# Patient Record
Sex: Male | Born: 1965 | Race: White | Hispanic: No | Marital: Married | Smoking: Former smoker
Health system: Southern US, Community
[De-identification: ages and names within clinical notes are randomized; demographics above are authoritative.]

## PROBLEM LIST (undated history)

## (undated) DIAGNOSIS — G4730 Sleep apnea, unspecified: Secondary | ICD-10-CM

## (undated) DIAGNOSIS — I10 Essential (primary) hypertension: Secondary | ICD-10-CM

## (undated) DIAGNOSIS — E1142 Type 2 diabetes mellitus with diabetic polyneuropathy: Secondary | ICD-10-CM

## (undated) DIAGNOSIS — N401 Enlarged prostate with lower urinary tract symptoms: Secondary | ICD-10-CM

## (undated) DIAGNOSIS — E785 Hyperlipidemia, unspecified: Secondary | ICD-10-CM

## (undated) DIAGNOSIS — M2041 Other hammer toe(s) (acquired), right foot: Secondary | ICD-10-CM

## (undated) DIAGNOSIS — I739 Peripheral vascular disease, unspecified: Secondary | ICD-10-CM

## (undated) DIAGNOSIS — E11628 Type 2 diabetes mellitus with other skin complications: Secondary | ICD-10-CM

## (undated) DIAGNOSIS — N529 Male erectile dysfunction, unspecified: Secondary | ICD-10-CM

## (undated) DIAGNOSIS — E669 Obesity, unspecified: Secondary | ICD-10-CM

## (undated) DIAGNOSIS — I890 Lymphedema, not elsewhere classified: Secondary | ICD-10-CM

## (undated) DIAGNOSIS — M1990 Unspecified osteoarthritis, unspecified site: Secondary | ICD-10-CM

## (undated) DIAGNOSIS — E119 Type 2 diabetes mellitus without complications: Secondary | ICD-10-CM

## (undated) HISTORY — DX: Unspecified osteoarthritis, unspecified site: M19.90

## (undated) HISTORY — DX: Type 2 diabetes mellitus without complications: E11.9

## (undated) HISTORY — DX: Hyperlipidemia, unspecified: E78.5

## (undated) HISTORY — DX: Essential (primary) hypertension: I10

## (undated) HISTORY — PX: VASCULAR SURGERY: SHX849

---

## 2014-12-27 ENCOUNTER — Other Ambulatory Visit: Payer: Self-pay | Admitting: Family Medicine

## 2014-12-27 NOTE — Telephone Encounter (Signed)
Last seen in March was due back in 50months. Forward to [employer] who is covering for [employer]

## 2014-12-27 NOTE — Telephone Encounter (Signed)
Pt needs seen.

## 2015-01-16 DIAGNOSIS — I10 Essential (primary) hypertension: Secondary | ICD-10-CM | POA: Insufficient documentation

## 2015-01-16 DIAGNOSIS — E119 Type 2 diabetes mellitus without complications: Secondary | ICD-10-CM

## 2015-01-16 DIAGNOSIS — E785 Hyperlipidemia, unspecified: Secondary | ICD-10-CM | POA: Insufficient documentation

## 2015-01-16 DIAGNOSIS — E1169 Type 2 diabetes mellitus with other specified complication: Secondary | ICD-10-CM | POA: Insufficient documentation

## 2015-01-17 ENCOUNTER — Ambulatory Visit (INDEPENDENT_AMBULATORY_CARE_PROVIDER_SITE_OTHER): Payer: Managed Care, Other (non HMO) | Admitting: Family Medicine

## 2015-01-17 VITALS — BP 132/81 | HR 68 | Temp 98.3°F | Resp 16 | Ht 72.0 in | Wt 319.0 lb

## 2015-01-17 DIAGNOSIS — I10 Essential (primary) hypertension: Secondary | ICD-10-CM

## 2015-01-17 DIAGNOSIS — E785 Hyperlipidemia, unspecified: Secondary | ICD-10-CM | POA: Diagnosis not present

## 2015-01-17 DIAGNOSIS — E119 Type 2 diabetes mellitus without complications: Secondary | ICD-10-CM

## 2015-01-18 ENCOUNTER — Encounter: Payer: Self-pay | Admitting: Family Medicine

## 2015-01-18 LAB — BAYER DCA HB A1C WAIVED: HB A1C: 6.4 % (ref <7.0)

## 2015-01-18 MED ORDER — CARVEDILOL 25 MG PO TABS: 25.0000 mg | ORAL_TABLET | Freq: Two times a day (BID) | ORAL | Status: DC

## 2015-01-18 MED ORDER — SITAGLIPTIN PHOSPHATE 100 MG PO TABS: 100.0000 mg | ORAL_TABLET | Freq: Every day | ORAL | Status: DC

## 2015-01-18 MED ORDER — BENAZEPRIL HCL 40 MG PO TABS: 40.0000 mg | ORAL_TABLET | Freq: Every day | ORAL | Status: DC

## 2015-01-18 MED ORDER — ATORVASTATIN CALCIUM 20 MG PO TABS: 20.0000 mg | ORAL_TABLET | Freq: Every day | ORAL | Status: DC

## 2015-01-18 MED ORDER — DAPAGLIFLOZIN PROPANEDIOL 10 MG PO TABS: 10.0000 mg | ORAL_TABLET | Freq: Every day | ORAL | Status: DC

## 2015-01-18 MED ORDER — METFORMIN HCL 500 MG PO TABS: 1000.0000 mg | ORAL_TABLET | Freq: Two times a day (BID) | ORAL | Status: DC

## 2015-01-18 MED ORDER — AMLODIPINE BESYLATE 5 MG PO TABS: 5.0000 mg | ORAL_TABLET | Freq: Every day | ORAL | Status: DC

## 2015-01-18 NOTE — Progress Notes (Signed)
   BP 132/81 mmHg  Pulse 68  Temp(Src) 98.3 F (36.8 C)  Resp 16  Ht 6' (1.829 m)  Wt 319 lb (144.697 kg)  BMI 43.25 kg/m2  SpO2 99%   Subjective:    Patient ID: Cody Burke, male    DOB: [birth year], 49 y.o.   MRN: [id number]  HPI: Cody Burke is a 49 y.o. male  No chief complaint on file.  Patient follow-up diabetes complaints noted low blood sugar spells is been doing well no complaints of tinea changes. Blood pressures been doing well Cholesterols been doing well no complaints medications takes faithfully without problems or side effects Relevant past medical, surgical, family and social history reviewed and updated as indicated. Interim medical history since our last visit reviewed. Allergies and medications reviewed and updated.  Review of Systems  Constitutional: Negative.   Respiratory: Negative.   Cardiovascular: Negative.     Per HPI unless specifically indicated above     Objective:    BP 132/81 mmHg  Pulse 68  Temp(Src) 98.3 F (36.8 C)  Resp 16  Ht 6' (1.829 m)  Wt 319 lb (144.697 kg)  BMI 43.25 kg/m2  SpO2 99%  Wt Readings from Last 3 Encounters:  01/18/15 319 lb (144.697 kg)  08/09/14 318 lb (144.244 kg)    Physical Exam  Constitutional: He is oriented to person, place, and time. He appears well-developed and well-nourished. No distress.  HENT:  Head: Normocephalic and atraumatic.  Right Ear: Hearing normal.  Left Ear: Hearing normal.  Nose: Nose normal.  Eyes: Conjunctivae and lids are normal. Right eye exhibits no discharge. Left eye exhibits no discharge. No scleral icterus.  Pulmonary/Chest: Effort normal. No respiratory distress.  Musculoskeletal: Normal range of motion.  Neurological: He is alert and oriented to person, place, and time.  Skin: Skin is intact. No rash noted.  Psychiatric: He has a normal mood and affect. His speech is normal and behavior is normal. Judgment and thought content normal. Cognition and memory are normal.     No results found for this or any previous visit.    Assessment & Plan:   Problem List Items Addressed This Visit      Cardiovascular and Mediastinum   Hypertension - Primary    The current medical regimen is effective;  continue present plan and medications.       Relevant Medications   carvedilol (COREG) 25 MG tablet   benazepril (LOTENSIN) 40 MG tablet   atorvastatin (LIPITOR) 20 MG tablet   amLODipine (NORVASC) 5 MG tablet     Endocrine   Diabetes mellitus without complication    The current medical regimen is effective;  continue present plan and medications.       Relevant Medications   sitaGLIPtin (JANUVIA) 100 MG tablet   metFORMIN (GLUCOPHAGE) 500 MG tablet   dapagliflozin propanediol (FARXIGA) 10 MG TABS tablet   benazepril (LOTENSIN) 40 MG tablet   atorvastatin (LIPITOR) 20 MG tablet     Other   Hyperlipidemia    The current medical regimen is effective;  continue present plan and medications.       Relevant Medications   carvedilol (COREG) 25 MG tablet   benazepril (LOTENSIN) 40 MG tablet   atorvastatin (LIPITOR) 20 MG tablet   amLODipine (NORVASC) 5 MG tablet       Follow up plan: Return in about 3 months (around 04/19/2015) for Physical Exam and a1c.

## 2015-01-18 NOTE — Assessment & Plan Note (Signed)
The current medical regimen is effective;  continue present plan and medications.  

## 2015-01-18 NOTE — Addendum Note (Signed)
Addended byGolden Pop on: 01/18/2015 08:04 AM   Modules accepted: Orders

## 2015-04-06 ENCOUNTER — Ambulatory Visit: Payer: Managed Care, Other (non HMO)

## 2015-04-06 DIAGNOSIS — Z23 Encounter for immunization: Secondary | ICD-10-CM

## 2015-07-30 ENCOUNTER — Ambulatory Visit (INDEPENDENT_AMBULATORY_CARE_PROVIDER_SITE_OTHER): Payer: Managed Care, Other (non HMO) | Admitting: Family Medicine

## 2015-07-30 ENCOUNTER — Encounter: Payer: Self-pay | Admitting: Family Medicine

## 2015-07-30 VITALS — BP 138/86 | HR 69 | Temp 99.0°F | Ht 72.2 in | Wt 312.0 lb

## 2015-07-30 DIAGNOSIS — I10 Essential (primary) hypertension: Secondary | ICD-10-CM | POA: Diagnosis not present

## 2015-07-30 DIAGNOSIS — Z0000 Encounter for general adult medical examination without abnormal findings: Secondary | ICD-10-CM

## 2015-07-30 DIAGNOSIS — R5382 Chronic fatigue, unspecified: Secondary | ICD-10-CM

## 2015-07-30 DIAGNOSIS — E785 Hyperlipidemia, unspecified: Secondary | ICD-10-CM

## 2015-07-30 DIAGNOSIS — E119 Type 2 diabetes mellitus without complications: Secondary | ICD-10-CM

## 2015-07-30 DIAGNOSIS — Z1211 Encounter for screening for malignant neoplasm of colon: Secondary | ICD-10-CM

## 2015-07-30 DIAGNOSIS — R5383 Other fatigue: Secondary | ICD-10-CM | POA: Insufficient documentation

## 2015-07-30 DIAGNOSIS — Z23 Encounter for immunization: Secondary | ICD-10-CM | POA: Diagnosis not present

## 2015-07-30 DIAGNOSIS — Z113 Encounter for screening for infections with a predominantly sexual mode of transmission: Secondary | ICD-10-CM

## 2015-07-30 LAB — URINALYSIS, ROUTINE W REFLEX MICROSCOPIC
Bilirubin, UA: NEGATIVE
Ketones, UA: NEGATIVE
Leukocytes, UA: NEGATIVE
NITRITE UA: NEGATIVE
PH UA: 5 (ref 5.0–7.5)
Protein, UA: NEGATIVE
Specific Gravity, UA: 1.015 (ref 1.005–1.030)
UUROB: 0.2 mg/dL (ref 0.2–1.0)

## 2015-07-30 LAB — MICROSCOPIC EXAMINATION: WBC UA: NONE SEEN /HPF (ref 0–?)

## 2015-07-30 LAB — BAYER DCA HB A1C WAIVED: HB A1C: 6.9 % (ref <7.0)

## 2015-07-30 MED ORDER — ATORVASTATIN CALCIUM 20 MG PO TABS: 20.0000 mg | ORAL_TABLET | Freq: Every day | ORAL | Status: DC

## 2015-07-30 MED ORDER — METFORMIN HCL 500 MG PO TABS: 1000.0000 mg | ORAL_TABLET | Freq: Two times a day (BID) | ORAL | Status: DC

## 2015-07-30 MED ORDER — BENAZEPRIL HCL 40 MG PO TABS: 40.0000 mg | ORAL_TABLET | Freq: Every day | ORAL | Status: DC

## 2015-07-30 NOTE — Progress Notes (Signed)
BP 138/86 mmHg  Pulse 69  Temp(Src) 99 F (37.2 C)  Ht 6' 0.2" (1.834 m)  Wt 312 lb (141.522 kg)  BMI 42.08 kg/m2  SpO2 99%   Subjective:    Patient ID: Cody Burke, male    DOB: 10-Oct-1965, 50 y.o.   MRN: [id number]  HPI: Cody Burke is a 50 y.o. male  Chief Complaint  Patient presents with  . Annual Exam   patient follow-up for physical been doing well concerned about fatigue has been working 7 days a week since November gets limited sleep of around 7 hours at night eats okay Has been working to modify his diet and is lost 7 pounds No problems with medications No issues with medications no low blood sugar spells takes medicines faithfully Concerned about low testosterone may be contributing to his fatigue  Relevant past medical, surgical, family and social history reviewed and updated as indicated. Interim medical history since our last visit reviewed. Allergies and medications reviewed and updated.  Review of Systems  Constitutional: Negative.   HENT: Negative.   Eyes: Negative.   Respiratory: Negative.   Cardiovascular: Negative.   Gastrointestinal: Negative.   Endocrine: Negative.   Genitourinary: Negative.   Musculoskeletal: Negative.   Skin: Negative.   Allergic/Immunologic: Negative.   Neurological: Negative.   Hematological: Negative.   Psychiatric/Behavioral: Negative.     Per HPI unless specifically indicated above     Objective:    BP 138/86 mmHg  Pulse 69  Temp(Src) 99 F (37.2 C)  Ht 6' 0.2" (1.834 m)  Wt 312 lb (141.522 kg)  BMI 42.08 kg/m2  SpO2 99%  Wt Readings from Last 3 Encounters:  07/30/15 312 lb (141.522 kg)  01/18/15 319 lb (144.697 kg)  08/09/14 318 lb (144.244 kg)    Physical Exam  Constitutional: He is oriented to person, place, and time. He appears well-developed and well-nourished.  HENT:  Head: Normocephalic and atraumatic.  Right Ear: External ear normal.  Left Ear: External ear normal.  Eyes: Conjunctivae and EOM  are normal. Pupils are equal, round, and reactive to light.  Neck: Normal range of motion. Neck supple.  Cardiovascular: Normal rate, regular rhythm, normal heart sounds and intact distal pulses.   Pulmonary/Chest: Effort normal and breath sounds normal.  Abdominal: Soft. Bowel sounds are normal. There is no splenomegaly or hepatomegaly.  Genitourinary: Rectum normal, prostate normal and penis normal.  Musculoskeletal: Normal range of motion.  Neurological: He is alert and oriented to person, place, and time. He has normal reflexes.  Skin: No rash noted. No erythema.  Psychiatric: He has a normal mood and affect. His behavior is normal. Judgment and thought content normal.    Results for orders placed or performed in visit on 01/17/15  Bayer DCA Hb A1c Waived  Result Value Ref Range   Bayer DCA Hb A1c Waived 6.4 <7.0 %      Assessment & Plan:   Problem List Items Addressed This Visit      Cardiovascular and Mediastinum   Hypertension    Discussed blood pressure on edge of being okay we'll continue working with diet exercise nutrition weight loss Patient also taking only Coreg discuss switch to Benzapril for renal protection      Relevant Medications   benazepril (LOTENSIN) 40 MG tablet   atorvastatin (LIPITOR) 20 MG tablet     Endocrine   Diabetes mellitus without complication ([place])    Not taking Januvia only taking metformin and [place]  Relevant Medications   benazepril (LOTENSIN) 40 MG tablet   metFORMIN (GLUCOPHAGE) 500 MG tablet   atorvastatin (LIPITOR) 20 MG tablet   Other Relevant Orders   Bayer DCA Hb A1c Waived     Other   Hyperlipidemia    Not taking Lipitor Labs pending      Relevant Medications   benazepril (LOTENSIN) 40 MG tablet   atorvastatin (LIPITOR) 20 MG tablet   Fatigue    Discuss fatigue Discuss adequate sleep Discuss observing for sleep apnea as patient at risk for sleep apnea patient will get his wife to observe for breathing We'll  check testosterone level      Relevant Orders   Testosterone    Other Visit Diagnoses    Routine screening for STI (sexually transmitted infection)    -  Primary    Relevant Orders    Hepatitis C Antibody    Immunization due        Relevant Orders    Tdap vaccine greater than or equal to 7yo IM (Completed)    Colon cancer screening        Relevant Orders    Ambulatory referral to General Surgery    PE (physical exam), annual        Relevant Orders    Comprehensive metabolic panel    Lipid panel    CBC with Differential/Platelet    TSH    Urinalysis, Routine w reflex microscopic (not at [facility])    PSA        Follow up plan: Return in about 3 months (around 10/30/2015) for Med check and hemoglobin A1c.

## 2015-07-30 NOTE — Assessment & Plan Note (Signed)
Discuss fatigue Discuss adequate sleep Discuss observing for sleep apnea as patient at risk for sleep apnea patient will get his wife to observe for breathing We'll check testosterone level

## 2015-07-30 NOTE — Assessment & Plan Note (Signed)
Not taking Januvia only taking metformin and [place]

## 2015-07-30 NOTE — Assessment & Plan Note (Addendum)
Not taking Lipitor Labs pending

## 2015-07-30 NOTE — Assessment & Plan Note (Addendum)
Discussed blood pressure on edge of being okay we'll continue working with diet exercise nutrition weight loss Patient also taking only Coreg discuss switch to Benzapril for renal protection

## 2015-07-31 LAB — CBC WITH DIFFERENTIAL/PLATELET
Basophils Absolute: 0.1 10*3/uL (ref 0.0–0.2)
Basos: 0 %
EOS (ABSOLUTE): 0.3 10*3/uL (ref 0.0–0.4)
Eos: 3 %
HEMATOCRIT: 52.3 % — AB (ref 37.5–51.0)
HEMOGLOBIN: 17.8 g/dL — AB (ref 12.6–17.7)
IMMATURE GRANS (ABS): 0.1 10*3/uL (ref 0.0–0.1)
IMMATURE GRANULOCYTES: 1 %
Lymphocytes Absolute: 4.7 10*3/uL — ABNORMAL HIGH (ref 0.7–3.1)
Lymphs: 41 %
MCH: 30.1 pg (ref 26.6–33.0)
MCHC: 34 g/dL (ref 31.5–35.7)
MCV: 88 fL (ref 79–97)
MONOCYTES: 7 %
MONOS ABS: 0.9 10*3/uL (ref 0.1–0.9)
NEUTROS ABS: 5.5 10*3/uL (ref 1.4–7.0)
Neutrophils: 48 %
Platelets: 230 10*3/uL (ref 150–379)
RBC: 5.92 x10E6/uL — ABNORMAL HIGH (ref 4.14–5.80)
RDW: 14.3 % (ref 12.3–15.4)
WBC: 11.6 10*3/uL — AB (ref 3.4–10.8)

## 2015-07-31 LAB — LIPID PANEL
CHOL/HDL RATIO: 4.7 ratio (ref 0.0–5.0)
Cholesterol, Total: 179 mg/dL (ref 100–199)
HDL: 38 mg/dL — AB (ref >39)
LDL CALC: 94 mg/dL (ref 0–99)
TRIGLYCERIDES: 237 mg/dL — AB (ref 0–149)
VLDL Cholesterol Cal: 47 mg/dL — ABNORMAL HIGH (ref 5–40)

## 2015-07-31 LAB — TESTOSTERONE: TESTOSTERONE: 249 ng/dL — AB (ref 348–1197)

## 2015-07-31 LAB — COMPREHENSIVE METABOLIC PANEL
ALT: 35 IU/L (ref 0–44)
AST: 22 IU/L (ref 0–40)
Albumin/Globulin Ratio: 1.4 (ref 1.1–2.5)
Albumin: 4.1 g/dL (ref 3.5–5.5)
Alkaline Phosphatase: 102 IU/L (ref 39–117)
BUN/Creatinine Ratio: 18 (ref 9–20)
BUN: 16 mg/dL (ref 6–24)
Bilirubin Total: 0.4 mg/dL (ref 0.0–1.2)
CO2: 22 mmol/L (ref 18–29)
CREATININE: 0.91 mg/dL (ref 0.76–1.27)
Calcium: 9 mg/dL (ref 8.7–10.2)
Chloride: 98 mmol/L (ref 96–106)
GFR calc Af Amer: 112 mL/min/{1.73_m2} (ref >59)
GFR, EST NON AFRICAN AMERICAN: 97 mL/min/{1.73_m2} (ref >59)
GLOBULIN, TOTAL: 2.9 g/dL (ref 1.5–4.5)
Glucose: 155 mg/dL — ABNORMAL HIGH (ref 65–99)
Potassium: 4.9 mmol/L (ref 3.5–5.2)
SODIUM: 139 mmol/L (ref 134–144)
Total Protein: 7 g/dL (ref 6.0–8.5)

## 2015-07-31 LAB — TSH: TSH: 2.41 u[IU]/mL (ref 0.450–4.500)

## 2015-07-31 LAB — HEPATITIS C ANTIBODY: Hep C Virus Ab: <0.1 s/co ratio (ref 0.0–0.9)

## 2015-07-31 LAB — PSA: Prostate Specific Ag, Serum: <0.1 ng/mL (ref 0.0–4.0)

## 2015-08-01 ENCOUNTER — Telehealth: Payer: Self-pay | Admitting: Family Medicine

## 2015-08-01 DIAGNOSIS — D582 Other hemoglobinopathies: Secondary | ICD-10-CM

## 2015-08-01 NOTE — Telephone Encounter (Signed)
Phone call Discussed with patient elevated blood count patient nonsmoker no other pulmonary type toxicities On further review about sleep apnea with patient patient does have classic signs and symptoms of sleep apnea will arrange for sleep apnea evaluation And chest x-ray

## 2015-08-01 NOTE — Telephone Encounter (Signed)
-----   Message from Wynn Maudlin, [place] sent at 08/01/2015  5:14 PM EST ----- labs

## 2015-08-01 NOTE — Telephone Encounter (Signed)
Pt would like a back. He stated that he had missed phone calls and was concerned. He would like a call back a little after 12 or after 3:30(he gets off work). Thanks

## 2015-08-02 ENCOUNTER — Ambulatory Visit
Admission: RE | Admit: 2015-08-02 | Discharge: 2015-08-02 | Disposition: A | Discharge status: Home / Self Care | Payer: Managed Care, Other (non HMO) | Source: Ambulatory Visit | Attending: Family Medicine | Admitting: Family Medicine

## 2015-08-02 DIAGNOSIS — D582 Other hemoglobinopathies: Secondary | ICD-10-CM

## 2015-08-03 ENCOUNTER — Other Ambulatory Visit: Payer: Self-pay

## 2015-08-03 ENCOUNTER — Telehealth: Payer: Self-pay

## 2015-08-03 NOTE — Telephone Encounter (Signed)
Gastroenterology Pre-Procedure Review  Request Date: 09/21/15 Requesting Physician: Dr. Jeananne Rama  PATIENT REVIEW QUESTIONS: The patient responded to the following health history questions as indicated:    1. Are you having any GI issues? no 2. Do you have a personal history of Polyps? no 3. Do you have a family history of Colon Cancer or Polyps? no 4. Diabetes Mellitus? no 5. Joint replacements in the past 12 months?no 6. Major health problems in the past 3 months?no 7. Any artificial heart valves, MVP, or defibrillator?no    MEDICATIONS & ALLERGIES:    Patient reports the following regarding taking any anticoagulation/antiplatelet therapy:   Plavix, Coumadin, Eliquis, Xarelto, Lovenox, Pradaxa, Brilinta, or Effient? no Aspirin? no  Patient confirms/reports the following medications:  Current Outpatient Prescriptions  Medication Sig Dispense Refill  . aspirin EC 81 MG tablet Take 81 mg by mouth daily.    Marland Kitchen atorvastatin (LIPITOR) 20 MG tablet Take 1 tablet (20 mg total) by mouth daily. 90 tablet 4  . benazepril (LOTENSIN) 40 MG tablet Take 1 tablet (40 mg total) by mouth daily. 90 tablet 4  . dapagliflozin propanediol (FARXIGA) 10 MG TABS tablet Take 10 mg by mouth daily. 30 tablet 12  . metFORMIN (GLUCOPHAGE) 500 MG tablet Take 2 tablets (1,000 mg total) by mouth 2 (two) times daily with a meal. 360 tablet 4   No current facility-administered medications for this visit.    Patient confirms/reports the following allergies:  No Known Allergies  No orders of the defined types were placed in this encounter.    AUTHORIZATION INFORMATION Primary Insurance: 1D#: Group #:  Secondary Insurance: 1D#: Group #:  SCHEDULE INFORMATION: Date: 09/21/15 Time: Location: [place]

## 2015-08-06 ENCOUNTER — Encounter: Payer: Self-pay | Admitting: Family Medicine

## 2015-09-03 ENCOUNTER — Telehealth: Payer: Self-pay

## 2015-09-03 NOTE — Telephone Encounter (Signed)
Call patient about Sleep Study

## 2015-09-03 NOTE — Telephone Encounter (Signed)
Phone call Discussed with patient need diagnosis of severe sleep apnea Patient will need follow-up sleep titration study will schedule.

## 2015-09-14 ENCOUNTER — Encounter: Payer: Self-pay | Admitting: *Deleted

## 2015-09-20 NOTE — Discharge Instructions (Signed)

## 2015-09-21 ENCOUNTER — Ambulatory Visit
Admission: RE | Admit: 2015-09-21 | Discharge: 2015-09-21 | Disposition: A | Discharge status: Home / Self Care | Payer: Managed Care, Other (non HMO) | Source: Ambulatory Visit | Attending: Gastroenterology | Admitting: Gastroenterology

## 2015-09-21 ENCOUNTER — Ambulatory Visit: Payer: Managed Care, Other (non HMO) | Admitting: Anesthesiology

## 2015-09-21 ENCOUNTER — Encounter
Admission: RE | Disposition: A | Discharge status: Home / Self Care | Payer: Self-pay | Source: Ambulatory Visit | Attending: Gastroenterology

## 2015-09-21 DIAGNOSIS — Z79899 Other long term (current) drug therapy: Secondary | ICD-10-CM | POA: Insufficient documentation

## 2015-09-21 DIAGNOSIS — Z8249 Family history of ischemic heart disease and other diseases of the circulatory system: Secondary | ICD-10-CM | POA: Insufficient documentation

## 2015-09-21 DIAGNOSIS — K641 Second degree hemorrhoids: Secondary | ICD-10-CM | POA: Diagnosis not present

## 2015-09-21 DIAGNOSIS — Z7982 Long term (current) use of aspirin: Secondary | ICD-10-CM | POA: Insufficient documentation

## 2015-09-21 DIAGNOSIS — D123 Benign neoplasm of transverse colon: Secondary | ICD-10-CM | POA: Diagnosis not present

## 2015-09-21 DIAGNOSIS — Z7984 Long term (current) use of oral hypoglycemic drugs: Secondary | ICD-10-CM | POA: Insufficient documentation

## 2015-09-21 DIAGNOSIS — E785 Hyperlipidemia, unspecified: Secondary | ICD-10-CM | POA: Diagnosis not present

## 2015-09-21 DIAGNOSIS — E119 Type 2 diabetes mellitus without complications: Secondary | ICD-10-CM | POA: Diagnosis not present

## 2015-09-21 DIAGNOSIS — Z1211 Encounter for screening for malignant neoplasm of colon: Secondary | ICD-10-CM

## 2015-09-21 DIAGNOSIS — Z87891 Personal history of nicotine dependence: Secondary | ICD-10-CM | POA: Diagnosis not present

## 2015-09-21 DIAGNOSIS — I10 Essential (primary) hypertension: Secondary | ICD-10-CM | POA: Diagnosis not present

## 2015-09-21 DIAGNOSIS — G4730 Sleep apnea, unspecified: Secondary | ICD-10-CM | POA: Insufficient documentation

## 2015-09-21 DIAGNOSIS — I739 Peripheral vascular disease, unspecified: Secondary | ICD-10-CM | POA: Diagnosis not present

## 2015-09-21 HISTORY — DX: Sleep apnea, unspecified: G47.30

## 2015-09-21 HISTORY — PX: COLONOSCOPY WITH PROPOFOL: SHX5780

## 2015-09-21 HISTORY — DX: Peripheral vascular disease, unspecified: I73.9

## 2015-09-21 LAB — GLUCOSE, CAPILLARY: GLUCOSE-CAPILLARY: 138 mg/dL — AB (ref 65–99)

## 2015-09-21 SURGERY — COLONOSCOPY WITH PROPOFOL
Anesthesia: Monitor Anesthesia Care

## 2015-09-21 MED ORDER — LIDOCAINE HCL (CARDIAC) 20 MG/ML IV SOLN
INTRAVENOUS | Status: DC | PRN
  Administered 2015-09-21: 50 mg via INTRAVENOUS

## 2015-09-21 MED ORDER — LACTATED RINGERS IV SOLN
INTRAVENOUS | Status: DC
  Administered 2015-09-21: 08:00:00 via INTRAVENOUS

## 2015-09-21 MED ORDER — PROPOFOL 10 MG/ML IV BOLUS
INTRAVENOUS | Status: DC | PRN
  Administered 2015-09-21 (×5): 50 mg via INTRAVENOUS
  Administered 2015-09-21: 100 mg via INTRAVENOUS

## 2015-09-21 MED ORDER — STERILE WATER FOR IRRIGATION IR SOLN
Status: DC | PRN
  Administered 2015-09-21: 08:00:00

## 2015-09-21 SURGICAL SUPPLY — 22 items
CANISTER SUCT 1200ML W/VALVE (MISCELLANEOUS) ×3 IMPLANT
CLIP HMST 235XBRD CATH ROT (MISCELLANEOUS) IMPLANT
CLIP RESOLUTION 360 11X235 (MISCELLANEOUS)
FCP ESCP3.2XJMB 240X2.8X (MISCELLANEOUS)
FORCEPS BIOP RAD 4 LRG CAP 4 (CUTTING FORCEPS) IMPLANT
FORCEPS BIOP RJ4 240 W/NDL (MISCELLANEOUS)
FORCEPS ESCP3.2XJMB 240X2.8X (MISCELLANEOUS) IMPLANT
GOWN CVR UNV OPN BCK APRN NK (MISCELLANEOUS) ×2 IMPLANT
GOWN ISOL THUMB LOOP REG UNIV (MISCELLANEOUS) ×4
INJECTOR VARIJECT VIN23 (MISCELLANEOUS) IMPLANT
KIT DEFENDO VALVE AND CONN (KITS) IMPLANT
KIT ENDO PROCEDURE OLY (KITS) ×3 IMPLANT
MARKER SPOT ENDO TATTOO 5ML (MISCELLANEOUS) IMPLANT
PAD GROUND ADULT SPLIT (MISCELLANEOUS) IMPLANT
PROBE APC STR FIRE (PROBE) IMPLANT
SNARE SHORT THROW 13M SML OVAL (MISCELLANEOUS) IMPLANT
SNARE SHORT THROW 30M LRG OVAL (MISCELLANEOUS) ×3 IMPLANT
SNARE SNG USE RND 15MM (INSTRUMENTS) IMPLANT
SPOT EX ENDOSCOPIC TATTOO (MISCELLANEOUS)
TRAP ETRAP POLY (MISCELLANEOUS) IMPLANT
VARIJECT INJECTOR VIN23 (MISCELLANEOUS)
WATER STERILE IRR 250ML POUR (IV SOLUTION) ×3 IMPLANT

## 2015-09-21 NOTE — Transfer of Care (Signed)
Immediate Anesthesia Transfer of Care Note  Patient: Cody Burke  Procedure(s) Performed: Procedure(s) with comments: COLONOSCOPY WITH PROPOFOL (N/A) - Diabetic - oral meds sleep apnea  Patient Location: PACU  Anesthesia Type: MAC  Level of Consciousness: awake, alert  and patient cooperative  Airway and Oxygen Therapy: Patient Spontanous Breathing and Patient connected to supplemental oxygen  Post-op Assessment: Post-op Vital signs reviewed, Patient's Cardiovascular Status Stable, Respiratory Function Stable, Patent Airway and No signs of Nausea or vomiting  Post-op Vital Signs: Reviewed and stable  Complications: No apparent anesthesia complications

## 2015-09-21 NOTE — H&P (Signed)
  [facility]  [address], [place], [place] [address] Phone: [phone] Fax : [phone]  Primary Care Physician:  Golden Pop, MD Primary Gastroenterologist:  Dr. Allen Norris  Pre-Procedure History & Physical: HPI:  Cody Burke is a 50 y.o. male is here for a screening colonoscopy.   Past Medical History  Diagnosis Date  . Diabetes mellitus without complication ([place])   . Hyperlipidemia   . Hypertension   . Sleep apnea     severe - new DX - has not gotten CPAP yet  . Peripheral vascular disease [facility])     Past Surgical History  Procedure Laterality Date  . Vascular surgery Bilateral     legs    Prior to Admission medications   Medication Sig Start Date End Date Taking? Authorizing Provider  aspirin EC 81 MG tablet Take 81 mg by mouth daily.   Yes Historical Provider, MD  atorvastatin (LIPITOR) 20 MG tablet Take 1 tablet (20 mg total) by mouth daily. 07/30/15  Yes Guadalupe Maple, MD  benazepril (LOTENSIN) 40 MG tablet Take 1 tablet (40 mg total) by mouth daily. 07/30/15  Yes Guadalupe Maple, MD  dapagliflozin propanediol (FARXIGA) 10 MG TABS tablet Take 10 mg by mouth daily. 01/18/15  Yes Guadalupe Maple, MD  metFORMIN (GLUCOPHAGE) 500 MG tablet Take 2 tablets (1,000 mg total) by mouth 2 (two) times daily with a meal. 07/30/15  Yes Guadalupe Maple, MD  Multiple Vitamins-Minerals (MULTIVITAMIN GUMMIES ADULT PO) Take by mouth daily.   Yes Historical Provider, MD    Allergies as of 08/03/2015  . (No Known Allergies)    Family History  Problem Relation Age of Onset  . Cancer Mother   . Hypertension Father     Social History   Social History  . Marital Status: Unknown    Spouse Name: N/A  . Number of Children: N/A  . Years of Education: N/A   Occupational History  . Not on file.   Social History Main Topics  . Smoking status: Former Smoker    Types: Cigarettes    Quit date: 07/30/2003  . Smokeless tobacco: Never Used  . Alcohol Use: Yes   Comment: very little  . Drug Use: No  . Sexual Activity: Not on file   Other Topics Concern  . Not on file   Social History Narrative    Review of Systems: See HPI, otherwise negative ROS  Physical Exam: BP 148/94 mmHg  Pulse 70  Temp(Src) 97.7 F (36.5 C) (Temporal)  Ht 6\' 3"  (1.905 m)  Wt 310 lb (140.615 kg)  BMI 38.75 kg/m2  SpO2 98% General:   Alert,  pleasant and cooperative in NAD Head:  Normocephalic and atraumatic. Neck:  Supple; no masses or thyromegaly. Lungs:  Clear throughout to auscultation.    Heart:  Regular rate and rhythm. Abdomen:  Soft, nontender and nondistended. Normal bowel sounds, without guarding, and without rebound.   Neurologic:  Alert and  oriented x4;  grossly normal neurologically.  Impression/Plan: Cody Burke is now here to undergo a screening colonoscopy.  Risks, benefits, and alternatives regarding colonoscopy have been reviewed with the patient.  Questions have been answered.  All parties agreeable.

## 2015-09-21 NOTE — Anesthesia Postprocedure Evaluation (Signed)
Anesthesia Post Note  Patient: Cody Burke  Procedure(s) Performed: Procedure(s) (LRB): COLONOSCOPY WITH PROPOFOL (N/A)  Patient location during evaluation: PACU Anesthesia Type: MAC Level of consciousness: awake and alert and oriented Pain management: pain level controlled Vital Signs Assessment: post-procedure vital signs reviewed and stable Respiratory status: spontaneous breathing and nonlabored ventilation Cardiovascular status: stable Postop Assessment: no signs of nausea or vomiting and adequate PO intake Anesthetic complications: no    Estill Batten

## 2015-09-21 NOTE — Anesthesia Preprocedure Evaluation (Signed)
Anesthesia Evaluation  Patient identified by MRN, date of birth, ID band Patient awake    Reviewed: Allergy & Precautions, NPO status , Patient's Chart, lab work & pertinent test results  Airway Mallampati: II  TM Distance: >3 FB Neck ROM: Full    Dental no notable dental hx.    Pulmonary sleep apnea , former smoker,    Pulmonary exam normal        Cardiovascular hypertension, + Peripheral Vascular Disease  Normal cardiovascular exam     Neuro/Psych    GI/Hepatic negative GI ROS, Neg liver ROS,   Endo/Other  diabetes, Type 2, Oral Hypoglycemic Agents  Renal/GU      Musculoskeletal   Abdominal   Peds  Hematology negative hematology ROS (+)   Anesthesia Other Findings   Reproductive/Obstetrics                             Anesthesia Physical Anesthesia Plan  ASA: II  Anesthesia Plan: MAC   Post-op Pain Management:    Induction: Intravenous  Airway Management Planned:   Additional Equipment:   Intra-op Plan:   Post-operative Plan:   Informed Consent: I have reviewed the patients History and Physical, chart, labs and discussed the procedure including the risks, benefits and alternatives for the proposed anesthesia with the patient or authorized representative who has indicated his/her understanding and acceptance.     Plan Discussed with: CRNA  Anesthesia Plan Comments:         Anesthesia Quick Evaluation

## 2015-09-21 NOTE — Op Note (Signed)
[facility] Patient Name: Cody Burke Procedure Date: 09/21/2015 7:41 AM MRN: [id number] Account #: [id number] Date of Birth: March 20, 1966 Admit Type: Outpatient Age: 50 Room: [facility] OR ROOM 01 Gender: Male Note Status: Finalized Procedure:            Colonoscopy Indications:          Screening for colorectal malignant neoplasm Providers:            Lucilla Lame, MD Referring MD:         Guadalupe Maple, MD (Referring MD) Medicines:            Propofol per Anesthesia Complications:        No immediate complications. Procedure:            Pre-Anesthesia Assessment:                       - Prior to the procedure, a History and Physical was                        performed, and patient medications and allergies were                        reviewed. The patient's tolerance of previous                        anesthesia was also reviewed. The risks and benefits of                        the procedure and the sedation options and risks were                        discussed with the patient. All questions were                        answered, and informed consent was obtained. Prior                        Anticoagulants: The patient has taken no previous                        anticoagulant or antiplatelet agents. ASA Grade                        Assessment: II - A patient with mild systemic disease.                        After reviewing the risks and benefits, the patient was                        deemed in satisfactory condition to undergo the                        procedure.                       After obtaining informed consent, the colonoscope was                        passed under direct vision. Throughout the procedure,  the patient's blood pressure, pulse, and oxygen                        saturations were monitored continuously. The Olympus                        CF-HQ190L Colonoscope (S#. [id number] [id number]) was introduced               through the anus and advanced to the the cecum,                        identified by appendiceal orifice and ileocecal valve.                        The colonoscopy was performed without difficulty. The                        patient tolerated the procedure well. The quality of                        the bowel preparation was fair. Findings:      The perianal and digital rectal examinations were normal.      A 4 mm polyp was found in the transverse colon. The polyp was sessile.       The polyp was removed with a cold snare. Resection and retrieval were       complete.      Non-bleeding internal hemorrhoids were found during retroflexion. The       hemorrhoids were Grade II (internal hemorrhoids that prolapse but reduce       spontaneously). Impression:           - Preparation of the colon was fair.                       - One 4 mm polyp in the transverse colon, removed with                        a cold snare. Resected and retrieved.                       - Non-bleeding internal hemorrhoids. Recommendation:       - Await pathology results.                       - Repeat colonoscopy in 5 years if polyp adenoma and 10                        years if hyperplastic Procedure Code(s):    --- Professional ---                       [phone], Colonoscopy, flexible; with removal of tumor(s),                        polyp(s), or other lesion(s) by snare technique Diagnosis Code(s):    --- Professional ---                       Z12.11, Encounter for screening for malignant neoplasm  of colon                       D12.3, Benign neoplasm of transverse colon (hepatic                        flexure or splenic flexure) CPT copyright 2016 American Medical Association. All rights reserved. The codes documented in this report are preliminary and upon coder review may  be revised to meet current compliance requirements. Lucilla Lame, MD 09/21/2015 8:07:05 AM This report has  been signed electronically. Number of Addenda: 0 Note Initiated On: 09/21/2015 7:41 AM Scope Withdrawal Time: 0 hours 8 minutes 5 seconds  Total Procedure Duration: 0 hours 16 minutes 36 seconds       [facility]

## 2015-09-24 ENCOUNTER — Encounter: Payer: Self-pay | Admitting: Gastroenterology

## 2015-09-25 ENCOUNTER — Encounter: Payer: Self-pay | Admitting: Gastroenterology

## 2015-10-31 ENCOUNTER — Ambulatory Visit (INDEPENDENT_AMBULATORY_CARE_PROVIDER_SITE_OTHER): Payer: Managed Care, Other (non HMO) | Admitting: Family Medicine

## 2015-10-31 ENCOUNTER — Encounter: Payer: Self-pay | Admitting: Family Medicine

## 2015-10-31 VITALS — BP 116/77 | HR 81 | Temp 98.5°F | Ht 72.24 in | Wt 314.0 lb

## 2015-10-31 DIAGNOSIS — I10 Essential (primary) hypertension: Secondary | ICD-10-CM

## 2015-10-31 DIAGNOSIS — E119 Type 2 diabetes mellitus without complications: Secondary | ICD-10-CM

## 2015-10-31 DIAGNOSIS — E785 Hyperlipidemia, unspecified: Secondary | ICD-10-CM | POA: Diagnosis not present

## 2015-10-31 LAB — BAYER DCA HB A1C WAIVED: HB A1C (BAYER DCA - WAIVED): 7.1 % — ABNORMAL HIGH (ref <7.0)

## 2015-10-31 NOTE — Assessment & Plan Note (Signed)
The current medical regimen is effective;  continue present plan and medications.  

## 2015-10-31 NOTE — Progress Notes (Signed)
   BP 116/77 mmHg  Pulse 81  Temp(Src) 98.5 F (36.9 C)  Ht 6' 0.24" (1.835 m)  Wt 314 lb (142.429 kg)  BMI 42.30 kg/m2  SpO2 95%   Subjective:    Patient ID: Cody Burke, male    DOB: [birth year], 50 y.o.   MRN: [id number]  HPI: Cody Burke is a 50 y.o. male  Chief Complaint  Patient presents with  . Diabetes  Patient recheck diabetes has been eating ice cream and gained weight note low blood sugar spells no issues with medications but taking metformin in the morning not in the evening as he doesn't remember his evening metformin. Blood pressure doing well no complaints taking medicines faithfully Cholesterol doing well  Relevant past medical, surgical, family and social history reviewed and updated as indicated. Interim medical history since our last visit reviewed. Allergies and medications reviewed and updated.  Review of Systems  Constitutional: Negative.   Respiratory: Negative.   Cardiovascular: Negative.     Per HPI unless specifically indicated above     Objective:    BP 116/77 mmHg  Pulse 81  Temp(Src) 98.5 F (36.9 C)  Ht 6' 0.24" (1.835 m)  Wt 314 lb (142.429 kg)  BMI 42.30 kg/m2  SpO2 95%  Wt Readings from Last 3 Encounters:  10/31/15 314 lb (142.429 kg)  09/21/15 310 lb (140.615 kg)  07/30/15 312 lb (141.522 kg)    Physical Exam  Constitutional: He is oriented to person, place, and time. He appears well-developed and well-nourished. No distress.  HENT:  Head: Normocephalic and atraumatic.  Right Ear: Hearing normal.  Left Ear: Hearing normal.  Nose: Nose normal.  Eyes: Conjunctivae and lids are normal. Right eye exhibits no discharge. Left eye exhibits no discharge. No scleral icterus.  Cardiovascular: Normal rate, regular rhythm and normal heart sounds.   Pulmonary/Chest: Effort normal and breath sounds normal. No respiratory distress.  Musculoskeletal: Normal range of motion.  Neurological: He is alert and oriented to person, place, and time.   Skin: Skin is intact. No rash noted.  Psychiatric: He has a normal mood and affect. His speech is normal and behavior is normal. Judgment and thought content normal. Cognition and memory are normal.    Results for orders placed or performed in visit on 10/31/15  Bayer DCA Hb A1c Waived  Result Value Ref Range   Bayer DCA Hb A1c Waived 7.1 (H) <7.0 %      Assessment & Plan:   Problem List Items Addressed This Visit      Cardiovascular and Mediastinum   Hypertension    The current medical regimen is effective;  continue present plan and medications.         Endocrine   Diabetes mellitus without complication ([place]) - Primary    Discuss worsening control with worsening diet. Patient will redouble dietary efforts cut out ice cream start metformin in the evening      Relevant Orders   Bayer DCA Hb A1c Waived (Completed)     Other   Hyperlipidemia    The current medical regimen is effective;  continue present plan and medications.           Follow up plan: Return in about 3 months (around 01/31/2016) for a1c, lipids, alt, ast, bmp.

## 2015-10-31 NOTE — Assessment & Plan Note (Signed)
Discuss worsening control with worsening diet. Patient will redouble dietary efforts cut out ice cream start metformin in the evening

## 2016-01-31 ENCOUNTER — Encounter: Payer: Self-pay | Admitting: Family Medicine

## 2016-01-31 ENCOUNTER — Ambulatory Visit (INDEPENDENT_AMBULATORY_CARE_PROVIDER_SITE_OTHER): Payer: Managed Care, Other (non HMO) | Admitting: Family Medicine

## 2016-01-31 VITALS — BP 136/85 | HR 81 | Temp 98.2°F | Wt 314.0 lb

## 2016-01-31 DIAGNOSIS — E119 Type 2 diabetes mellitus without complications: Secondary | ICD-10-CM | POA: Diagnosis not present

## 2016-01-31 DIAGNOSIS — Z6841 Body Mass Index (BMI) 40.0 and over, adult: Secondary | ICD-10-CM | POA: Diagnosis not present

## 2016-01-31 DIAGNOSIS — I10 Essential (primary) hypertension: Secondary | ICD-10-CM

## 2016-01-31 DIAGNOSIS — E785 Hyperlipidemia, unspecified: Secondary | ICD-10-CM | POA: Diagnosis not present

## 2016-01-31 DIAGNOSIS — Z23 Encounter for immunization: Secondary | ICD-10-CM | POA: Diagnosis not present

## 2016-01-31 LAB — MICROALBUMIN, URINE WAIVED
Creatinine, Urine Waived: 50 mg/dL (ref 10–300)
Microalb, Ur Waived: 10 mg/L (ref 0–19)
Microalb/Creat Ratio: <30 mg/g (ref <30)

## 2016-01-31 LAB — BAYER DCA HB A1C WAIVED: HB A1C (BAYER DCA - WAIVED): 7.3 % — ABNORMAL HIGH (ref <7.0)

## 2016-01-31 LAB — LP+ALT+AST PICCOLO, WAIVED
ALT (SGPT) Piccolo, Waived: 37 U/L (ref 10–47)
AST (SGOT) Piccolo, Waived: 37 U/L (ref 11–38)
CHOL/HDL RATIO PICCOLO,WAIVE: 2.8 mg/dL
CHOLESTEROL PICCOLO, WAIVED: 124 mg/dL (ref <200)
HDL CHOL PICCOLO, WAIVED: 44 mg/dL — AB (ref >59)
LDL CHOL CALC PICCOLO WAIVED: 34 mg/dL (ref <100)
TRIGLYCERIDES PICCOLO,WAIVED: 231 mg/dL — AB (ref <150)
VLDL Chol Calc Piccolo,Waive: 46 mg/dL — ABNORMAL HIGH (ref <30)

## 2016-01-31 MED ORDER — DAPAGLIFLOZIN PROPANEDIOL 10 MG PO TABS: 10.0000 mg | ORAL_TABLET | Freq: Every day | ORAL | 12 refills | Status: DC

## 2016-01-31 NOTE — Assessment & Plan Note (Signed)
The current medical regimen is effective;  continue present plan and medications.  

## 2016-01-31 NOTE — Progress Notes (Signed)
BP 136/85 (BP Location: Left Arm, Patient Position: Sitting, Cuff Size: Normal)   Pulse 81   Temp 98.2 F (36.8 C)   Wt (!) 314 lb (142.4 kg) Comment: with shoes  SpO2 97%   BMI 42.30 kg/m    Subjective:    Patient ID: Cody Burke, male    DOB: [birth year], 50 y.o.   MRN: [id number]  HPI: Cody Burke is a 50 y.o. male  Chief Complaint  Patient presents with  . Diabetes  . Hypertension  . Hyperlipidemia  Patient all in all doing well no issues low blood sugar spells side effects from medications taking medications faithfully. Has questions about over-the-counter supplement reviewed supplement questions about [employer] Which seems to be okay on review of ingredients through Wikipedia Patient will consider doing a free trial  Relevant past medical, surgical, family and social history reviewed and updated as indicated. Interim medical history since our last visit reviewed. Allergies and medications reviewed and updated.  Review of Systems  Constitutional: Negative.   Respiratory: Negative.   Cardiovascular: Negative.     Per HPI unless specifically indicated above     Objective:    BP 136/85 (BP Location: Left Arm, Patient Position: Sitting, Cuff Size: Normal)   Pulse 81   Temp 98.2 F (36.8 C)   Wt (!) 314 lb (142.4 kg) Comment: with shoes  SpO2 97%   BMI 42.30 kg/m   Wt Readings from Last 3 Encounters:  01/31/16 (!) 314 lb (142.4 kg)  10/31/15 (!) 314 lb (142.4 kg)  09/21/15 (!) 310 lb (140.6 kg)    Physical Exam  Constitutional: He is oriented to person, place, and time. He appears well-developed and well-nourished. No distress.  HENT:  Head: Normocephalic and atraumatic.  Right Ear: Hearing normal.  Left Ear: Hearing normal.  Nose: Nose normal.  Eyes: Conjunctivae and lids are normal. Right eye exhibits no discharge. Left eye exhibits no discharge. No scleral icterus.  Cardiovascular: Normal rate, regular rhythm and normal heart sounds.   Pulmonary/Chest:  Effort normal and breath sounds normal. No respiratory distress.  Musculoskeletal: Normal range of motion.  Neurological: He is alert and oriented to person, place, and time.  Skin: Skin is intact. No rash noted.  Psychiatric: He has a normal mood and affect. His speech is normal and behavior is normal. Judgment and thought content normal. Cognition and memory are normal.    Results for orders placed or performed in visit on 10/31/15  Bayer DCA Hb A1c Waived  Result Value Ref Range   Bayer DCA Hb A1c Waived 7.1 (H) <7.0 %      Assessment & Plan:   Problem List Items Addressed This Visit      Cardiovascular and Mediastinum   Hypertension    The current medical regimen is effective;  continue present plan and medications.       Relevant Orders   Bayer DCA Hb A1c Waived   LP+ALT+AST Piccolo, Waived   Basic metabolic panel   Microalbumin, Urine Waived     Endocrine   Diabetes mellitus without complication ([place]) - Primary    Discussed A1c going up Discussed weight stable and need for doing better with lifestyle modification which would help enable weight loss which would be the primary treatment for worsening diabetes. Of course continuing current medications      Relevant Medications   dapagliflozin propanediol (FARXIGA) 10 MG TABS tablet   Other Relevant Orders   Bayer DCA Hb A1c Waived   LP+ALT+AST  Piccolo, Waived   Basic Industrial/product designer, Urine Waived     Other   Hyperlipidemia    The current medical regimen is effective;  continue present plan and medications.       Relevant Orders   Bayer DCA Hb A1c Waived   LP+ALT+AST Piccolo, Waived   Basic metabolic panel   Microalbumin, Urine Waived   BMI 40.0-44.9, adult (HCC)    Discussed wt loss diet exercise       Relevant Medications   dapagliflozin propanediol (FARXIGA) 10 MG TABS tablet    Other Visit Diagnoses    Immunization due       Relevant Orders   Flu Vaccine QUAD 36+ mos PF IM (Fluarix  & Fluzone Quad PF) (Completed)       Follow up plan: Return in about 3 months (around 05/01/2016) for Hemoglobin A1c.

## 2016-01-31 NOTE — Assessment & Plan Note (Addendum)
Discussed A1c going up Discussed weight stable and need for doing better with lifestyle modification which would help enable weight loss which would be the primary treatment for worsening diabetes. Of course continuing current medications

## 2016-01-31 NOTE — Assessment & Plan Note (Signed)
Discussed wt loss diet exercise

## 2016-02-01 LAB — BASIC METABOLIC PANEL
BUN/Creatinine Ratio: 13 (ref 9–20)
BUN: 12 mg/dL (ref 6–24)
CALCIUM: 9.2 mg/dL (ref 8.7–10.2)
CO2: 25 mmol/L (ref 18–29)
Chloride: 97 mmol/L (ref 96–106)
Creatinine, Ser: 0.9 mg/dL (ref 0.76–1.27)
GFR calc Af Amer: 114 mL/min/{1.73_m2} (ref >59)
GFR, EST NON AFRICAN AMERICAN: 99 mL/min/{1.73_m2} (ref >59)
GLUCOSE: 122 mg/dL — AB (ref 65–99)
Potassium: 4.2 mmol/L (ref 3.5–5.2)
SODIUM: 135 mmol/L (ref 134–144)

## 2016-02-03 ENCOUNTER — Encounter: Payer: Self-pay | Admitting: Family Medicine

## 2016-05-01 ENCOUNTER — Encounter: Payer: Self-pay | Admitting: Family Medicine

## 2016-05-01 ENCOUNTER — Ambulatory Visit (INDEPENDENT_AMBULATORY_CARE_PROVIDER_SITE_OTHER): Payer: Managed Care, Other (non HMO) | Admitting: Family Medicine

## 2016-05-01 VITALS — BP 143/87 | HR 103 | Temp 98.8°F | Ht 72.0 in | Wt 314.8 lb

## 2016-05-01 DIAGNOSIS — I10 Essential (primary) hypertension: Secondary | ICD-10-CM

## 2016-05-01 DIAGNOSIS — G4730 Sleep apnea, unspecified: Secondary | ICD-10-CM | POA: Insufficient documentation

## 2016-05-01 DIAGNOSIS — G4733 Obstructive sleep apnea (adult) (pediatric): Secondary | ICD-10-CM | POA: Diagnosis not present

## 2016-05-01 DIAGNOSIS — E7800 Pure hypercholesterolemia, unspecified: Secondary | ICD-10-CM

## 2016-05-01 DIAGNOSIS — E119 Type 2 diabetes mellitus without complications: Secondary | ICD-10-CM

## 2016-05-01 MED ORDER — SITAGLIPTIN PHOSPHATE 100 MG PO TABS: 100.0000 mg | ORAL_TABLET | Freq: Every day | ORAL | 3 refills | Status: DC

## 2016-05-01 NOTE — Assessment & Plan Note (Signed)
Patient also has had sleep study indicating sleep apnea due to a lot of insurance hassles and confusions between in network and out of network has been unable to get his CPAP machine. On further review and evaluation unable to find sleep study. We sent for a copy which is being faxed today. We will then check with insurance to see what we need to do.

## 2016-05-01 NOTE — Assessment & Plan Note (Signed)
The current medical regimen is effective;  continue present plan and medications.  

## 2016-05-01 NOTE — Progress Notes (Signed)
BP (!) 143/87 (BP Location: Left Arm, Patient Position: Sitting, Cuff Size: Large)   Pulse (!) 103   Temp 98.8 F (37.1 C)   Ht 6' (1.829 m)   Wt (!) 314 lb 12.8 oz (142.8 kg)   SpO2 96%   BMI 42.69 kg/m    Subjective:    Patient ID: Cody Burke, male    DOB: [birth year], 50 y.o.   MRN: [id number]  HPI: Cody Burke is a 50 y.o. male  Chief Complaint  Patient presents with  . Diabetes  Pt follow-up diabetes has struggled with lifestyle and weight loss. He is really had a good month with birthdays and holidays with eating too much including some extra cake. Also sometimes struggles with remembering nighttime metformin. Discussed okay to take it whenever he remembers it. Blood pressure no issues taking medications faithfully without problems Cholesterol no issues again taking medicines faithfully without problems. And 4 glucose no low blood sugar spells.  Patient also has had sleep study indicating sleep apnea due to a lot of insurance hassles and confusions between in network and out of network has been unable to get his CPAP machine. On further review and evaluation unable to find sleep study. We sent for a copy which is being faxed today. We will then check with insurance to see what we need to do.  Relevant past medical, surgical, family and social history reviewed and updated as indicated. Interim medical history since our last visit reviewed. Allergies and medications reviewed and updated.  Review of Systems  Constitutional: Negative.   Respiratory: Negative.   Cardiovascular: Negative.     Per HPI unless specifically indicated above     Objective:    BP (!) 143/87 (BP Location: Left Arm, Patient Position: Sitting, Cuff Size: Large)   Pulse (!) 103   Temp 98.8 F (37.1 C)   Ht 6' (1.829 m)   Wt (!) 314 lb 12.8 oz (142.8 kg)   SpO2 96%   BMI 42.69 kg/m   Wt Readings from Last 3 Encounters:  05/01/16 (!) 314 lb 12.8 oz (142.8 kg)  01/31/16 (!) 314 lb (142.4 kg)    10/31/15 (!) 314 lb (142.4 kg)    Physical Exam  Constitutional: He is oriented to person, place, and time. He appears well-developed and well-nourished. No distress.  HENT:  Head: Normocephalic and atraumatic.  Right Ear: Hearing normal.  Left Ear: Hearing normal.  Nose: Nose normal.  Eyes: Conjunctivae and lids are normal. Right eye exhibits no discharge. Left eye exhibits no discharge. No scleral icterus.  Cardiovascular: Normal rate, regular rhythm and normal heart sounds.   Pulmonary/Chest: Effort normal and breath sounds normal. No respiratory distress.  Musculoskeletal: Normal range of motion.  Neurological: He is alert and oriented to person, place, and time.  Skin: Skin is intact. No rash noted.  Psychiatric: He has a normal mood and affect. His speech is normal and behavior is normal. Judgment and thought content normal. Cognition and memory are normal.    Results for orders placed or performed in visit on 01/31/16  Bayer DCA Hb A1c Waived  Result Value Ref Range   Bayer DCA Hb A1c Waived 7.3 (H) <7.0 %  LP+ALT+AST Piccolo, Waived  Result Value Ref Range   ALT (SGPT) Piccolo, Waived 37 10 - 47 U/L   AST (SGOT) Piccolo, Waived 37 11 - 38 U/L   Cholesterol Piccolo, Waived 124 <200 mg/dL   HDL Chol Piccolo, Waived 44 (L) >59 mg/dL  Triglycerides Piccolo,Waived 231 (H) <150 mg/dL   Chol/HDL Ratio Piccolo,Waive 2.8 mg/dL   LDL Chol Calc Piccolo Waived 34 <100 mg/dL   VLDL Chol Calc Piccolo,Waive 46 (H) <30 mg/dL  Basic metabolic panel  Result Value Ref Range   Glucose 122 (H) 65 - 99 mg/dL   BUN 12 6 - 24 mg/dL   Creatinine, Ser 0.90 0.76 - 1.27 mg/dL   GFR calc non Af Amer 99 >59 mL/min/1.73   GFR calc Af Amer 114 >59 mL/min/1.73   BUN/Creatinine Ratio 13 9 - 20   Sodium 135 134 - 144 mmol/L   Potassium 4.2 3.5 - 5.2 mmol/L   Chloride 97 96 - 106 mmol/L   CO2 25 18 - 29 mmol/L   Calcium 9.2 8.7 - 10.2 mg/dL  Microalbumin, Urine Waived  Result Value Ref Range    Microalb, Ur Waived 10 0 - 19 mg/L   Creatinine, Urine Waived 50 10 - 300 mg/dL   Microalb/Creat Ratio <30 <30 mg/g      Assessment & Plan:   Problem List Items Addressed This Visit      Cardiovascular and Mediastinum   Hypertension    The current medical regimen is effective;  continue present plan and medications.         Respiratory   Sleep apnea    Patient also has had sleep study indicating sleep apnea due to a lot of insurance hassles and confusions between in network and out of network has been unable to get his CPAP machine. On further review and evaluation unable to find sleep study. We sent for a copy which is being faxed today. We will then check with insurance to see what we need to do.        Endocrine   Diabetes mellitus without complication ([place]) - Primary    Continue current medications will add Januvia.      Relevant Medications   sitaGLIPtin (JANUVIA) 100 MG tablet   Other Relevant Orders   Bayer DCA Hb A1c Waived     Other   Hyperlipidemia    The current medical regimen is effective;  continue present plan and medications.           Follow up plan: Return in about 3 months (around 07/30/2016) for BMP,  Lipids, ALT, AST, Hemoglobin A1c.

## 2016-05-01 NOTE — Assessment & Plan Note (Signed)
Continue current medications will add Januvia.

## 2016-05-02 LAB — BAYER DCA HB A1C WAIVED: HB A1C (BAYER DCA - WAIVED): 7.9 % — ABNORMAL HIGH (ref <7.0)

## 2016-05-24 ENCOUNTER — Other Ambulatory Visit: Payer: Self-pay | Admitting: Family Medicine

## 2016-05-27 NOTE — Telephone Encounter (Signed)
Last OV: 05/01/16 Next OV: 07/24/16  A1C 7.9% 05/01/16

## 2016-07-24 ENCOUNTER — Ambulatory Visit (INDEPENDENT_AMBULATORY_CARE_PROVIDER_SITE_OTHER): Payer: Managed Care, Other (non HMO) | Admitting: Family Medicine

## 2016-07-24 ENCOUNTER — Encounter: Payer: Self-pay | Admitting: Family Medicine

## 2016-07-24 VITALS — BP 132/78 | HR 89 | Ht 72.0 in | Wt 311.0 lb

## 2016-07-24 DIAGNOSIS — I10 Essential (primary) hypertension: Secondary | ICD-10-CM | POA: Diagnosis not present

## 2016-07-24 DIAGNOSIS — E119 Type 2 diabetes mellitus without complications: Secondary | ICD-10-CM | POA: Diagnosis not present

## 2016-07-24 DIAGNOSIS — E7800 Pure hypercholesterolemia, unspecified: Secondary | ICD-10-CM | POA: Diagnosis not present

## 2016-07-24 MED ORDER — DAPAGLIFLOZIN PROPANEDIOL 10 MG PO TABS: 10.0000 mg | ORAL_TABLET | Freq: Every day | ORAL | 3 refills | Status: DC

## 2016-07-24 MED ORDER — BENAZEPRIL HCL 40 MG PO TABS: 40.0000 mg | ORAL_TABLET | Freq: Every day | ORAL | 4 refills | Status: DC

## 2016-07-24 MED ORDER — SITAGLIPTIN PHOSPHATE 100 MG PO TABS: 100.0000 mg | ORAL_TABLET | Freq: Every day | ORAL | 3 refills | Status: DC

## 2016-07-24 MED ORDER — METFORMIN HCL 500 MG PO TABS: 1000.0000 mg | ORAL_TABLET | Freq: Two times a day (BID) | ORAL | 4 refills | Status: DC

## 2016-07-24 MED ORDER — ATORVASTATIN CALCIUM 20 MG PO TABS: 20.0000 mg | ORAL_TABLET | Freq: Every day | ORAL | 4 refills | Status: DC

## 2016-07-24 NOTE — Progress Notes (Signed)
BP 132/78 (BP Location: Left Arm)   Pulse 89   Ht 6' (1.829 m)   Wt (!) 311 lb (141.1 kg)   SpO2 97%   BMI 42.18 kg/m    Subjective:    Patient ID: Cody Burke, male    DOB: 04/06/1966, 51 y.o.   MRN: [id number]  HPI: Cody Burke is a 51 y.o. male  Chief Complaint  Patient presents with  . Follow-up  . Diabetes   Patient all in all doing well had an episode of stomach bug but got through that and is unable to lose a couple of pounds. No low blood sugar spells all in all doing well. Diabetes medicines doing well. Same with Benzapril and atorvastatin for cholesterol and blood pressure. Relevant past medical, surgical, family and social history reviewed and updated as indicated. Interim medical history since our last visit reviewed. Allergies and medications reviewed and updated.  Review of Systems  Constitutional: Negative.   Respiratory: Negative.   Cardiovascular: Negative.     Per HPI unless specifically indicated above     Objective:    BP 132/78 (BP Location: Left Arm)   Pulse 89   Ht 6' (1.829 m)   Wt (!) 311 lb (141.1 kg)   SpO2 97%   BMI 42.18 kg/m   Wt Readings from Last 3 Encounters:  07/24/16 (!) 311 lb (141.1 kg)  05/01/16 (!) 314 lb 12.8 oz (142.8 kg)  01/31/16 (!) 314 lb (142.4 kg)    Physical Exam  Constitutional: He is oriented to person, place, and time. He appears well-developed and well-nourished.  HENT:  Head: Normocephalic and atraumatic.  Eyes: Conjunctivae and EOM are normal.  Neck: Normal range of motion.  Cardiovascular: Normal rate, regular rhythm and normal heart sounds.   Pulmonary/Chest: Effort normal and breath sounds normal.  Musculoskeletal: Normal range of motion.  Neurological: He is alert and oriented to person, place, and time.  Skin: No erythema.  Psychiatric: He has a normal mood and affect. His behavior is normal. Judgment and thought content normal.    Results for orders placed or performed in visit on 05/01/16    Bayer DCA Hb A1c Waived  Result Value Ref Range   Bayer DCA Hb A1c Waived 7.9 (H) <7.0 %      Assessment & Plan:   Problem List Items Addressed This Visit      Cardiovascular and Mediastinum   Hypertension - Primary    The current medical regimen is effective;  continue present plan and medications.       Relevant Medications   benazepril (LOTENSIN) 40 MG tablet   atorvastatin (LIPITOR) 20 MG tablet   Other Relevant Orders   Basic metabolic panel   Bayer DCA Hb A1c Waived   LP+ALT+AST Piccolo, Waived     Endocrine   Diabetes mellitus without complication ([place])    The current medical regimen is effective;  continue present plan and medications.       Relevant Medications   metFORMIN (GLUCOPHAGE) 500 MG tablet   benazepril (LOTENSIN) 40 MG tablet   atorvastatin (LIPITOR) 20 MG tablet   sitaGLIPtin (JANUVIA) 100 MG tablet   dapagliflozin propanediol (FARXIGA) 10 MG TABS tablet   Other Relevant Orders   Basic metabolic panel   Bayer DCA Hb A1c Waived   LP+ALT+AST Piccolo, Waived     Other   Hyperlipidemia    The current medical regimen is effective;  continue present plan and medications.  Relevant Medications   benazepril (LOTENSIN) 40 MG tablet   atorvastatin (LIPITOR) 20 MG tablet   Other Relevant Orders   Basic metabolic panel   Bayer DCA Hb A1c Waived   LP+ALT+AST Piccolo, Waived       Follow up plan: Return in about 3 months (around 10/24/2016) for Physical Exam, Hemoglobin A1c.

## 2016-07-24 NOTE — Assessment & Plan Note (Signed)
The current medical regimen is effective;  continue present plan and medications.  

## 2016-07-25 ENCOUNTER — Encounter: Payer: Self-pay | Admitting: Family Medicine

## 2016-07-25 LAB — BASIC METABOLIC PANEL
BUN/Creatinine Ratio: 14 (ref 9–20)
BUN: 13 mg/dL (ref 6–24)
CHLORIDE: 99 mmol/L (ref 96–106)
CO2: 21 mmol/L (ref 18–29)
Calcium: 8.7 mg/dL (ref 8.7–10.2)
Creatinine, Ser: 0.93 mg/dL (ref 0.76–1.27)
GFR, EST AFRICAN AMERICAN: 109 mL/min/{1.73_m2} (ref >59)
GFR, EST NON AFRICAN AMERICAN: 94 mL/min/{1.73_m2} (ref >59)
Glucose: 160 mg/dL — ABNORMAL HIGH (ref 65–99)
POTASSIUM: 4.1 mmol/L (ref 3.5–5.2)
SODIUM: 136 mmol/L (ref 134–144)

## 2016-07-25 LAB — LP+ALT+AST PICCOLO, WAIVED
ALT (SGPT) Piccolo, Waived: 46 U/L (ref 10–47)
AST (SGOT) Piccolo, Waived: 34 U/L (ref 11–38)
CHOL/HDL RATIO PICCOLO,WAIVE: 3.1 mg/dL
Cholesterol Piccolo, Waived: 115 mg/dL (ref <200)
HDL Chol Piccolo, Waived: 37 mg/dL — ABNORMAL LOW (ref >59)
LDL CHOL CALC PICCOLO WAIVED: 32 mg/dL (ref <100)
TRIGLYCERIDES PICCOLO,WAIVED: 229 mg/dL — AB (ref <150)
VLDL CHOL CALC PICCOLO,WAIVE: 46 mg/dL — AB (ref <30)

## 2016-07-25 LAB — BAYER DCA HB A1C WAIVED: HB A1C (BAYER DCA - WAIVED): 7.5 % — ABNORMAL HIGH (ref <7.0)

## 2016-11-06 ENCOUNTER — Encounter: Payer: Managed Care, Other (non HMO) | Admitting: Family Medicine

## 2016-11-24 ENCOUNTER — Encounter: Payer: Self-pay | Admitting: Family Medicine

## 2016-11-24 ENCOUNTER — Ambulatory Visit (INDEPENDENT_AMBULATORY_CARE_PROVIDER_SITE_OTHER): Payer: Managed Care, Other (non HMO) | Admitting: Family Medicine

## 2016-11-24 VITALS — BP 138/84 | HR 80 | Ht 73.72 in | Wt 306.0 lb

## 2016-11-24 DIAGNOSIS — I10 Essential (primary) hypertension: Secondary | ICD-10-CM | POA: Diagnosis not present

## 2016-11-24 DIAGNOSIS — E119 Type 2 diabetes mellitus without complications: Secondary | ICD-10-CM | POA: Diagnosis not present

## 2016-11-24 DIAGNOSIS — G4733 Obstructive sleep apnea (adult) (pediatric): Secondary | ICD-10-CM

## 2016-11-24 DIAGNOSIS — E7800 Pure hypercholesterolemia, unspecified: Secondary | ICD-10-CM

## 2016-11-24 DIAGNOSIS — Z125 Encounter for screening for malignant neoplasm of prostate: Secondary | ICD-10-CM

## 2016-11-24 DIAGNOSIS — Z1329 Encounter for screening for other suspected endocrine disorder: Secondary | ICD-10-CM | POA: Diagnosis not present

## 2016-11-24 DIAGNOSIS — Z0000 Encounter for general adult medical examination without abnormal findings: Secondary | ICD-10-CM | POA: Diagnosis not present

## 2016-11-24 LAB — URINALYSIS, ROUTINE W REFLEX MICROSCOPIC
Bilirubin, UA: NEGATIVE
KETONES UA: NEGATIVE
LEUKOCYTES UA: NEGATIVE
Nitrite, UA: NEGATIVE
Protein, UA: NEGATIVE
SPEC GRAV UA: 1.02 (ref 1.005–1.030)
Urobilinogen, Ur: 0.2 mg/dL (ref 0.2–1.0)
pH, UA: 5 (ref 5.0–7.5)

## 2016-11-24 LAB — MICROSCOPIC EXAMINATION
BACTERIA UA: NONE SEEN
RBC MICROSCOPIC, UA: NONE SEEN /HPF (ref 0–?)

## 2016-11-24 LAB — BAYER DCA HB A1C WAIVED: HB A1C (BAYER DCA - WAIVED): 6.5 % (ref <7.0)

## 2016-11-24 MED ORDER — BENAZEPRIL HCL 40 MG PO TABS: 40.0000 mg | ORAL_TABLET | Freq: Every day | ORAL | 4 refills | Status: DC

## 2016-11-24 MED ORDER — METFORMIN HCL 500 MG PO TABS: 1000.0000 mg | ORAL_TABLET | Freq: Two times a day (BID) | ORAL | 4 refills | Status: DC

## 2016-11-24 MED ORDER — DAPAGLIFLOZIN PROPANEDIOL 10 MG PO TABS: 10.0000 mg | ORAL_TABLET | Freq: Every day | ORAL | 3 refills | Status: DC

## 2016-11-24 MED ORDER — SITAGLIPTIN PHOSPHATE 100 MG PO TABS: 100.0000 mg | ORAL_TABLET | Freq: Every day | ORAL | 4 refills | Status: DC

## 2016-11-24 MED ORDER — ATORVASTATIN CALCIUM 20 MG PO TABS: 20.0000 mg | ORAL_TABLET | Freq: Every day | ORAL | 4 refills | Status: DC

## 2016-11-24 NOTE — Assessment & Plan Note (Signed)
The current medical regimen is effective;  continue present plan and medications.  

## 2016-11-24 NOTE — Progress Notes (Signed)
BP 138/84 (BP Location: Left Arm)   Pulse 80   Ht 6' 1.72" (1.872 m)   Wt (!) 306 lb (138.8 kg)   SpO2 98%   BMI 39.59 kg/m    Subjective:    Patient ID: Cody Burke, male    DOB: [birth year], 51 y.o.   MRN: [id number]  HPI: Cody Burke is a 51 y.o. male  Chief Complaint  Patient presents with  . Annual Exam   Patient follow-up doing well with cholesterol blood pressure diabetes no issues with medications no low blood sugar spells has even lost a little bit of weight this year so far. Still hasn't been able to work through [facility] issues for getting his CPAP and the second CPAP study. Patient will try again checking with his insurance on when to get his follow-up sleep study and evaluation for CPAP machine.  Relevant past medical, surgical, family and social history reviewed and updated as indicated. Interim medical history since our last visit reviewed. Allergies and medications reviewed and updated.  Review of Systems  Constitutional: Negative.   HENT: Negative.   Eyes: Negative.   Respiratory: Negative.   Cardiovascular: Negative.   Gastrointestinal: Negative.   Endocrine: Negative.   Genitourinary: Negative.   Musculoskeletal: Negative.        Patient was some left shoulder discomfort some limitation range of motion and some generalized weakness. Does not wake at night but will take an occasional aspirin.  Skin: Negative.   Allergic/Immunologic: Negative.   Neurological: Negative.   Hematological: Negative.   Psychiatric/Behavioral: Negative.     Per HPI unless specifically indicated above     Objective:    BP 138/84 (BP Location: Left Arm)   Pulse 80   Ht 6' 1.72" (1.872 m)   Wt (!) 306 lb (138.8 kg)   SpO2 98%   BMI 39.59 kg/m   Wt Readings from Last 3 Encounters:  11/24/16 (!) 306 lb (138.8 kg)  07/24/16 (!) 311 lb (141.1 kg)  05/01/16 (!) 314 lb 12.8 oz (142.8 kg)    Physical Exam  Constitutional: He is oriented to person, place, and  time. He appears well-developed and well-nourished.  HENT:  Head: Normocephalic and atraumatic.  Right Ear: External ear normal.  Left Ear: External ear normal.  Eyes: Conjunctivae and EOM are normal. Pupils are equal, round, and reactive to light.  Neck: Normal range of motion. Neck supple.  Cardiovascular: Normal rate, regular rhythm, normal heart sounds and intact distal pulses.   Pulmonary/Chest: Effort normal and breath sounds normal.  Abdominal: Soft. Bowel sounds are normal. There is no splenomegaly or hepatomegaly.  Genitourinary: Rectum normal, prostate normal and penis normal.  Musculoskeletal: Normal range of motion.  Neurological: He is alert and oriented to person, place, and time. He has normal reflexes.  Skin: No rash noted. No erythema.  Psychiatric: He has a normal mood and affect. His behavior is normal. Judgment and thought content normal.    Results for orders placed or performed in visit on 46/56/81  Basic metabolic panel  Result Value Ref Range   Glucose 160 (H) 65 - 99 mg/dL   BUN 13 6 - 24 mg/dL   Creatinine, Ser 0.93 0.76 - 1.27 mg/dL   GFR calc non Af Amer 94 >59 mL/min/1.73   GFR calc Af Amer 109 >59 mL/min/1.73   BUN/Creatinine Ratio 14 9 - 20   Sodium 136 134 - 144 mmol/L   Potassium 4.1 3.5 - 5.2 mmol/L   Chloride  99 96 - 106 mmol/L   CO2 21 18 - 29 mmol/L   Calcium 8.7 8.7 - 10.2 mg/dL  Bayer DCA Hb A1c Waived  Result Value Ref Range   Bayer DCA Hb A1c Waived 7.5 (H) <7.0 %  LP+ALT+AST Piccolo, Waived  Result Value Ref Range   ALT (SGPT) Piccolo, Waived 46 10 - 47 U/L   AST (SGOT) Piccolo, Waived 34 11 - 38 U/L   Cholesterol Piccolo, Waived 115 <200 mg/dL   HDL Chol Piccolo, Waived 37 (L) >59 mg/dL   Triglycerides Piccolo,Waived 229 (H) <150 mg/dL   Chol/HDL Ratio Piccolo,Waive 3.1 mg/dL   LDL Chol Calc Piccolo Waived 32 <100 mg/dL   VLDL Chol Calc Piccolo,Waive 46 (H) <30 mg/dL      Assessment & Plan:   Problem List Items Addressed This  Visit      Cardiovascular and Mediastinum   Hypertension    The current medical regimen is effective;  continue present plan and medications.       Relevant Medications   atorvastatin (LIPITOR) 20 MG tablet   benazepril (LOTENSIN) 40 MG tablet   Other Relevant Orders   CBC with Differential/Platelet   Comprehensive metabolic panel   Urinalysis, Routine w reflex microscopic   Bayer DCA Hb A1c Waived     Respiratory   Sleep apnea    Discussed still hasn't had follow-up CPAP study gave patient print out of this current study and will get back with [employer] on finding out proper place for getting his follow-up CPAP titration trial. Encouraged patient to call at least twice a week until resolution.        Endocrine   Diabetes mellitus without complication ([facility]CC)   Relevant Medications   atorvastatin (LIPITOR) 20 MG tablet   benazepril (LOTENSIN) 40 MG tablet   dapagliflozin propanediol (FARXIGA) 10 MG TABS tablet   metFORMIN (GLUCOPHAGE) 500 MG tablet   sitaGLIPtin (JANUVIA) 100 MG tablet   Other Relevant Orders   CBC with Differential/Platelet   Comprehensive metabolic panel   Urinalysis, Routine w reflex microscopic   Bayer DCA Hb A1c Waived     Other   Hyperlipidemia    The current medical regimen is effective;  continue present plan and medications.       Relevant Medications   atorvastatin (LIPITOR) 20 MG tablet   benazepril (LOTENSIN) 40 MG tablet   Other Relevant Orders   CBC with Differential/Platelet   Comprehensive metabolic panel   Lipid panel   Bayer DCA Hb A1c Waived    Other Visit Diagnoses    Annual physical exam    -  Primary   Prostate cancer screening       Relevant Orders   PSA   Thyroid disorder screen       Relevant Orders   TSH       Follow up plan: Return in about 3 months (around 02/24/2017) for Hemoglobin A1c.

## 2016-11-24 NOTE — Assessment & Plan Note (Signed)
Discussed still hasn't had follow-up CPAP study gave patient print out of this current study and will get back with [employer] on finding out proper place for getting his follow-up CPAP titration trial. Encouraged patient to call at least twice a week until resolution.

## 2016-11-25 ENCOUNTER — Encounter: Payer: Self-pay | Admitting: Family Medicine

## 2016-11-25 LAB — LIPID PANEL
CHOLESTEROL TOTAL: 133 mg/dL (ref 100–199)
Chol/HDL Ratio: 2.7 ratio (ref 0.0–5.0)
HDL: 49 mg/dL (ref >39)
LDL Calculated: 51 mg/dL (ref 0–99)
Triglycerides: 167 mg/dL — ABNORMAL HIGH (ref 0–149)
VLDL CHOLESTEROL CAL: 33 mg/dL (ref 5–40)

## 2016-11-25 LAB — CBC WITH DIFFERENTIAL/PLATELET
BASOS ABS: 0.1 10*3/uL (ref 0.0–0.2)
BASOS: 1 %
EOS (ABSOLUTE): 0.4 10*3/uL (ref 0.0–0.4)
Eos: 4 %
HEMATOCRIT: 52.5 % — AB (ref 37.5–51.0)
HEMOGLOBIN: 17.6 g/dL (ref 13.0–17.7)
IMMATURE GRANS (ABS): 0.1 10*3/uL (ref 0.0–0.1)
Immature Granulocytes: 1 %
LYMPHS ABS: 4.5 10*3/uL — AB (ref 0.7–3.1)
Lymphs: 44 %
MCH: 29.9 pg (ref 26.6–33.0)
MCHC: 33.5 g/dL (ref 31.5–35.7)
MCV: 89 fL (ref 79–97)
MONOCYTES: 8 %
Monocytes Absolute: 0.8 10*3/uL (ref 0.1–0.9)
Neutrophils Absolute: 4.2 10*3/uL (ref 1.4–7.0)
Neutrophils: 42 %
Platelets: 214 10*3/uL (ref 150–379)
RBC: 5.89 x10E6/uL — ABNORMAL HIGH (ref 4.14–5.80)
RDW: 13.6 % (ref 12.3–15.4)
WBC: 10 10*3/uL (ref 3.4–10.8)

## 2016-11-25 LAB — COMPREHENSIVE METABOLIC PANEL
A/G RATIO: 1.5 (ref 1.2–2.2)
ALBUMIN: 4.3 g/dL (ref 3.5–5.5)
ALK PHOS: 97 IU/L (ref 39–117)
ALT: 38 IU/L (ref 0–44)
AST: 24 IU/L (ref 0–40)
BILIRUBIN TOTAL: 0.3 mg/dL (ref 0.0–1.2)
BUN / CREAT RATIO: 15 (ref 9–20)
BUN: 13 mg/dL (ref 6–24)
CHLORIDE: 99 mmol/L (ref 96–106)
CO2: 23 mmol/L (ref 20–29)
Calcium: 9.4 mg/dL (ref 8.7–10.2)
Creatinine, Ser: 0.86 mg/dL (ref 0.76–1.27)
GFR calc non Af Amer: 100 mL/min/{1.73_m2} (ref >59)
GFR, EST AFRICAN AMERICAN: 115 mL/min/{1.73_m2} (ref >59)
GLOBULIN, TOTAL: 2.8 g/dL (ref 1.5–4.5)
GLUCOSE: 146 mg/dL — AB (ref 65–99)
POTASSIUM: 4.8 mmol/L (ref 3.5–5.2)
SODIUM: 139 mmol/L (ref 134–144)
TOTAL PROTEIN: 7.1 g/dL (ref 6.0–8.5)

## 2016-11-25 LAB — TSH: TSH: 2.11 u[IU]/mL (ref 0.450–4.500)

## 2016-11-25 LAB — PSA

## 2017-02-25 IMAGING — CR DG CHEST 2V
1 series · 3 of 3 positions shown · non-contrast
Comparison: None

CLINICAL DATA: Elevated hemoglobin and hematocrit, dry cough for 1
week, former smoker, hypertension

EXAM:
CHEST  2 VIEW

[Series 1: pa · 0.17mm/px · 3 of 3 slices shown]
[im 1/3]
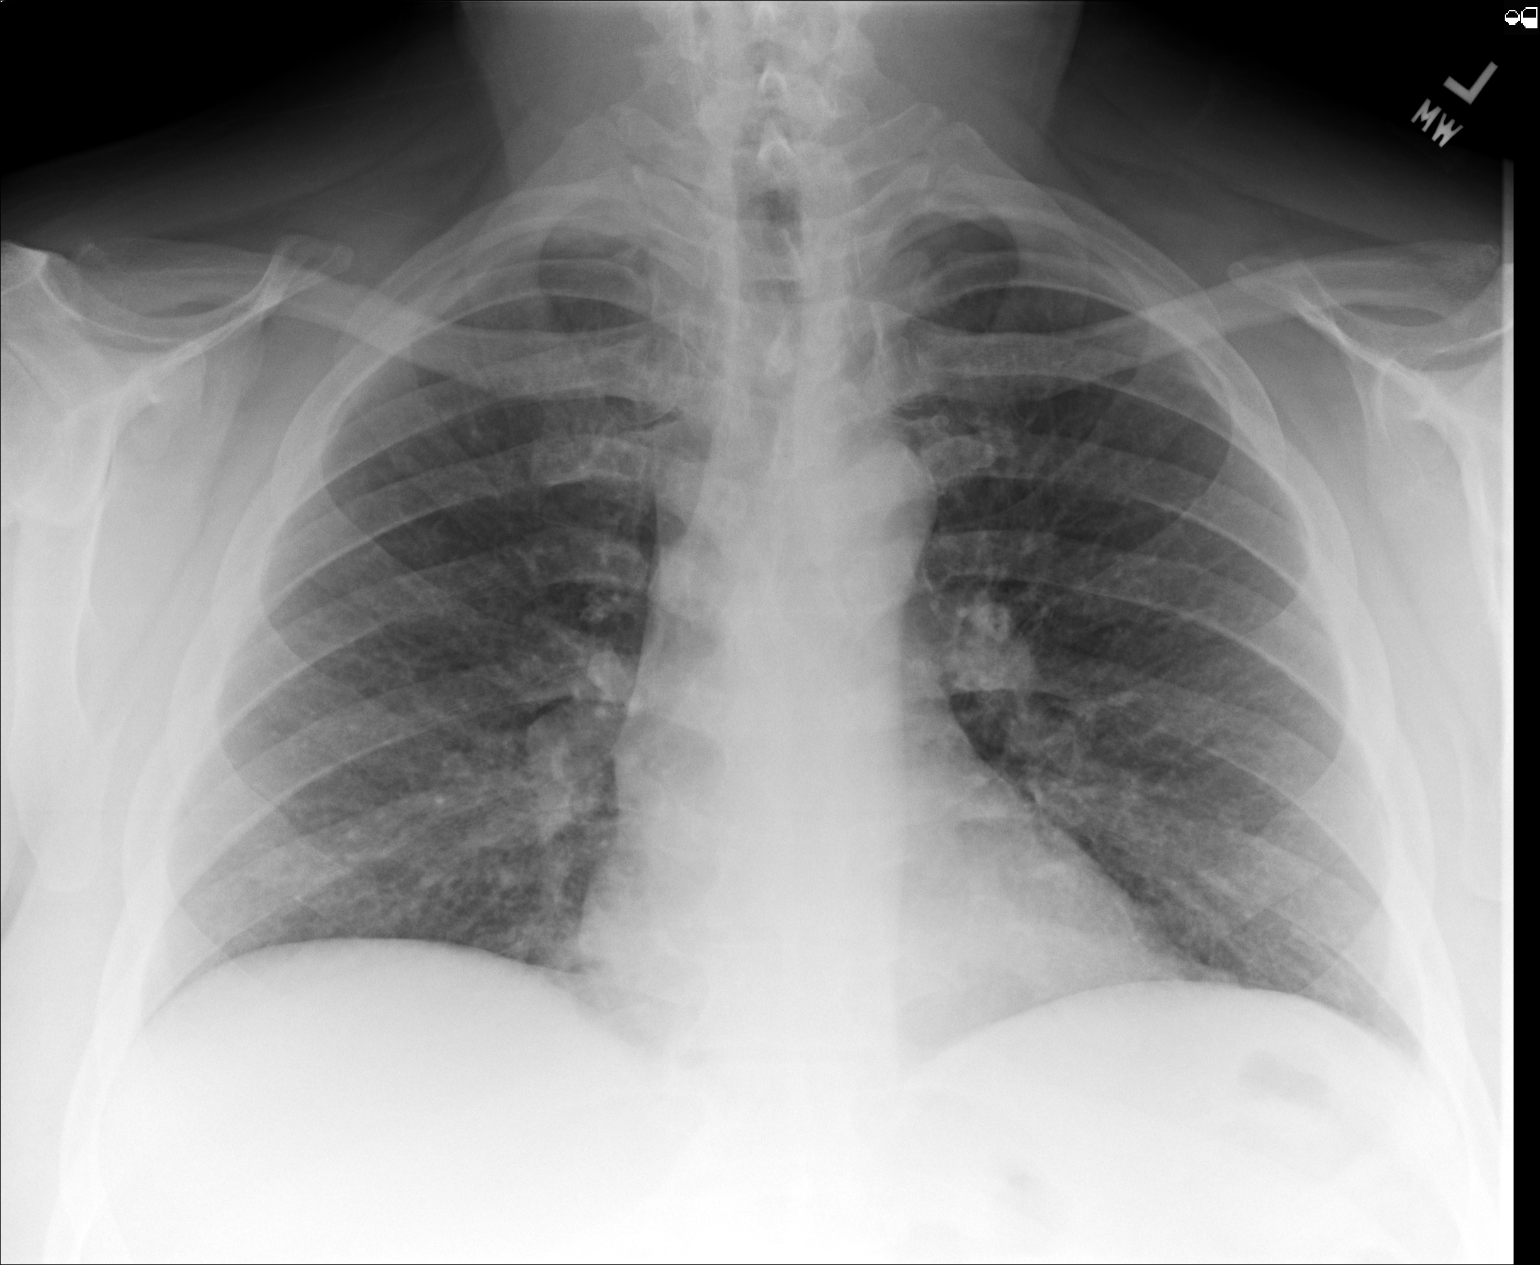
[im 2/3]
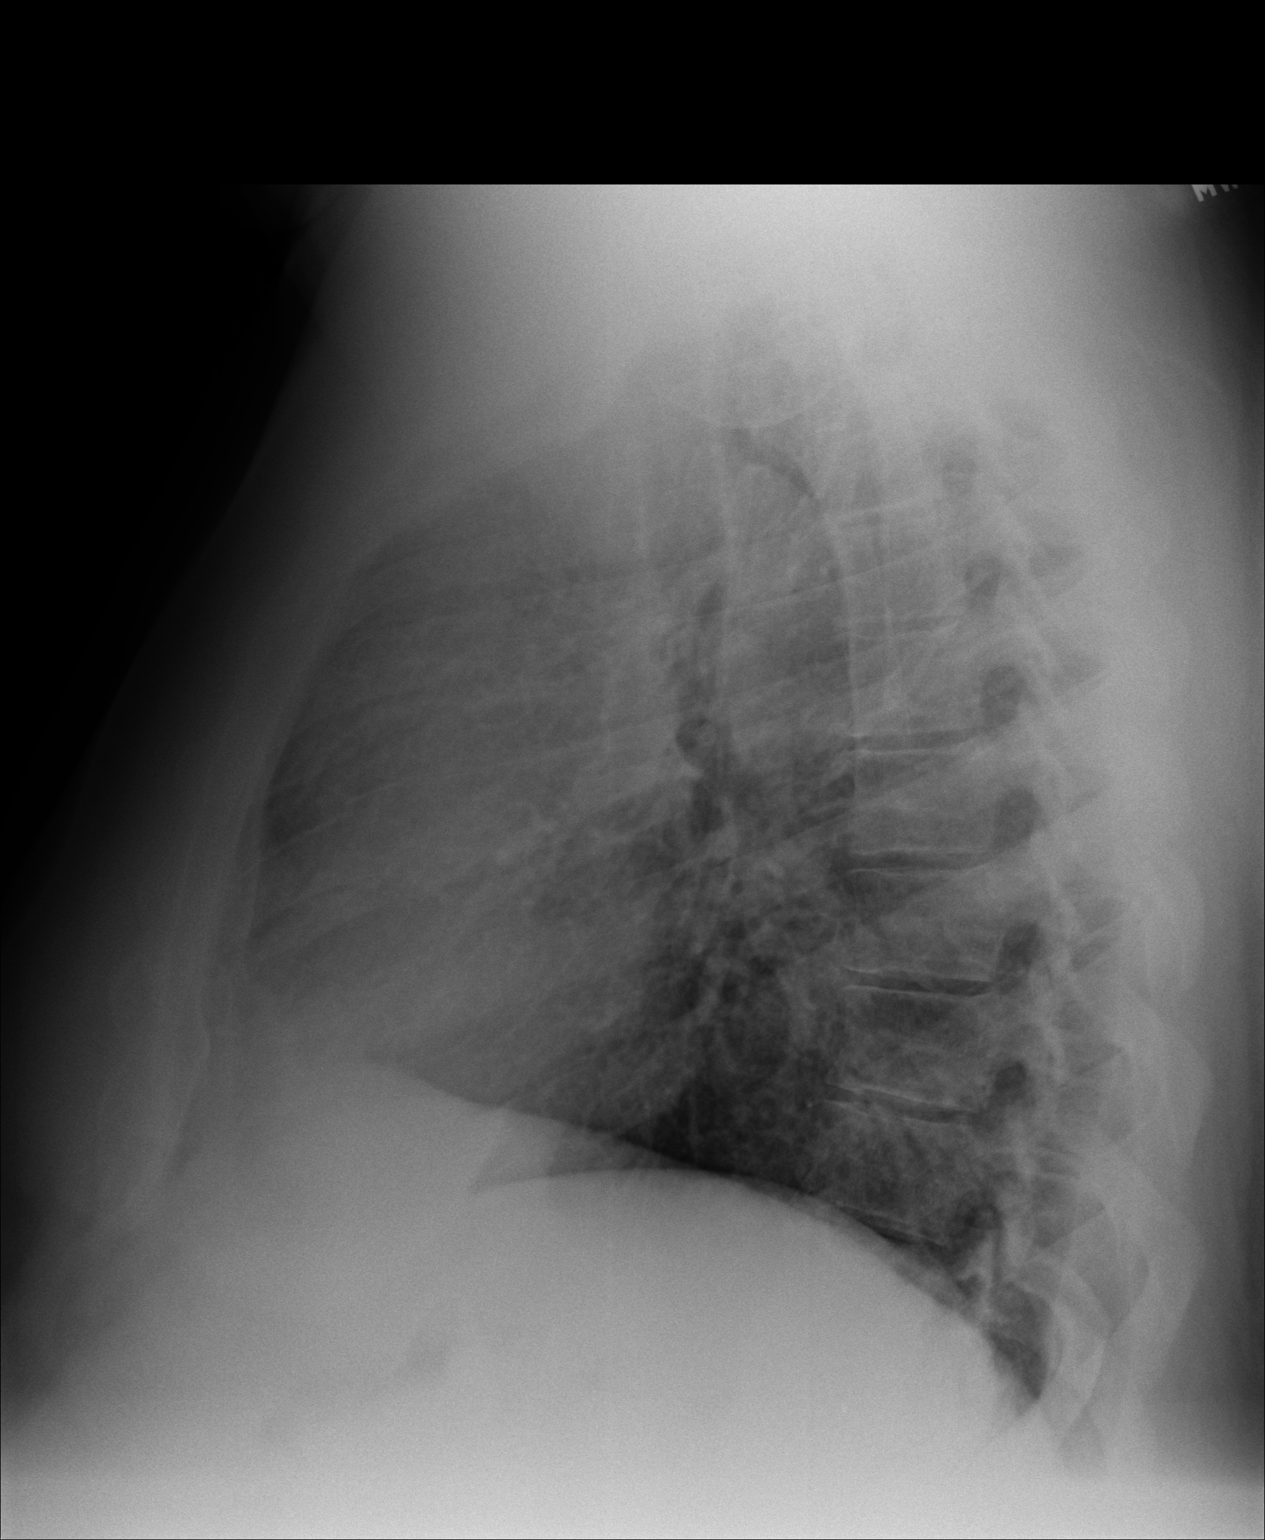
[im 3/3]
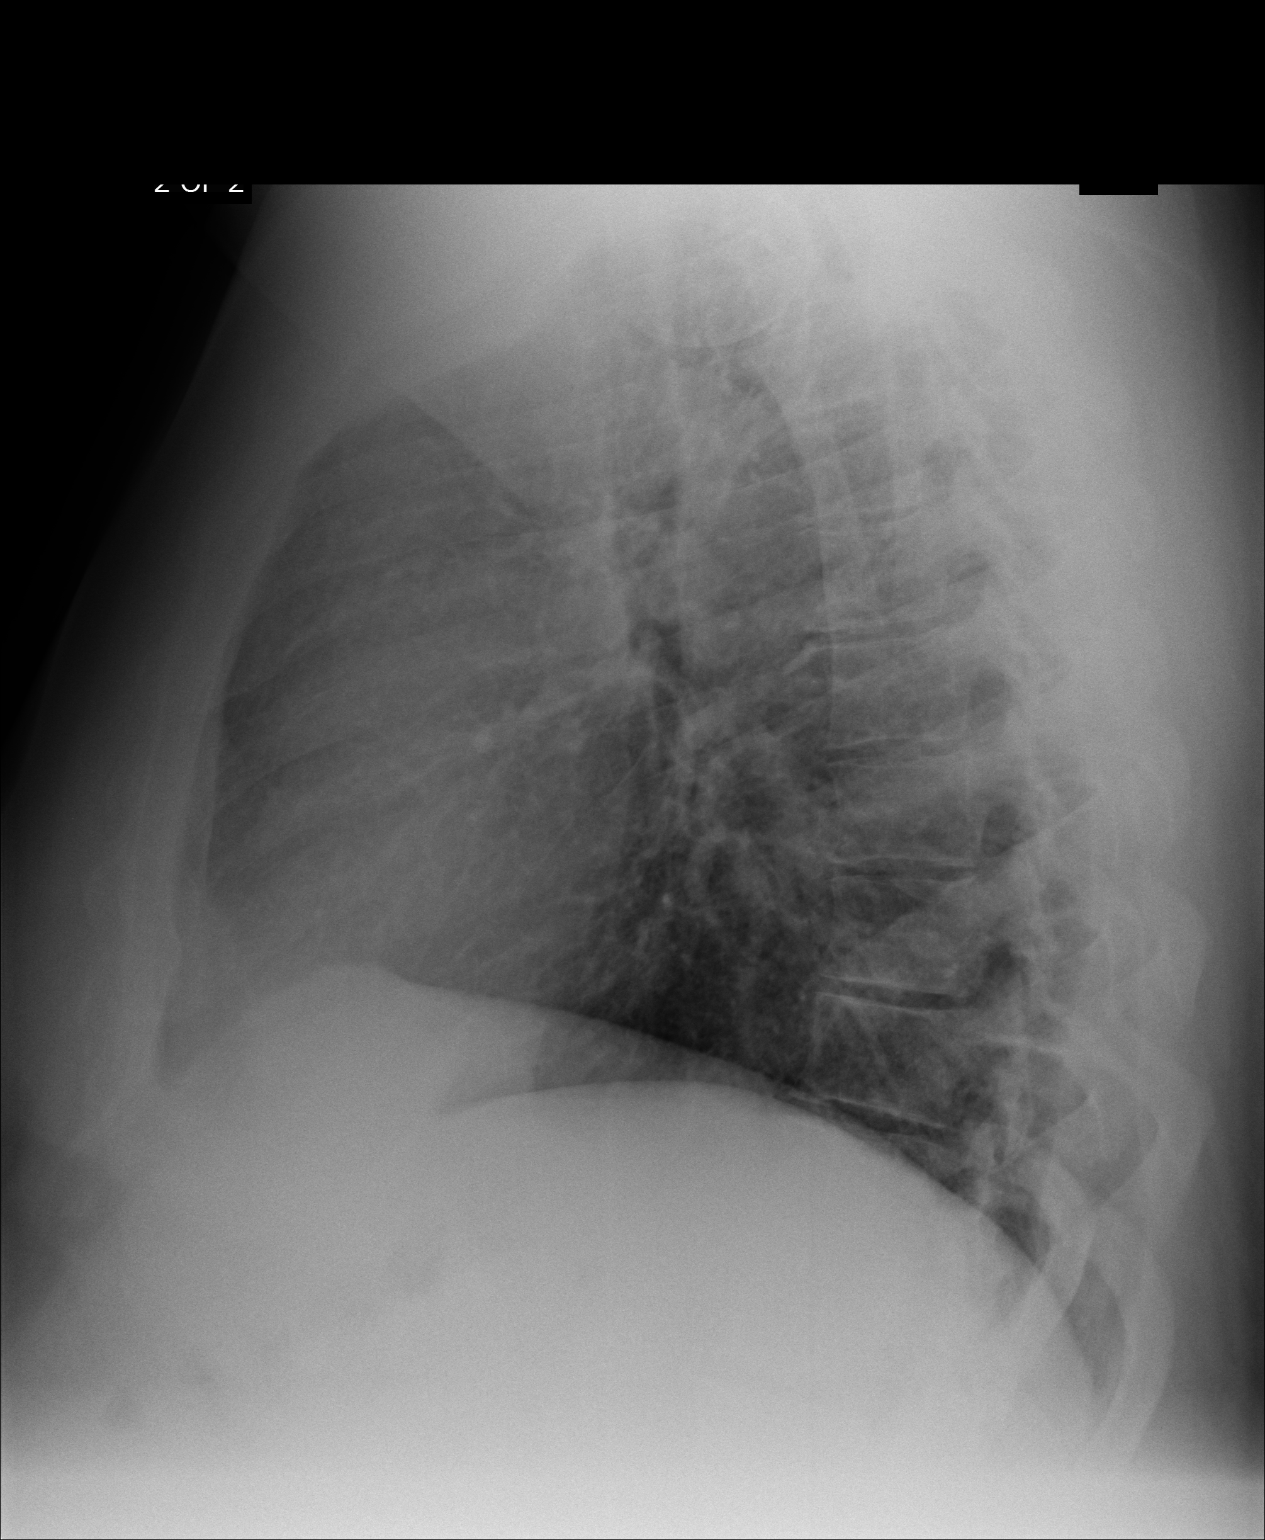

[3 of 3 positions shown; findings below may reference images not displayed]

FINDINGS: Normal heart size, mediastinal contours, and pulmonary vascularity.

Lungs clear.

No pleural effusion or pneumothorax.

Bones unremarkable.
IMPRESSION: No acute abnormalities.

## 2017-03-03 ENCOUNTER — Ambulatory Visit (INDEPENDENT_AMBULATORY_CARE_PROVIDER_SITE_OTHER): Payer: Managed Care, Other (non HMO) | Admitting: Family Medicine

## 2017-03-03 ENCOUNTER — Encounter: Payer: Self-pay | Admitting: Family Medicine

## 2017-03-03 VITALS — BP 129/83 | HR 79 | Wt 290.0 lb

## 2017-03-03 DIAGNOSIS — E119 Type 2 diabetes mellitus without complications: Secondary | ICD-10-CM | POA: Diagnosis not present

## 2017-03-03 DIAGNOSIS — E7800 Pure hypercholesterolemia, unspecified: Secondary | ICD-10-CM | POA: Diagnosis not present

## 2017-03-03 DIAGNOSIS — I10 Essential (primary) hypertension: Secondary | ICD-10-CM | POA: Diagnosis not present

## 2017-03-03 DIAGNOSIS — Z23 Encounter for immunization: Secondary | ICD-10-CM

## 2017-03-03 NOTE — Assessment & Plan Note (Signed)
The current medical regimen is effective;  continue present plan and medications.  

## 2017-03-03 NOTE — Progress Notes (Addendum)
BP 129/83   Pulse 79   Wt 290 lb (131.5 kg)   SpO2 99%   BMI 37.52 kg/m    Subjective:    Patient ID: Cody Burke, male    DOB: Oct 07, 1965, 51 y.o.   MRN: [id number]  HPI: Derrik Mceachern is a 51 y.o. male  Chief Complaint  Patient presents with  . Follow-up  . Diabetes    Relevant past medical, surgical, family and social history reviewed and updated as indicated. Interim medical history since our last visit reviewed. Allergies and medications reviewed and updated.  Review of Systems  Constitutional: Negative.   Respiratory: Negative.   Cardiovascular: Negative.     Per HPI unless specifically indicated above     Objective:    BP 129/83   Pulse 79   Wt 290 lb (131.5 kg)   SpO2 99%   BMI 37.52 kg/m   Wt Readings from Last 3 Encounters:  03/03/17 290 lb (131.5 kg)  11/24/16 (!) 306 lb (138.8 kg)  07/24/16 (!) 311 lb (141.1 kg)    Physical Exam  Constitutional: He is oriented to person, place, and time. He appears well-developed and well-nourished.  HENT:  Head: Normocephalic and atraumatic.  Eyes: Conjunctivae and EOM are normal.  Neck: Normal range of motion.  Cardiovascular: Normal rate, regular rhythm and normal heart sounds.   Pulmonary/Chest: Effort normal and breath sounds normal.  Musculoskeletal: Normal range of motion.  Neurological: He is alert and oriented to person, place, and time.  Skin: No erythema.  Psychiatric: He has a normal mood and affect. His behavior is normal. Judgment and thought content normal.    Results for orders placed or performed in visit on 11/24/16  Microscopic Examination  Result Value Ref Range   WBC, UA 0-5 0 - 5 /hpf   RBC, UA None seen 0 - 2 /hpf   Epithelial Cells (non renal) 0-10 0 - 10 /hpf   Bacteria, UA None seen None seen/Few  CBC with Differential/Platelet  Result Value Ref Range   WBC 10.0 3.4 - 10.8 x10E3/uL   RBC 5.89 (H) 4.14 - 5.80 x10E6/uL   Hemoglobin 17.6 13.0 - 17.7 g/dL   Hematocrit 52.5 (H)  37.5 - 51.0 %   MCV 89 79 - 97 fL   MCH 29.9 26.6 - 33.0 pg   MCHC 33.5 31.5 - 35.7 g/dL   RDW 13.6 12.3 - 15.4 %   Platelets 214 150 - 379 x10E3/uL   Neutrophils 42 Not Estab. %   Lymphs 44 Not Estab. %   Monocytes 8 Not Estab. %   Eos 4 Not Estab. %   Basos 1 Not Estab. %   Neutrophils Absolute 4.2 1.4 - 7.0 x10E3/uL   Lymphocytes Absolute 4.5 (H) 0.7 - 3.1 x10E3/uL   Monocytes Absolute 0.8 0.1 - 0.9 x10E3/uL   EOS (ABSOLUTE) 0.4 0.0 - 0.4 x10E3/uL   Basophils Absolute 0.1 0.0 - 0.2 x10E3/uL   Immature Granulocytes 1 Not Estab. %   Immature Grans (Abs) 0.1 0.0 - 0.1 x10E3/uL  Comprehensive metabolic panel  Result Value Ref Range   Glucose 146 (H) 65 - 99 mg/dL   BUN 13 6 - 24 mg/dL   Creatinine, Ser 0.86 0.76 - 1.27 mg/dL   GFR calc non Af Amer 100 >59 mL/min/1.73   GFR calc Af Amer 115 >59 mL/min/1.73   BUN/Creatinine Ratio 15 9 - 20   Sodium 139 134 - 144 mmol/L   Potassium 4.8 3.5 - 5.2 mmol/L  Chloride 99 96 - 106 mmol/L   CO2 23 20 - 29 mmol/L   Calcium 9.4 8.7 - 10.2 mg/dL   Total Protein 7.1 6.0 - 8.5 g/dL   Albumin 4.3 3.5 - 5.5 g/dL   Globulin, Total 2.8 1.5 - 4.5 g/dL   Albumin/Globulin Ratio 1.5 1.2 - 2.2   Bilirubin Total 0.3 0.0 - 1.2 mg/dL   Alkaline Phosphatase 97 39 - 117 IU/L   AST 24 0 - 40 IU/L   ALT 38 0 - 44 IU/L  Lipid panel  Result Value Ref Range   Cholesterol, Total 133 100 - 199 mg/dL   Triglycerides 167 (H) 0 - 149 mg/dL   HDL 49 >39 mg/dL   VLDL Cholesterol Cal 33 5 - 40 mg/dL   LDL Calculated 51 0 - 99 mg/dL   Chol/HDL Ratio 2.7 0.0 - 5.0 ratio  PSA  Result Value Ref Range   Prostate Specific Ag, Serum <0.1 0.0 - 4.0 ng/mL  TSH  Result Value Ref Range   TSH 2.110 0.450 - 4.500 uIU/mL  Urinalysis, Routine w reflex microscopic  Result Value Ref Range   Specific Gravity, UA 1.020 1.005 - 1.030   pH, UA 5.0 5.0 - 7.5   Color, UA Yellow Yellow   Appearance Ur Clear Clear   Leukocytes, UA Negative Negative   Protein, UA Negative  Negative/Trace   Glucose, UA 3+ (A) Negative   Ketones, UA Negative Negative   RBC, UA Trace (A) Negative   Bilirubin, UA Negative Negative   Urobilinogen, Ur 0.2 0.2 - 1.0 mg/dL   Nitrite, UA Negative Negative   Microscopic Examination See below:   Bayer DCA Hb A1c Waived  Result Value Ref Range   Bayer DCA Hb A1c Waived 6.5 <7.0 %      Assessment & Plan:   Problem List Items Addressed This Visit      Cardiovascular and Mediastinum   Hypertension - Primary    The current medical regimen is effective;  continue present plan and medications.       Relevant Orders   Bayer DCA Hb A1c Waived     Endocrine   Diabetes mellitus without complication ([place])    With weight-loss good glucose control unable to do A1c here will send out get that report back later this week. With low blood sugar spells will reduce Januvia from 100 mg to 50 mg by taking half a tablet a day. May need to further decrease pending patient's response. Patient will continue diet exercise nutrition plan      Relevant Orders   Bayer DCA Hb A1c Waived   Hgb A1c w/o eAG     Other   Hyperlipidemia   Relevant Orders   Bayer DCA Hb A1c Waived    Other Visit Diagnoses    Needs flu shot       Relevant Orders   Flu Vaccine QUAD 6+ mos PF IM (Fluarix Quad PF) (Completed)       Follow up plan: Return in about 3 months (around 06/03/2017) for Hemoglobin A1c, BMP,  Lipids, ALT, AST.

## 2017-03-03 NOTE — Assessment & Plan Note (Addendum)
With weight-loss good glucose control unable to do A1c here will send out get that report back later this week. With low blood sugar spells will reduce Januvia from 100 mg to 50 mg by taking half a tablet a day. May need to further decrease pending patient's response. Patient will continue diet exercise nutrition plan

## 2017-03-04 ENCOUNTER — Encounter: Payer: Self-pay | Admitting: Family Medicine

## 2017-03-04 LAB — BAYER DCA HB A1C WAIVED: HB A1C (BAYER DCA - WAIVED): 5.2 % (ref <7.0)

## 2017-05-05 ENCOUNTER — Telehealth: Payer: Self-pay | Admitting: Family Medicine

## 2017-05-05 NOTE — Telephone Encounter (Signed)
[facility] sent refill request for patients Farxiga 10mg     Thanks  Fax [phone]

## 2017-05-06 MED ORDER — DAPAGLIFLOZIN PROPANEDIOL 10 MG PO TABS: 10.0000 mg | ORAL_TABLET | Freq: Every day | ORAL | 3 refills | Status: DC

## 2017-06-03 ENCOUNTER — Encounter: Payer: Self-pay | Admitting: Family Medicine

## 2017-06-03 ENCOUNTER — Ambulatory Visit: Payer: Managed Care, Other (non HMO) | Admitting: Family Medicine

## 2017-06-03 VITALS — BP 146/89 | HR 73 | Wt 287.0 lb

## 2017-06-03 DIAGNOSIS — I10 Essential (primary) hypertension: Secondary | ICD-10-CM | POA: Diagnosis not present

## 2017-06-03 DIAGNOSIS — E7800 Pure hypercholesterolemia, unspecified: Secondary | ICD-10-CM

## 2017-06-03 DIAGNOSIS — E119 Type 2 diabetes mellitus without complications: Secondary | ICD-10-CM | POA: Diagnosis not present

## 2017-06-03 NOTE — Progress Notes (Signed)
   BP (!) 146/89   Pulse 73   Wt 287 lb (130.2 kg)   SpO2 96%   BMI 37.13 kg/m    Subjective:    Patient ID: Cody Burke, male    DOB: 1965/07/04, 52 y.o.   MRN: [id number]  HPI: Vihan Santagata is a 52 y.o. male  Phone call Discussed with patient with weight loss havingexcellent control of diabetes noted low blood sugar spells has gone up a little bit with the holidays and holiday eating and less weight loss. With weight gain blood pressure and cholesterol gone up. Still taking medications without any issues.  Relevant past medical, surgical, family and social history reviewed and updated as indicated. Interim medical history since our last visit reviewed. Allergies and medications reviewed and updated.  Review of Systems  Constitutional: Negative.   Respiratory: Negative.   Cardiovascular: Negative.     Per HPI unless specifically indicated above     Objective:    BP (!) 146/89   Pulse 73   Wt 287 lb (130.2 kg)   SpO2 96%   BMI 37.13 kg/m   Wt Readings from Last 3 Encounters:  06/03/17 287 lb (130.2 kg)  03/03/17 290 lb (131.5 kg)  11/24/16 (!) 306 lb (138.8 kg)    Physical Exam  Constitutional: He is oriented to person, place, and time. He appears well-developed and well-nourished.  HENT:  Head: Normocephalic and atraumatic.  Eyes: Conjunctivae and EOM are normal.  Neck: Normal range of motion.  Cardiovascular: Normal rate, regular rhythm and normal heart sounds.  Pulmonary/Chest: Effort normal and breath sounds normal.  Musculoskeletal: Normal range of motion.  Neurological: He is alert and oriented to person, place, and time.  Skin: No erythema.  Small papule left flank area  Psychiatric: He has a normal mood and affect. His behavior is normal. Judgment and thought content normal.    Results for orders placed or performed in visit on 03/03/17  Bayer DCA Hb A1c Waived  Result Value Ref Range   Bayer DCA Hb A1c Waived 5.2 <7.0 %      Assessment & Plan:    Problem List Items Addressed This Visit      Cardiovascular and Mediastinum   Hypertension - Primary    levated but patient has had control with lifestyle measures which are restarting of the holidays are overwill observe and recheck 3 months      Relevant Orders   Basic metabolic panel   Bayer DCA Hb A1c Waived   LP+ALT+AST Piccolo, Waived     Endocrine   Diabetes mellitus without complication ([place])    Excellent control just discussed decreasing medicines if necessary with weight lossand low blood sugar spells.      Relevant Orders   Basic metabolic panel   Bayer DCA Hb A1c Waived   LP+ALT+AST Piccolo, Waived     Other   Hyperlipidemia    Discussed need for improved control patient will obtain with better diet exercise and observe recheck next visit      Relevant Orders   Basic metabolic panel   Bayer DCA Hb A1c Waived   LP+ALT+AST Piccolo, Waived       Follow up plan: Return in about 3 months (around 09/01/2017) for Hemoglobin A1c,  Lipids, ALT, AST.

## 2017-06-03 NOTE — Assessment & Plan Note (Signed)
levated but patient has had control with lifestyle measures which are restarting of the holidays are overwill observe and recheck 3 months

## 2017-06-03 NOTE — Assessment & Plan Note (Signed)
Discussed need for improved control patient will obtain with better diet exercise and observe recheck next visit

## 2017-06-03 NOTE — Assessment & Plan Note (Signed)
Excellent control just discussed decreasing medicines if necessary with weight lossand low blood sugar spells.

## 2017-06-04 ENCOUNTER — Encounter: Payer: Self-pay | Admitting: Family Medicine

## 2017-06-04 LAB — LP+ALT+AST PICCOLO, WAIVED
ALT (SGPT) Piccolo, Waived: 41 U/L (ref 10–47)
AST (SGOT) Piccolo, Waived: 36 U/L (ref 11–38)
CHOLESTEROL PICCOLO, WAIVED: 174 mg/dL (ref <200)
Chol/HDL Ratio Piccolo,Waive: 3.9 mg/dL
HDL CHOL PICCOLO, WAIVED: 44 mg/dL — AB (ref >59)
LDL CHOL CALC PICCOLO WAIVED: 91 mg/dL (ref <100)
TRIGLYCERIDES PICCOLO,WAIVED: 190 mg/dL — AB (ref <150)
VLDL Chol Calc Piccolo,Waive: 38 mg/dL — ABNORMAL HIGH (ref <30)

## 2017-06-04 LAB — BASIC METABOLIC PANEL
BUN / CREAT RATIO: 16 (ref 9–20)
BUN: 18 mg/dL (ref 6–24)
CO2: 25 mmol/L (ref 20–29)
CREATININE: 1.13 mg/dL (ref 0.76–1.27)
Calcium: 9.2 mg/dL (ref 8.7–10.2)
Chloride: 99 mmol/L (ref 96–106)
GFR, EST AFRICAN AMERICAN: 85 mL/min/{1.73_m2} (ref >59)
GFR, EST NON AFRICAN AMERICAN: 74 mL/min/{1.73_m2} (ref >59)
Glucose: 82 mg/dL (ref 65–99)
Potassium: 4.2 mmol/L (ref 3.5–5.2)
Sodium: 138 mmol/L (ref 134–144)

## 2017-06-04 LAB — BAYER DCA HB A1C WAIVED: HB A1C: 5.4 % (ref <7.0)

## 2017-09-02 ENCOUNTER — Encounter: Payer: Self-pay | Admitting: Family Medicine

## 2017-09-02 ENCOUNTER — Ambulatory Visit: Payer: Managed Care, Other (non HMO) | Admitting: Family Medicine

## 2017-09-02 VITALS — BP 119/79 | HR 73 | Ht 74.0 in | Wt 283.0 lb

## 2017-09-02 DIAGNOSIS — E119 Type 2 diabetes mellitus without complications: Secondary | ICD-10-CM

## 2017-09-02 DIAGNOSIS — G4733 Obstructive sleep apnea (adult) (pediatric): Secondary | ICD-10-CM | POA: Diagnosis not present

## 2017-09-02 DIAGNOSIS — I10 Essential (primary) hypertension: Secondary | ICD-10-CM

## 2017-09-02 DIAGNOSIS — E7849 Other hyperlipidemia: Secondary | ICD-10-CM

## 2017-09-02 LAB — LP+ALT+AST PICCOLO, WAIVED
ALT (SGPT) PICCOLO, WAIVED: 41 U/L (ref 10–47)
AST (SGOT) Piccolo, Waived: 38 U/L (ref 11–38)
Chol/HDL Ratio Piccolo,Waive: 2.7 mg/dL
Cholesterol Piccolo, Waived: 128 mg/dL (ref <200)
HDL Chol Piccolo, Waived: 48 mg/dL — ABNORMAL LOW (ref >59)
LDL CHOL CALC PICCOLO WAIVED: 48 mg/dL (ref <100)
Triglycerides Piccolo,Waived: 160 mg/dL — ABNORMAL HIGH (ref <150)
VLDL CHOL CALC PICCOLO,WAIVE: 32 mg/dL — AB (ref <30)

## 2017-09-02 LAB — BAYER DCA HB A1C WAIVED: HB A1C: 5.6 % (ref <7.0)

## 2017-09-02 MED ORDER — GLUCOSE BLOOD VI STRP: ORAL_STRIP | 4 refills | Status: DC

## 2017-09-02 NOTE — Progress Notes (Signed)
BP 119/79   Pulse 73   Ht 6\' 2"  (1.88 m)   Wt 283 lb (128.4 kg)   SpO2 98%   BMI 36.34 kg/m    Subjective:    Patient ID: Cody Burke, male    DOB: May 09, 1966, 52 y.o.   MRN: [id number]  HPI: Cody Burke is a 52 y.o. male  Chief Complaint  Patient presents with  . Follow-up  . Hypertension  . Hyperlipidemia  . Diabetes  Patient follow-up doing well no complaints from medications taking atorvastatin without issues for cholesterol. Blood pressure good control with benazepril 40 mg. Diabetes good control with no low blood sugar spells with Wilder Glade and metformin.   Relevant past medical, surgical, family and social history reviewed and updated as indicated. Interim medical history since our last visit reviewed. Allergies and medications reviewed and updated.  Review of Systems  Constitutional: Negative.   Respiratory: Negative.   Cardiovascular: Negative.     Per HPI unless specifically indicated above     Objective:    BP 119/79   Pulse 73   Ht 6\' 2"  (1.88 m)   Wt 283 lb (128.4 kg)   SpO2 98%   BMI 36.34 kg/m   Wt Readings from Last 3 Encounters:  09/02/17 283 lb (128.4 kg)  06/03/17 287 lb (130.2 kg)  03/03/17 290 lb (131.5 kg)    Physical Exam  Constitutional: He is oriented to person, place, and time. He appears well-developed and well-nourished. No distress.  HENT:  Head: Normocephalic and atraumatic.  Right Ear: Hearing normal.  Left Ear: Hearing normal.  Nose: Nose normal.  Eyes: Conjunctivae and lids are normal. Right eye exhibits no discharge. Left eye exhibits no discharge. No scleral icterus.  Pulmonary/Chest: Effort normal. No respiratory distress.  Musculoskeletal: Normal range of motion.  Neurological: He is alert and oriented to person, place, and time.  Skin: Skin is intact. No rash noted.  Psychiatric: He has a normal mood and affect. His speech is normal and behavior is normal. Judgment and thought content normal. Cognition and memory are  normal.    Results for orders placed or performed in visit on 09/02/17  Bayer DCA Hb A1c Waived  Result Value Ref Range   Bayer DCA Hb A1c Waived 5.6 <7.0 %  LP+ALT+AST Piccolo, Waived  Result Value Ref Range   ALT (SGPT) Piccolo, Waived 41 10 - 47 U/L   AST (SGOT) Piccolo, Waived 38 11 - 38 U/L   Cholesterol Piccolo, Waived 128 <200 mg/dL   HDL Chol Piccolo, Waived 48 (L) >59 mg/dL   Triglycerides Piccolo,Waived 160 (H) <150 mg/dL   Chol/HDL Ratio Piccolo,Waive 2.7 mg/dL   LDL Chol Calc Piccolo Waived 48 <100 mg/dL   VLDL Chol Calc Piccolo,Waive 32 (H) <30 mg/dL      Assessment & Plan:   Problem List Items Addressed This Visit      Cardiovascular and Mediastinum   Hypertension    The current medical regimen is effective;  continue present plan and medications.         Respiratory   Sleep apnea    Discussed not want to use CPAP        Endocrine   Diabetes mellitus without complication ([place]) - Primary    The current medical regimen is effective;  continue present plan and medications.       Relevant Orders   Bayer DCA Hb A1c Waived (Completed)   LP+ALT+AST Piccolo, [employer] (Completed)     Other  Hyperlipidemia    The current medical regimen is effective;  continue present plan and medications.       Relevant Orders   Bayer DCA Hb A1c Waived (Completed)   LP+ALT+AST Piccolo, Waived (Completed)       Follow up plan: Return in about 3 months (around 12/02/2017) for Hemoglobin A1c.

## 2017-09-02 NOTE — Assessment & Plan Note (Signed)
The current medical regimen is effective;  continue present plan and medications.  

## 2017-09-02 NOTE — Assessment & Plan Note (Signed)
Discussed not want to use CPAP

## 2017-09-11 ENCOUNTER — Other Ambulatory Visit: Payer: Self-pay | Admitting: Family Medicine

## 2017-09-14 ENCOUNTER — Encounter: Payer: Self-pay | Admitting: Family Medicine

## 2017-09-14 ENCOUNTER — Ambulatory Visit: Payer: Managed Care, Other (non HMO) | Admitting: Family Medicine

## 2017-09-14 VITALS — BP 132/87 | HR 88 | Temp 99.2°F | Ht 72.0 in | Wt 287.0 lb

## 2017-09-14 DIAGNOSIS — J019 Acute sinusitis, unspecified: Secondary | ICD-10-CM

## 2017-09-14 MED ORDER — AMOXICILLIN-POT CLAVULANATE 875-125 MG PO TABS: 1.0000 | ORAL_TABLET | Freq: Two times a day (BID) | ORAL | 0 refills | Status: DC

## 2017-09-14 NOTE — Progress Notes (Signed)
   BP 132/87   Pulse 88   Temp 99.2 F (37.3 C) (Oral)   Ht 6' (1.829 m)   Wt 287 lb (130.2 kg)   SpO2 97%   BMI 38.92 kg/m    Subjective:    Patient ID: Cody Burke, male    DOB: May 20, 1966, 52 y.o.   MRN: [id number]  HPI: Cody Burke is a 52 y.o. male  Chief Complaint  Patient presents with  . URI    x 1 week  Patient with marked sinus congestion drainage facial pressure is been getting worse over the last weeks trialed over-the-counter medications including Nettie pot with only minimal relief.  Having some fever and just systemic feeling bad and aches.  Relevant past medical, surgical, family and social history reviewed and updated as indicated. Interim medical history since our last visit reviewed. Allergies and medications reviewed and updated.  Review of Systems  Constitutional: Positive for chills, diaphoresis, fatigue and fever.  HENT: Positive for congestion, ear pain, hearing loss, postnasal drip, rhinorrhea, sinus pressure, sinus pain, sneezing and sore throat.   Respiratory: Positive for cough.   Cardiovascular: Negative.     Per HPI unless specifically indicated above     Objective:    BP 132/87   Pulse 88   Temp 99.2 F (37.3 C) (Oral)   Ht 6' (1.829 m)   Wt 287 lb (130.2 kg)   SpO2 97%   BMI 38.92 kg/m   Wt Readings from Last 3 Encounters:  09/14/17 287 lb (130.2 kg)  09/02/17 283 lb (128.4 kg)  06/03/17 287 lb (130.2 kg)    Physical Exam  Constitutional: He is oriented to person, place, and time. He appears well-developed and well-nourished.  HENT:  Head: Normocephalic and atraumatic.  Mouth/Throat: Oropharyngeal exudate present.  Eyes: Conjunctivae and EOM are normal.  Neck: Normal range of motion.  Cardiovascular: Normal rate, regular rhythm and normal heart sounds.  Pulmonary/Chest: Effort normal and breath sounds normal.  Musculoskeletal: Normal range of motion.  Neurological: He is alert and oriented to person, place, and time.  Skin:  No erythema.  Psychiatric: He has a normal mood and affect. His behavior is normal. Judgment and thought content normal.    Results for orders placed or performed in visit on 09/02/17  Bayer DCA Hb A1c Waived  Result Value Ref Range   Bayer DCA Hb A1c Waived 5.6 <7.0 %  LP+ALT+AST Piccolo, Waived  Result Value Ref Range   ALT (SGPT) Piccolo, Waived 41 10 - 47 U/L   AST (SGOT) Piccolo, Waived 38 11 - 38 U/L   Cholesterol Piccolo, Waived 128 <200 mg/dL   HDL Chol Piccolo, Waived 48 (L) >59 mg/dL   Triglycerides Piccolo,Waived 160 (H) <150 mg/dL   Chol/HDL Ratio Piccolo,Waive 2.7 mg/dL   LDL Chol Calc Piccolo Waived 48 <100 mg/dL   VLDL Chol Calc Piccolo,Waive 32 (H) <30 mg/dL      Assessment & Plan:   Problem List Items Addressed This Visit    None    Visit Diagnoses    Acute sinusitis, recurrence not specified, unspecified location    -  Primary   Relevant Medications   amoxicillin-clavulanate (AUGMENTIN) 875-125 MG tablet    Discussed sinusitis care and treatment use of Nettie pot safe use of Augmentin.  Over-the-counter medication such as Mucinex Tylenol etc.  Follow up plan: Return for As scheduled.

## 2017-10-20 ENCOUNTER — Other Ambulatory Visit: Payer: Self-pay | Admitting: Family Medicine

## 2018-01-05 ENCOUNTER — Encounter: Payer: Self-pay | Admitting: Family Medicine

## 2018-01-05 ENCOUNTER — Ambulatory Visit (INDEPENDENT_AMBULATORY_CARE_PROVIDER_SITE_OTHER): Payer: Managed Care, Other (non HMO) | Admitting: Family Medicine

## 2018-01-05 ENCOUNTER — Other Ambulatory Visit: Payer: Self-pay

## 2018-01-05 VITALS — BP 169/98 | HR 65 | Temp 98.4°F | Ht 72.0 in | Wt 296.0 lb

## 2018-01-05 DIAGNOSIS — Z6841 Body Mass Index (BMI) 40.0 and over, adult: Secondary | ICD-10-CM

## 2018-01-05 DIAGNOSIS — G4733 Obstructive sleep apnea (adult) (pediatric): Secondary | ICD-10-CM | POA: Diagnosis not present

## 2018-01-05 DIAGNOSIS — I10 Essential (primary) hypertension: Secondary | ICD-10-CM | POA: Diagnosis not present

## 2018-01-05 DIAGNOSIS — Z23 Encounter for immunization: Secondary | ICD-10-CM

## 2018-01-05 DIAGNOSIS — L84 Corns and callosities: Secondary | ICD-10-CM

## 2018-01-05 DIAGNOSIS — E119 Type 2 diabetes mellitus without complications: Secondary | ICD-10-CM | POA: Diagnosis not present

## 2018-01-05 DIAGNOSIS — E7800 Pure hypercholesterolemia, unspecified: Secondary | ICD-10-CM

## 2018-01-05 DIAGNOSIS — Z0000 Encounter for general adult medical examination without abnormal findings: Secondary | ICD-10-CM

## 2018-01-05 LAB — URINALYSIS, ROUTINE W REFLEX MICROSCOPIC
BILIRUBIN UA: NEGATIVE
Glucose, UA: NEGATIVE
KETONES UA: NEGATIVE
LEUKOCYTES UA: NEGATIVE
Nitrite, UA: NEGATIVE
PROTEIN UA: NEGATIVE
Specific Gravity, UA: 1.01 (ref 1.005–1.030)
Urobilinogen, Ur: 0.2 mg/dL (ref 0.2–1.0)
pH, UA: 6.5 (ref 5.0–7.5)

## 2018-01-05 LAB — MICROSCOPIC EXAMINATION: Bacteria, UA: NONE SEEN

## 2018-01-05 LAB — BAYER DCA HB A1C WAIVED: HB A1C (BAYER DCA - WAIVED): 5.8 % (ref <7.0)

## 2018-01-05 MED ORDER — DAPAGLIFLOZIN PROPANEDIOL 10 MG PO TABS: 10.0000 mg | ORAL_TABLET | Freq: Every day | ORAL | 4 refills | Status: DC

## 2018-01-05 MED ORDER — ATORVASTATIN CALCIUM 20 MG PO TABS: 20.0000 mg | ORAL_TABLET | Freq: Every day | ORAL | 4 refills | Status: DC

## 2018-01-05 MED ORDER — METFORMIN HCL 500 MG PO TABS: 1000.0000 mg | ORAL_TABLET | Freq: Two times a day (BID) | ORAL | 4 refills | Status: DC

## 2018-01-05 MED ORDER — BENAZEPRIL HCL 40 MG PO TABS: 40.0000 mg | ORAL_TABLET | Freq: Every day | ORAL | 4 refills | Status: DC

## 2018-01-05 NOTE — Progress Notes (Signed)
BP (!) 169/98 (BP Location: Left Arm, Cuff Size: Normal)   Pulse 65   Temp 98.4 F (36.9 C) (Oral)   Ht 6' (1.829 m)   Wt 296 lb (134.3 kg)   SpO2 100%   BMI 40.14 kg/m    Subjective:    Patient ID: Cody Burke, male    DOB: Oct 09, 1965, 52 y.o.   MRN: [id number]  HPI: Cody Burke is a 52 y.o. male  Annual Exam   Has not taken blood pressure medicine for about 3 weeks.  As has been out. Patient has taken metformin but is been out of for [employer] and atorvastatin. Seems to be doing well otherwise.  With no specific complaints. Patient also has gained some weight but will blame it on the grandchildren  Relevant past medical, surgical, family and social history reviewed and updated as indicated. Interim medical history since our last visit reviewed. Allergies and medications reviewed and updated.  Review of Systems  Constitutional: Negative.   HENT: Negative.   Eyes: Negative.   Respiratory: Negative.   Cardiovascular: Negative.   Gastrointestinal: Negative.   Endocrine: Negative.   Genitourinary: Negative.   Musculoskeletal: Negative.   Skin: Negative.   Allergic/Immunologic: Negative.   Neurological: Negative.   Hematological: Negative.   Psychiatric/Behavioral: Negative.     Per HPI unless specifically indicated above     Objective:    BP (!) 169/98 (BP Location: Left Arm, Cuff Size: Normal)   Pulse 65   Temp 98.4 F (36.9 C) (Oral)   Ht 6' (1.829 m)   Wt 296 lb (134.3 kg)   SpO2 100%   BMI 40.14 kg/m   Wt Readings from Last 3 Encounters:  01/05/18 296 lb (134.3 kg)  09/14/17 287 lb (130.2 kg)  09/02/17 283 lb (128.4 kg)    Physical Exam  Constitutional: He is oriented to person, place, and time. He appears well-developed and well-nourished.  HENT:  Head: Normocephalic and atraumatic.  Right Ear: External ear normal.  Left Ear: External ear normal.  Eyes: Pupils are equal, round, and reactive to light. Conjunctivae and EOM are normal.  Neck: Normal  range of motion. Neck supple.  Cardiovascular: Normal rate, regular rhythm, normal heart sounds and intact distal pulses.  Pulmonary/Chest: Effort normal and breath sounds normal.  Abdominal: Soft. Bowel sounds are normal. There is no splenomegaly or hepatomegaly.  Genitourinary: Rectum normal, prostate normal and penis normal.  Musculoskeletal: Normal range of motion.  Neurological: He is alert and oriented to person, place, and time. He has normal reflexes.  Skin: No rash noted. No erythema.  Has large callus on bottom of left foot which was shaved with a 15 blade with significant reduction in size and reduction in discomfort. Lesion 1.8cm. Patient tolerated procedure well.  Psychiatric: He has a normal mood and affect. His behavior is normal. Judgment and thought content normal.    Results for orders placed or performed in visit on 09/02/17  Bayer DCA Hb A1c Waived  Result Value Ref Range   HB A1C (BAYER DCA - WAIVED) 5.6 <7.0 %  LP+ALT+AST Piccolo, Waived  Result Value Ref Range   ALT (SGPT) Piccolo, Waived 41 10 - 47 U/L   AST (SGOT) Piccolo, Waived 38 11 - 38 U/L   Cholesterol Piccolo, Waived 128 <200 mg/dL   HDL Chol Piccolo, Waived 48 (L) >59 mg/dL   Triglycerides Piccolo,Waived 160 (H) <150 mg/dL   Chol/HDL Ratio Piccolo,Waive 2.7 mg/dL   LDL Chol Calc Piccolo Waived 48 <  100 mg/dL   VLDL Chol Calc Piccolo,Waive 32 (H) <30 mg/dL      Assessment & Plan:   Problem List Items Addressed This Visit      Cardiovascular and Mediastinum   Hypertension    Discussed poor control but apparently good control while taking medications will restart medications        Respiratory   Sleep apnea     Endocrine   Diabetes mellitus without complication ([place])    The current medical regimen is effective;  continue present plan and medications.       Relevant Orders   Bayer DCA Hb A1c Waived     Other   Hyperlipidemia    Labs pending      BMI 40.0-44.9, adult ([place])     Discussed diet and wt loss       Other Visit Diagnoses    Annual physical exam    -  Primary   Relevant Orders   CBC with Differential/Platelet   Comprehensive metabolic panel   TSH   PSA   Urinalysis, Routine w reflex microscopic   Need for pneumococcal vaccination       Relevant Orders   Pneumococcal polysaccharide vaccine 23-valent greater than or equal to 2yo subcutaneous/IM (Completed)       Follow up plan: Return in about 3 months (around 04/07/2018) for BP and , Hemoglobin A1c.

## 2018-01-05 NOTE — Assessment & Plan Note (Signed)
Discussed poor control but apparently good control while taking medications will restart medications

## 2018-01-05 NOTE — Assessment & Plan Note (Signed)
Discussed diet and wt loss

## 2018-01-05 NOTE — Assessment & Plan Note (Signed)
Labs pending.  

## 2018-01-05 NOTE — Patient Instructions (Signed)

## 2018-01-05 NOTE — Assessment & Plan Note (Signed)
The current medical regimen is effective;  continue present plan and medications.  

## 2018-01-06 ENCOUNTER — Encounter: Payer: Self-pay | Admitting: Family Medicine

## 2018-01-06 LAB — COMPREHENSIVE METABOLIC PANEL
A/G RATIO: 1.8 (ref 1.2–2.2)
ALT: 33 IU/L (ref 0–44)
AST: 22 IU/L (ref 0–40)
Albumin: 4.4 g/dL (ref 3.5–5.5)
Alkaline Phosphatase: 81 IU/L (ref 39–117)
BILIRUBIN TOTAL: 0.5 mg/dL (ref 0.0–1.2)
BUN/Creatinine Ratio: 16 (ref 9–20)
BUN: 14 mg/dL (ref 6–24)
CHLORIDE: 100 mmol/L (ref 96–106)
CO2: 24 mmol/L (ref 20–29)
Calcium: 9.5 mg/dL (ref 8.7–10.2)
Creatinine, Ser: 0.87 mg/dL (ref 0.76–1.27)
GFR, EST AFRICAN AMERICAN: 114 mL/min/{1.73_m2} (ref >59)
GFR, EST NON AFRICAN AMERICAN: 99 mL/min/{1.73_m2} (ref >59)
GLOBULIN, TOTAL: 2.5 g/dL (ref 1.5–4.5)
Glucose: 133 mg/dL — ABNORMAL HIGH (ref 65–99)
POTASSIUM: 4.7 mmol/L (ref 3.5–5.2)
SODIUM: 140 mmol/L (ref 134–144)
TOTAL PROTEIN: 6.9 g/dL (ref 6.0–8.5)

## 2018-01-06 LAB — CBC WITH DIFFERENTIAL/PLATELET
BASOS: 0 %
Basophils Absolute: 0 10*3/uL (ref 0.0–0.2)
EOS (ABSOLUTE): 0.3 10*3/uL (ref 0.0–0.4)
EOS: 3 %
HEMATOCRIT: 49.1 % (ref 37.5–51.0)
Hemoglobin: 16.3 g/dL (ref 13.0–17.7)
IMMATURE GRANULOCYTES: 1 %
Immature Grans (Abs): 0.1 10*3/uL (ref 0.0–0.1)
LYMPHS ABS: 4.3 10*3/uL — AB (ref 0.7–3.1)
Lymphs: 48 %
MCH: 29.6 pg (ref 26.6–33.0)
MCHC: 33.2 g/dL (ref 31.5–35.7)
MCV: 89 fL (ref 79–97)
MONOS ABS: 0.9 10*3/uL (ref 0.1–0.9)
Monocytes: 10 %
NEUTROS ABS: 3.4 10*3/uL (ref 1.4–7.0)
NEUTROS PCT: 38 %
Platelets: 230 10*3/uL (ref 150–450)
RBC: 5.5 x10E6/uL (ref 4.14–5.80)
RDW: 14.4 % (ref 12.3–15.4)
WBC: 9 10*3/uL (ref 3.4–10.8)

## 2018-01-06 LAB — TSH: TSH: 3.12 u[IU]/mL (ref 0.450–4.500)

## 2018-01-06 LAB — PSA: Prostate Specific Ag, Serum: <0.1 ng/mL (ref 0.0–4.0)

## 2018-01-22 ENCOUNTER — Telehealth: Payer: Self-pay | Admitting: Family Medicine

## 2018-01-22 NOTE — Telephone Encounter (Unsigned)
Copied from [id number]. Topic: Quick Communication - See Telephone Encounter >> Jan 21, 2018  9:51 AM Gardiner Ramus wrote: CRM for notification. See Telephone encounter for: 01/21/18. Pt wife calling to check status of medical record release. Pt wife would like to know if records have been released. >> Jan 21, 2018  2:53 PM Synthia Innocent wrote: [facility] checking status of release, will re-fax >> Jan 21, 2018  3:38 PM Shaune Pollack wrote: Spoke with patient to update him on the status of the medical release form   Just FYI

## 2018-04-14 ENCOUNTER — Encounter: Payer: Self-pay | Admitting: Family Medicine

## 2018-04-14 ENCOUNTER — Ambulatory Visit (INDEPENDENT_AMBULATORY_CARE_PROVIDER_SITE_OTHER): Payer: Managed Care, Other (non HMO) | Admitting: Family Medicine

## 2018-04-14 ENCOUNTER — Other Ambulatory Visit: Payer: Self-pay

## 2018-04-14 VITALS — BP 132/80 | HR 75 | Temp 98.7°F | Ht 73.0 in | Wt 299.0 lb

## 2018-04-14 DIAGNOSIS — I10 Essential (primary) hypertension: Secondary | ICD-10-CM

## 2018-04-14 DIAGNOSIS — E7800 Pure hypercholesterolemia, unspecified: Secondary | ICD-10-CM

## 2018-04-14 DIAGNOSIS — L03311 Cellulitis of abdominal wall: Secondary | ICD-10-CM | POA: Insufficient documentation

## 2018-04-14 DIAGNOSIS — Z23 Encounter for immunization: Secondary | ICD-10-CM

## 2018-04-14 DIAGNOSIS — E119 Type 2 diabetes mellitus without complications: Secondary | ICD-10-CM | POA: Diagnosis not present

## 2018-04-14 LAB — BAYER DCA HB A1C WAIVED: HB A1C (BAYER DCA - WAIVED): 5.8 % (ref <7.0)

## 2018-04-14 MED ORDER — MUPIROCIN CALCIUM 2 % EX CREA: 1.0000 "application " | TOPICAL_CREAM | Freq: Two times a day (BID) | CUTANEOUS | 0 refills | Status: DC

## 2018-04-14 NOTE — Assessment & Plan Note (Signed)
The current medical regimen is effective;  continue present plan and medications.  

## 2018-04-14 NOTE — Assessment & Plan Note (Signed)
Discuss wt loss

## 2018-04-14 NOTE — Assessment & Plan Note (Signed)
Discussed care and treatment hot compresses change in bandage use of Bactroban ointment

## 2018-04-14 NOTE — Progress Notes (Signed)
BP 132/80 (BP Location: Left Arm)   Pulse 75   Temp 98.7 F (37.1 C) (Oral)   Ht 6\' 1"  (1.854 m)   Wt 299 lb (135.6 kg)   SpO2 95%   BMI 39.45 kg/m    Subjective:    Patient ID: Cody Burke, male    DOB: [birth year], 52 y.o.   MRN: [id number]  HPI: Cody Burke is a 52 y.o. male  Chief Complaint  Patient presents with  . Diabetes    f/u  . Hypertension  Patient under a great deal of stress at work which seems to aggravate his blood pressure blood pressure on repeat readings is normal. Diabetes seems to be doing okay struggling with weight loss and wants to start next year. Patient also has a area that turned red with some blistering and now is gotten infected in his right lower abdomen area.  Relevant past medical, surgical, family and social history reviewed and updated as indicated. Interim medical history since our last visit reviewed. Allergies and medications reviewed and updated.  Review of Systems  Constitutional: Negative.   HENT: Negative.   Eyes: Negative.   Respiratory: Negative.   Cardiovascular: Negative.   Gastrointestinal: Negative.   Endocrine: Negative.   Genitourinary: Negative.   Musculoskeletal: Negative.   Skin: Negative.   Allergic/Immunologic: Negative.   Neurological: Negative.   Hematological: Negative.   Psychiatric/Behavioral: Negative.     Per HPI unless specifically indicated above     Objective:    BP 132/80 (BP Location: Left Arm)   Pulse 75   Temp 98.7 F (37.1 C) (Oral)   Ht 6\' 1"  (1.854 m)   Wt 299 lb (135.6 kg)   SpO2 95%   BMI 39.45 kg/m   Wt Readings from Last 3 Encounters:  04/14/18 299 lb (135.6 kg)  01/05/18 296 lb (134.3 kg)  09/14/17 287 lb (130.2 kg)    Physical Exam  Constitutional: He is oriented to person, place, and time. He appears well-developed and well-nourished.  HENT:  Head: Normocephalic and atraumatic.  Right Ear: External ear normal.  Left Ear: External ear normal.  Eyes: Pupils are equal,  round, and reactive to light. Conjunctivae and EOM are normal.  Neck: Normal range of motion. Neck supple.  Cardiovascular: Normal rate, regular rhythm and normal heart sounds.  Pulmonary/Chest: Effort normal and breath sounds normal.  Abdominal: There is no splenomegaly or hepatomegaly.  Neurological: He is alert and oriented to person, place, and time. He has normal reflexes.  Skin: No rash noted. No erythema.  Psychiatric: He has a normal mood and affect. His behavior is normal. Judgment and thought content normal.    Results for orders placed or performed in visit on 04/14/18  Bayer DCA Hb A1c Waived  Result Value Ref Range   HB A1C (BAYER DCA - WAIVED) 5.8 <7.0 %      Assessment & Plan:   Problem List Items Addressed This Visit      Cardiovascular and Mediastinum   Hypertension    The current medical regimen is effective;  continue present plan and medications.         Endocrine   Diabetes mellitus without complication ([place]) - Primary   Relevant Orders   Bayer DCA Hb A1c Waived (Completed)     Other   Hyperlipidemia    The current medical regimen is effective;  continue present plan and medications.       Morbid obesity ([place])    Discuss wt loss  Cellulitis of right abdominal wall    Discussed care and treatment hot compresses change in bandage use of Bactroban ointment       Other Visit Diagnoses    Flu vaccine need       Relevant Orders   Flu Vaccine QUAD 36+ mos IM (Completed)       Follow up plan: Return in about 3 months (around 07/15/2018) for Hemoglobin A1c, BMP,  Lipids, ALT, AST.

## 2018-07-28 ENCOUNTER — Ambulatory Visit: Payer: No Typology Code available for payment source | Admitting: Family Medicine

## 2018-07-28 ENCOUNTER — Encounter: Payer: Self-pay | Admitting: Family Medicine

## 2018-07-28 VITALS — BP 136/78 | HR 81 | Temp 98.4°F | Wt 309.0 lb

## 2018-07-28 DIAGNOSIS — E119 Type 2 diabetes mellitus without complications: Secondary | ICD-10-CM | POA: Diagnosis not present

## 2018-07-28 DIAGNOSIS — E7800 Pure hypercholesterolemia, unspecified: Secondary | ICD-10-CM | POA: Diagnosis not present

## 2018-07-28 DIAGNOSIS — I10 Essential (primary) hypertension: Secondary | ICD-10-CM

## 2018-07-28 LAB — LP+ALT+AST PICCOLO, WAIVED
ALT (SGPT) Piccolo, Waived: 42 U/L (ref 10–47)
AST (SGOT) Piccolo, Waived: 43 U/L — ABNORMAL HIGH (ref 11–38)
Chol/HDL Ratio Piccolo,Waive: 3.3 mg/dL
Cholesterol Piccolo, Waived: 168 mg/dL (ref <200)
HDL Chol Piccolo, Waived: 50 mg/dL — ABNORMAL LOW (ref >59)
LDL Chol Calc Piccolo Waived: 54 mg/dL (ref <100)
Triglycerides Piccolo,Waived: 319 mg/dL — ABNORMAL HIGH (ref <150)
VLDL Chol Calc Piccolo,Waive: 64 mg/dL — ABNORMAL HIGH (ref <30)

## 2018-07-28 LAB — BAYER DCA HB A1C WAIVED: HB A1C: 6.7 % (ref <7.0)

## 2018-07-28 MED ORDER — ATORVASTATIN CALCIUM 20 MG PO TABS: 20.0000 mg | ORAL_TABLET | Freq: Every day | ORAL | 3 refills | Status: DC

## 2018-07-28 MED ORDER — CANAGLIFLOZIN 300 MG PO TABS: 300.0000 mg | ORAL_TABLET | Freq: Every day | ORAL | 3 refills | Status: DC

## 2018-07-28 MED ORDER — METFORMIN HCL 500 MG PO TABS: 1000.0000 mg | ORAL_TABLET | Freq: Two times a day (BID) | ORAL | 3 refills | Status: DC

## 2018-07-28 MED ORDER — BENAZEPRIL HCL 40 MG PO TABS: 40.0000 mg | ORAL_TABLET | Freq: Every day | ORAL | 3 refills | Status: DC

## 2018-07-28 NOTE — Progress Notes (Signed)
BP 136/78 (BP Location: Left Arm)   Pulse 81   Temp 98.4 F (36.9 C) (Oral)   Wt (!) 309 lb (140.2 kg)   SpO2 96%   BMI 40.77 kg/m    Subjective:    Patient ID: Cody Burke, male    DOB: 1966-02-05, 53 y.o.   MRN: [id number]  HPI: Cody Burke is a 53 y.o. male  Chief Complaint  Patient presents with  . Diabetes    recent eye exam with Dr. Ellin Mayhew  . Hyperlipidemia  . Hypertension  Patient all in all doing well no complaints except for [place] is not covered by his insurance anymore needs to go to [place]. Cholesterol doing well with no complaints same with blood pressure good control no complaints.  Relevant past medical, surgical, family and social history reviewed and updated as indicated. Interim medical history since our last visit reviewed. Allergies and medications reviewed and updated.  Review of Systems  Constitutional: Negative.   Respiratory: Negative.   Cardiovascular: Negative.     Per HPI unless specifically indicated above     Objective:    BP 136/78 (BP Location: Left Arm)   Pulse 81   Temp 98.4 F (36.9 C) (Oral)   Wt (!) 309 lb (140.2 kg)   SpO2 96%   BMI 40.77 kg/m   Wt Readings from Last 3 Encounters:  07/28/18 (!) 309 lb (140.2 kg)  04/14/18 299 lb (135.6 kg)  01/05/18 296 lb (134.3 kg)    Physical Exam Constitutional:      Appearance: He is well-developed.  HENT:     Head: Normocephalic and atraumatic.  Eyes:     Conjunctiva/sclera: Conjunctivae normal.  Neck:     Musculoskeletal: Normal range of motion.  Cardiovascular:     Rate and Rhythm: Normal rate and regular rhythm.     Heart sounds: Normal heart sounds.  Pulmonary:     Effort: Pulmonary effort is normal.     Breath sounds: Normal breath sounds.  Musculoskeletal: Normal range of motion.  Skin:    Findings: No erythema.  Neurological:     Mental Status: He is alert and oriented to person, place, and time.  Psychiatric:        Behavior: Behavior normal.      Thought Content: Thought content normal.        Judgment: Judgment normal.     Results for orders placed or performed in visit on 04/14/18  Bayer DCA Hb A1c Waived  Result Value Ref Range   HB A1C (BAYER DCA - WAIVED) 5.8 <7.0 %      Assessment & Plan:   Problem List Items Addressed This Visit      Cardiovascular and Mediastinum   Hypertension   Relevant Medications   atorvastatin (LIPITOR) 20 MG tablet   benazepril (LOTENSIN) 40 MG tablet   Other Relevant Orders   Basic metabolic panel     Endocrine   Diabetes mellitus without complication ([facility]) - Primary   Relevant Medications   metFORMIN (GLUCOPHAGE) 500 MG tablet   atorvastatin (LIPITOR) 20 MG tablet   benazepril (LOTENSIN) 40 MG tablet   canagliflozin (INVOKANA) 300 MG TABS tablet   Other Relevant Orders   Bayer DCA Hb A1c Waived     Other   Hyperlipidemia   Relevant Medications   atorvastatin (LIPITOR) 20 MG tablet   benazepril (LOTENSIN) 40 MG tablet   Other Relevant Orders   LP+ALT+AST Piccolo, Waived       Follow up  plan: Return in about 6 months (around 01/28/2019) for Physical Exam, Hemoglobin A1c.

## 2018-07-29 ENCOUNTER — Encounter: Payer: Self-pay | Admitting: Family Medicine

## 2018-07-29 LAB — BASIC METABOLIC PANEL
BUN/Creatinine Ratio: 17 (ref 9–20)
BUN: 15 mg/dL (ref 6–24)
CO2: 25 mmol/L (ref 20–29)
Calcium: 9.2 mg/dL (ref 8.7–10.2)
Chloride: 100 mmol/L (ref 96–106)
Creatinine, Ser: 0.9 mg/dL (ref 0.76–1.27)
GFR calc Af Amer: 112 mL/min/{1.73_m2} (ref >59)
GFR calc non Af Amer: 96 mL/min/{1.73_m2} (ref >59)
Glucose: 92 mg/dL (ref 65–99)
Potassium: 4.1 mmol/L (ref 3.5–5.2)
Sodium: 137 mmol/L (ref 134–144)

## 2018-11-15 ENCOUNTER — Telehealth: Payer: Self-pay | Admitting: Family Medicine

## 2018-11-15 NOTE — Telephone Encounter (Signed)
Pt's wife called to state that pt's insurance company is requiring a PA through [employer] for: canagliflozin (INVOKANA) 300 MG TABS tablet replacement. Please call: [phone] to complete PA

## 2018-11-15 NOTE — Telephone Encounter (Signed)
Called insurance to initiate PA, [employer] states that they do not require a PA for his. Called and spoke with [place], medication went through with a $30 dollar co-pay, patient notified.

## 2019-02-14 ENCOUNTER — Ambulatory Visit (INDEPENDENT_AMBULATORY_CARE_PROVIDER_SITE_OTHER): Payer: No Typology Code available for payment source | Admitting: Family Medicine

## 2019-02-14 ENCOUNTER — Encounter: Payer: Self-pay | Admitting: Family Medicine

## 2019-02-14 ENCOUNTER — Other Ambulatory Visit: Payer: Self-pay

## 2019-02-14 VITALS — Wt 310.0 lb

## 2019-02-14 DIAGNOSIS — Z0000 Encounter for general adult medical examination without abnormal findings: Secondary | ICD-10-CM | POA: Diagnosis not present

## 2019-02-14 DIAGNOSIS — E7800 Pure hypercholesterolemia, unspecified: Secondary | ICD-10-CM | POA: Diagnosis not present

## 2019-02-14 DIAGNOSIS — I10 Essential (primary) hypertension: Secondary | ICD-10-CM

## 2019-02-14 DIAGNOSIS — E1169 Type 2 diabetes mellitus with other specified complication: Secondary | ICD-10-CM

## 2019-02-14 MED ORDER — EMPAGLIFLOZIN 25 MG PO TABS: 25.0000 mg | ORAL_TABLET | Freq: Every day | ORAL | 4 refills | Status: DC

## 2019-02-14 MED ORDER — ATORVASTATIN CALCIUM 20 MG PO TABS: 20.0000 mg | ORAL_TABLET | Freq: Every day | ORAL | 4 refills | Status: DC

## 2019-02-14 MED ORDER — METFORMIN HCL 500 MG PO TABS: 1000.0000 mg | ORAL_TABLET | Freq: Two times a day (BID) | ORAL | 4 refills | Status: DC

## 2019-02-14 MED ORDER — BENAZEPRIL HCL 40 MG PO TABS: 40.0000 mg | ORAL_TABLET | Freq: Every day | ORAL | 4 refills | Status: DC

## 2019-02-14 NOTE — Progress Notes (Addendum)
Wt (!) 310 lb (140.6 kg)   BMI 40.90 kg/m    Subjective:    Patient ID: Cody Burke, male    DOB: 1965-09-04, 54 y.o.   MRN: [id number]  HPI: Cody Burke is a 53 y.o. male  Med check Discussed with patient all in all doing well blood pressure doing well with no concerns good control. Diabetes no low blood sugar spells quit taking Invokana because insurance quit covering it has been recommended to take Jardiance which patient has not started yet it is been about a month since he has been off [place]. Cholesterol doing well without problems. Has not done anything about sleep apnea is not bothered has no daytime drowsiness and does not want to use a CPAP machine.   Relevant past medical, surgical, family and social history reviewed and updated as indicated. Interim medical history since our last visit reviewed. Allergies and medications reviewed and updated.  Review of Systems  Constitutional: Negative.   HENT: Negative.   Eyes: Negative.   Respiratory: Negative.   Cardiovascular: Negative.   Gastrointestinal: Negative.   Endocrine: Negative.   Genitourinary: Negative.   Musculoskeletal: Negative.   Skin: Negative.   Allergic/Immunologic: Negative.   Neurological: Negative.   Hematological: Negative.   Psychiatric/Behavioral: Negative.     Per HPI unless specifically indicated above     Objective:    Wt (!) 310 lb (140.6 kg)   BMI 40.90 kg/m   Wt Readings from Last 3 Encounters:  02/14/19 (!) 310 lb (140.6 kg)  07/28/18 (!) 309 lb (140.2 kg)  04/14/18 299 lb (135.6 kg)    Physical Exam  Results for orders placed or performed in visit on 07/28/18  Bayer DCA Hb A1c Waived  Result Value Ref Range   HB A1C (BAYER DCA - WAIVED) 6.7 [id number] %  Basic metabolic panel  Result Value Ref Range   Glucose 92 65 - 99 mg/dL   BUN 15 6 - 24 mg/dL   Creatinine, Ser 0.90 0.76 - 1.27 mg/dL   GFR calc non Af Amer 96 >59 mL/min/1.73   GFR calc Af Amer 112 >59 mL/min/1.73   BUN/Creatinine Ratio 17 9 - 20   Sodium 137 134 - 144 mmol/L   Potassium 4.1 3.5 - 5.2 mmol/L   Chloride 100 96 - 106 mmol/L   CO2 25 20 - 29 mmol/L   Calcium 9.2 8.7 - 10.2 mg/dL  LP+ALT+AST Piccolo, Waived  Result Value Ref Range   ALT (SGPT) Piccolo, Waived 42 10 - 47 U/L   AST (SGOT) Piccolo, Waived 43 (H) 11 - 38 U/L   Cholesterol Piccolo, Waived 168 <200 mg/dL   HDL Chol Piccolo, Waived 50 (L) >59 mg/dL   Triglycerides Piccolo,Waived 319 (H) <150 mg/dL   Chol/HDL Ratio Piccolo,Waive 3.3 mg/dL   LDL Chol Calc Piccolo Waived 54 <100 mg/dL   VLDL Chol Calc Piccolo,Waive 64 (H) <30 mg/dL      Assessment & Plan:   Problem List Items Addressed This Visit      Cardiovascular and Mediastinum   Hypertension    The current medical regimen is effective;  continue present plan and medications.       Relevant Medications   benazepril (LOTENSIN) 40 MG tablet   atorvastatin (LIPITOR) 20 MG tablet     Endocrine   Diabetes mellitus associated with hormonal etiology ([place])    Diabetes indeterminate control hemoglobin A1c pending,  Starting Jardiance      Relevant Medications   empagliflozin (JARDIANCE)  25 MG TABS tablet   benazepril (LOTENSIN) 40 MG tablet   atorvastatin (LIPITOR) 20 MG tablet   metFORMIN (GLUCOPHAGE) 500 MG tablet   Other Relevant Orders   Bayer DCA Hb A1c Waived     Other   Hyperlipidemia    The current medical regimen is effective;  continue present plan and medications.       Relevant Medications   benazepril (LOTENSIN) 40 MG tablet   atorvastatin (LIPITOR) 20 MG tablet   Morbid obesity (HCC)    Discussed diet weight loss      Relevant Medications   empagliflozin (JARDIANCE) 25 MG TABS tablet   metFORMIN (GLUCOPHAGE) 500 MG tablet    Other Visit Diagnoses    PE (physical exam), annual    -  Primary   Relevant Orders   Comprehensive metabolic panel   Lipid panel   CBC with Differential/Platelet   TSH   Urinalysis, Routine w reflex  microscopic   PSA      Telemedicine using audio/video telecommunications for a synchronous communication visit. Today's visit due to COVID-19 isolation precautions I connected with and verified that I am speaking with the correct person using two identifiers.   I discussed the limitations, risks, security and privacy concerns of performing an evaluation and management service by telecommunication and the availability of in person appointments. I also discussed with the patient that there may be a patient responsible charge related to this service. The patient expressed understanding and agreed to proceed. The patient's location is home. I am at home.   I discussed the assessment and treatment plan with the patient. The patient was provided an opportunity to ask questions and all were answered. The patient agreed with the plan and demonstrated an understanding of the instructions.   The patient was advised to call back or seek an in-person evaluation if the symptoms worsen or if the condition fails to improve as anticipated.   I provided 21+ minutes of time during this encounter. Follow up plan: Return in about 6 months (around 08/14/2019) for Hemoglobin A1c, BMP,  Lipids, ALT, AST.

## 2019-02-14 NOTE — Assessment & Plan Note (Signed)
The current medical regimen is effective;  continue present plan and medications.  

## 2019-02-14 NOTE — Assessment & Plan Note (Signed)
Discussed diet weight loss 

## 2019-02-14 NOTE — Assessment & Plan Note (Signed)
Diabetes indeterminate control hemoglobin A1c pending,  Starting Jardiance

## 2019-03-07 ENCOUNTER — Encounter: Payer: No Typology Code available for payment source | Admitting: Family Medicine

## 2019-03-08 ENCOUNTER — Ambulatory Visit (INDEPENDENT_AMBULATORY_CARE_PROVIDER_SITE_OTHER): Payer: No Typology Code available for payment source | Admitting: Family Medicine

## 2019-03-08 ENCOUNTER — Encounter: Payer: Self-pay | Admitting: Family Medicine

## 2019-03-08 ENCOUNTER — Other Ambulatory Visit: Payer: Self-pay

## 2019-03-08 VITALS — BP 138/88 | HR 79 | Temp 98.5°F | Ht 72.0 in | Wt 309.0 lb

## 2019-03-08 DIAGNOSIS — Z23 Encounter for immunization: Secondary | ICD-10-CM | POA: Diagnosis not present

## 2019-03-08 DIAGNOSIS — Z114 Encounter for screening for human immunodeficiency virus [HIV]: Secondary | ICD-10-CM

## 2019-03-08 DIAGNOSIS — R350 Frequency of micturition: Secondary | ICD-10-CM | POA: Diagnosis not present

## 2019-03-08 DIAGNOSIS — E1169 Type 2 diabetes mellitus with other specified complication: Secondary | ICD-10-CM

## 2019-03-08 DIAGNOSIS — Z0000 Encounter for general adult medical examination without abnormal findings: Secondary | ICD-10-CM | POA: Diagnosis not present

## 2019-03-08 LAB — URINALYSIS, ROUTINE W REFLEX MICROSCOPIC
Bilirubin, UA: NEGATIVE
Ketones, UA: NEGATIVE
Leukocytes,UA: NEGATIVE
Nitrite, UA: NEGATIVE
Protein,UA: NEGATIVE
Specific Gravity, UA: 1.015 (ref 1.005–1.030)
Urobilinogen, Ur: 0.2 mg/dL (ref 0.2–1.0)
pH, UA: 5 (ref 5.0–7.5)

## 2019-03-08 LAB — MICROSCOPIC EXAMINATION
Bacteria, UA: NONE SEEN
WBC, UA: NONE SEEN /hpf (ref 0–5)

## 2019-03-08 LAB — BAYER DCA HB A1C WAIVED: HB A1C (BAYER DCA - WAIVED): 7.7 % — ABNORMAL HIGH (ref <7.0)

## 2019-03-08 MED ORDER — OZEMPIC (0.25 OR 0.5 MG/DOSE) 2 MG/1.5ML ~~LOC~~ SOPN: PEN_INJECTOR | SUBCUTANEOUS | 3 refills | Status: AC

## 2019-03-08 NOTE — Assessment & Plan Note (Signed)
Not under great control with A1c of 7.7- up from 6.7 with 3+ glucosuria. Will stop jardiance and start ozempic. Has only been taking 1000mg  of his metformin daily- increase back to 2000mg  daily. Recheck 1 month. Call with any concerns.

## 2019-03-08 NOTE — Patient Instructions (Signed)

## 2019-03-08 NOTE — Progress Notes (Signed)
BP 138/88    Pulse 79    Temp 98.5 F (36.9 C) (Oral)    Ht 6' (1.829 m)    Wt (!) 309 lb (140.2 kg)    SpO2 96%    BMI 41.91 kg/m    Subjective:    Patient ID: Cody Burke, male    DOB: 27-Jun-1965, 53 y.o.   MRN: [id number]  HPI: Cody Burke is a 53 y.o. male presenting on 03/08/2019 for comprehensive medical examination. Current medical complaints include:  DIABETES Hypoglycemic episodes:no Polydipsia/polyuria: yes Visual disturbance: no Chest pain: no Paresthesias: no Glucose Monitoring: yes  Accucheck frequency: Daily Taking Insulin?: no Blood Pressure Monitoring: not checking Retinal Examination: Not up to Date Foot Exam: Done today Diabetic Education: Completed Pneumovax: Up to Date Influenza: Done today Aspirin: yes  URINARY SYMPTOMS Duration: about a year Dysuria: no Urinary frequency: yes Urgency: yes Small volume voids: no Symptom severity: severe Urinary incontinence: yes Foul odor: no Hematuria: no Abdominal pain: no Back pain: no Suprapubic pain/pressure: no Flank pain: no Fever:  no Vomiting: no Relief with cranberry juice: no Relief with pyridium: no Status: stable Previous urinary tract infection: no Recurrent urinary tract infection: no  History of sexually transmitted disease: no Penile discharge: no Treatments attempted: none   Interim Problems from his last visit: no  Depression Screen done today and results listed below:  Depression screen [facility] 2/9 03/08/2019 01/05/2018 09/02/2017 06/03/2017 03/03/2017  Decreased Interest 1 0 0 0 0  Down, Depressed, Hopeless 1 0 0 0 0  PHQ - 2 Score 2 0 0 0 0  Altered sleeping 0 0 - - -  Tired, decreased energy 0 0 - - -  Change in appetite 0 0 - - -  Feeling bad or failure about yourself  0 0 - - -  Trouble concentrating 0 0 - - -  Moving slowly or fidgety/restless 0 0 - - -  Suicidal thoughts 0 0 - - -  PHQ-9 Score 2 0 - - -  Difficult doing work/chores Not difficult at all - - - -    Past  Medical History:  Past Medical History:  Diagnosis Date   Diabetes mellitus without complication ([place])    Hyperlipidemia    Hypertension    Peripheral vascular disease ([place])    Sleep apnea    severe - new DX - has not gotten CPAP yet    Surgical History:  Past Surgical History:  Procedure Laterality Date   COLONOSCOPY WITH PROPOFOL N/A 09/21/2015   Procedure: COLONOSCOPY WITH PROPOFOL;  Surgeon: Lucilla Lame, MD;  Location: [employer];  Service: Endoscopy;  Laterality: N/A;  Diabetic - oral meds sleep apnea   VASCULAR SURGERY Bilateral    legs    Medications:  Current Outpatient Medications on File Prior to Visit  Medication Sig   aspirin EC 81 MG tablet Take 81 mg by mouth daily.   atorvastatin (LIPITOR) 20 MG tablet Take 1 tablet (20 mg total) by mouth daily.   benazepril (LOTENSIN) 40 MG tablet Take 1 tablet (40 mg total) by mouth daily.   glucose blood test strip Please dispense insurance preference. Use to check BS BID   meloxicam (MOBIC) 15 MG tablet meloxicam 15 mg tablet  Take 1 tablet every day by oral route.   metFORMIN (GLUCOPHAGE) 500 MG tablet Take 2 tablets (1,000 mg total) by mouth 2 (two) times daily with a meal.   Multiple Vitamins-Minerals (MULTIVITAMIN GUMMIES ADULT PO) Take by mouth daily.  Omega-3 Fatty Acids (FISH OIL) 1000 MG CAPS Take 1 mg by mouth daily.   No current facility-administered medications on file prior to visit.     Allergies:  No Known Allergies  Social History:  Social History   Socioeconomic History   Marital status: Unknown    Spouse name: Not on file   Number of children: Not on file   Years of education: Not on file   Highest education level: Not on file  Occupational History   Not on file  Social Needs   Financial resource strain: Not on file   Food insecurity    Worry: Not on file    Inability: Not on file   Transportation needs    Medical: Not on file    Non-medical: Not on file    Tobacco Use   Smoking status: Former Smoker    Types: Cigarettes    Quit date: 07/30/2003    Years since quitting: 15.6   Smokeless tobacco: Never Used  Substance and Sexual Activity   Alcohol use: Yes    Comment: very little   Drug use: No   Sexual activity: Not on file  Lifestyle   Physical activity    Days per week: Not on file    Minutes per session: Not on file   Stress: Not on file  Relationships   Social connections    Talks on phone: Not on file    Gets together: Not on file    Attends religious service: Not on file    Active member of club or organization: Not on file    Attends meetings of clubs or organizations: Not on file    Relationship status: Not on file   Intimate partner violence    Fear of current or ex partner: Not on file    Emotionally abused: Not on file    Physically abused: Not on file    Forced sexual activity: Not on file  Other Topics Concern   Not on file  Social History Narrative   Not on file   Social History   Tobacco Use  Smoking Status Former Smoker   Types: Cigarettes   Quit date: 07/30/2003   Years since quitting: 15.6  Smokeless Tobacco Never Used   Social History   Substance and Sexual Activity  Alcohol Use Yes   Comment: very little    Family History:  Family History  Problem Relation Age of Onset   Cancer Mother    Hypertension Father     Past medical history, surgical history, medications, allergies, family history and social history reviewed with patient today and changes made to appropriate areas of the chart.   Review of Systems  Constitutional: Negative.   HENT: Negative.   Eyes: Negative.   Respiratory: Negative.   Cardiovascular: Negative.   Skin: Negative.     All other ROS negative except what is listed above and in the HPI.      Objective:    BP 138/88    Pulse 79    Temp 98.5 F (36.9 C) (Oral)    Ht 6' (1.829 m)    Wt (!) 309 lb (140.2 kg)    SpO2 96%    BMI 41.91 kg/m   Wt  Readings from Last 3 Encounters:  03/08/19 (!) 309 lb (140.2 kg)  02/14/19 (!) 310 lb (140.6 kg)  07/28/18 (!) 309 lb (140.2 kg)    Physical Exam Vitals signs and nursing note reviewed.  Constitutional:  General: He is not in acute distress.    Appearance: Normal appearance. He is obese. He is not ill-appearing, toxic-appearing or diaphoretic.  HENT:     Head: Normocephalic and atraumatic.     Right Ear: Tympanic membrane, ear canal and external ear normal. There is no impacted cerumen.     Left Ear: Tympanic membrane, ear canal and external ear normal. There is no impacted cerumen.     Nose: Nose normal. No congestion or rhinorrhea.     Mouth/Throat:     Mouth: Mucous membranes are moist.     Pharynx: Oropharynx is clear. No oropharyngeal exudate or posterior oropharyngeal erythema.  Eyes:     General: No scleral icterus.       Right eye: No discharge.        Left eye: No discharge.     Extraocular Movements: Extraocular movements intact.     Conjunctiva/sclera: Conjunctivae normal.     Pupils: Pupils are equal, round, and reactive to light.  Neck:     Musculoskeletal: Normal range of motion and neck supple. No neck rigidity or muscular tenderness.     Vascular: No carotid bruit.  Cardiovascular:     Rate and Rhythm: Normal rate and regular rhythm.     Pulses: Normal pulses.     Heart sounds: No murmur. No friction rub. No gallop.   Pulmonary:     Effort: Pulmonary effort is normal. No respiratory distress.     Breath sounds: Normal breath sounds. No stridor. No wheezing, rhonchi or rales.  Chest:     Chest wall: No tenderness.  Abdominal:     General: Abdomen is flat. Bowel sounds are normal. There is no distension.     Palpations: Abdomen is soft. There is no mass.     Tenderness: There is no abdominal tenderness. There is no right CVA tenderness, left CVA tenderness, guarding or rebound.     Hernia: No hernia is present.  Genitourinary:    Comments: Genital exam  deferred with shared decision making Musculoskeletal:        General: No swelling, tenderness, deformity or signs of injury.     Right lower leg: No edema.     Left lower leg: No edema.  Lymphadenopathy:     Cervical: No cervical adenopathy.  Skin:    General: Skin is warm and dry.     Capillary Refill: Capillary refill takes less than 2 seconds.     Coloration: Skin is not jaundiced or pale.     Findings: No bruising, erythema, lesion or rash.  Neurological:     General: No focal deficit present.     Mental Status: He is alert and oriented to person, place, and time.     Cranial Nerves: No cranial nerve deficit.     Sensory: No sensory deficit.     Motor: No weakness.     Coordination: Coordination normal.     Gait: Gait normal.     Deep Tendon Reflexes: Reflexes normal.  Psychiatric:        Mood and Affect: Mood normal.        Behavior: Behavior normal.        Thought Content: Thought content normal.        Judgment: Judgment normal.     Results for orders placed or performed in visit on 07/28/18  Bayer DCA Hb A1c Waived  Result Value Ref Range   HB A1C (BAYER DCA - WAIVED) 6.7 Q000111Q %  Basic metabolic  panel  Result Value Ref Range   Glucose 92 65 - 99 mg/dL   BUN 15 6 - 24 mg/dL   Creatinine, Ser 0.90 0.76 - 1.27 mg/dL   GFR calc non Af Amer 96 >59 mL/min/1.73   GFR calc Af Amer 112 >59 mL/min/1.73   BUN/Creatinine Ratio 17 9 - 20   Sodium 137 134 - 144 mmol/L   Potassium 4.1 3.5 - 5.2 mmol/L   Chloride 100 96 - 106 mmol/L   CO2 25 20 - 29 mmol/L   Calcium 9.2 8.7 - 10.2 mg/dL  LP+ALT+AST Piccolo, Waived  Result Value Ref Range   ALT (SGPT) Piccolo, Waived 42 10 - 47 U/L   AST (SGOT) Piccolo, Waived 43 (H) 11 - 38 U/L   Cholesterol Piccolo, Waived 168 <200 mg/dL   HDL Chol Piccolo, Waived 50 (L) >59 mg/dL   Triglycerides Piccolo,Waived 319 (H) <150 mg/dL   Chol/HDL Ratio Piccolo,Waive 3.3 mg/dL   LDL Chol Calc Piccolo Waived 54 <100 mg/dL   VLDL Chol Calc  Piccolo,Waive 64 (H) <30 mg/dL      Assessment & Plan:   Problem List Items Addressed This Visit      Endocrine   Diabetes mellitus associated with hormonal etiology ([place])    Not under great control with A1c of 7.7- up from 6.7 with 3+ glucosuria. Will stop jardiance and start ozempic. Has only been taking 1000mg  of his metformin daily- increase back to 2000mg  daily. Recheck 1 month. Call with any concerns.       Relevant Medications   Semaglutide,0.25 or 0.5MG /DOS, (OZEMPIC, 0.25 OR 0.5 MG/DOSE,) 2 MG/1.5ML SOPN    Other Visit Diagnoses    Routine general medical examination at a health care facility    -  Primary   Vaccines updated. Screening labs checked today. Colonoscopy up to date. Continue diet and exercise. Call with any concerns.    Urinary frequency       Likely due to jardiance and moderately controlled DM. Will stop jardiance and start ozempic. Recheck 1 month. Call with any concerns.    Encounter for screening for HIV       Labs drawn today. Await results.    Relevant Orders   HIV Antibody (routine testing w rflx)   PE (physical exam), annual       Labs drawn today. Await results.    Flu vaccine need       Flu shot given today.   Relevant Orders   Flu Vaccine QUAD 36+ mos IM (Completed)       Discussed aspirin prophylaxis for myocardial infarction prevention and decision was made to continue ASA  LABORATORY TESTING:  Health maintenance labs ordered today as discussed above.   The natural history of prostate cancer and ongoing controversy regarding screening and potential treatment outcomes of prostate cancer has been discussed with the patient. The meaning of a false positive PSA and a false negative PSA has been discussed. He indicates understanding of the limitations of this screening test and wishes to proceed with screening PSA testing.   IMMUNIZATIONS:   - Tdap: Tetanus vaccination status reviewed: last tetanus booster within 10 years. - Influenza:  Administered today - Pneumovax: Up to date - Prevnar: Not applicable  SCREENING: - Colonoscopy: Up to date  Discussed with patient purpose of the colonoscopy is to detect colon cancer at curable precancerous or early stages   PATIENT COUNSELING:    Sexuality: Discussed sexually transmitted diseases, partner selection, use of condoms, avoidance  of unintended pregnancy  and contraceptive alternatives.   Advised to avoid cigarette smoking.  I discussed with the patient that most people either abstain from alcohol or drink within safe limits (<=14/week and <=4 drinks/occasion for males, <=7/weeks and <= 3 drinks/occasion for females) and that the risk for alcohol disorders and other health effects rises proportionally with the number of drinks per week and how often a drinker exceeds daily limits.  Discussed cessation/primary prevention of drug use and availability of treatment for abuse.   Diet: Encouraged to adjust caloric intake to maintain  or achieve ideal body weight, to reduce intake of dietary saturated fat and total fat, to limit sodium intake by avoiding high sodium foods and not adding table salt, and to maintain adequate dietary potassium and calcium preferably from fresh fruits, vegetables, and low-fat dairy products.    stressed the importance of regular exercise  Injury prevention: Discussed safety belts, safety helmets, smoke detector, smoking near bedding or upholstery.   Dental health: Discussed importance of regular tooth brushing, flossing, and dental visits.   Follow up plan: NEXT PREVENTATIVE PHYSICAL DUE IN 1 YEAR. Return in about 4 weeks (around 04/05/2019) for follow up urinary symptoms and sugars.

## 2019-03-09 LAB — COMPREHENSIVE METABOLIC PANEL
ALT: 45 IU/L — ABNORMAL HIGH (ref 0–44)
AST: 33 IU/L (ref 0–40)
Albumin/Globulin Ratio: 1.4 (ref 1.2–2.2)
Albumin: 4 g/dL (ref 3.8–4.9)
Alkaline Phosphatase: 104 IU/L (ref 39–117)
BUN/Creatinine Ratio: 14 (ref 9–20)
BUN: 13 mg/dL (ref 6–24)
Bilirubin Total: 0.4 mg/dL (ref 0.0–1.2)
CO2: 22 mmol/L (ref 20–29)
Calcium: 9.2 mg/dL (ref 8.7–10.2)
Chloride: 100 mmol/L (ref 96–106)
Creatinine, Ser: 0.9 mg/dL (ref 0.76–1.27)
GFR calc Af Amer: 112 mL/min/{1.73_m2} (ref >59)
GFR calc non Af Amer: 96 mL/min/{1.73_m2} (ref >59)
Globulin, Total: 2.9 g/dL (ref 1.5–4.5)
Glucose: 165 mg/dL — ABNORMAL HIGH (ref 65–99)
Potassium: 4.8 mmol/L (ref 3.5–5.2)
Sodium: 138 mmol/L (ref 134–144)
Total Protein: 6.9 g/dL (ref 6.0–8.5)

## 2019-03-09 LAB — CBC WITH DIFFERENTIAL/PLATELET
Basophils Absolute: 0.1 10*3/uL (ref 0.0–0.2)
Basos: 1 %
EOS (ABSOLUTE): 0.3 10*3/uL (ref 0.0–0.4)
Eos: 3 %
Hematocrit: 51.8 % — ABNORMAL HIGH (ref 37.5–51.0)
Hemoglobin: 17.4 g/dL (ref 13.0–17.7)
Immature Grans (Abs): 0.1 10*3/uL (ref 0.0–0.1)
Immature Granulocytes: 1 %
Lymphocytes Absolute: 4.3 10*3/uL — ABNORMAL HIGH (ref 0.7–3.1)
Lymphs: 46 %
MCH: 29.9 pg (ref 26.6–33.0)
MCHC: 33.6 g/dL (ref 31.5–35.7)
MCV: 89 fL (ref 79–97)
Monocytes Absolute: 1 10*3/uL — ABNORMAL HIGH (ref 0.1–0.9)
Monocytes: 11 %
Neutrophils Absolute: 3.5 10*3/uL (ref 1.4–7.0)
Neutrophils: 38 %
Platelets: 234 10*3/uL (ref 150–450)
RBC: 5.82 x10E6/uL — ABNORMAL HIGH (ref 4.14–5.80)
RDW: 13.2 % (ref 11.6–15.4)
WBC: 9.2 10*3/uL (ref 3.4–10.8)

## 2019-03-09 LAB — LIPID PANEL
Chol/HDL Ratio: 3.2 ratio (ref 0.0–5.0)
Cholesterol, Total: 140 mg/dL (ref 100–199)
HDL: 44 mg/dL (ref >39)
LDL Chol Calc (NIH): 69 mg/dL (ref 0–99)
Triglycerides: 154 mg/dL — ABNORMAL HIGH (ref 0–149)
VLDL Cholesterol Cal: 27 mg/dL (ref 5–40)

## 2019-03-09 LAB — PSA: Prostate Specific Ag, Serum: <0.1 ng/mL (ref 0.0–4.0)

## 2019-03-09 LAB — HIV ANTIBODY (ROUTINE TESTING W REFLEX): HIV Screen 4th Generation wRfx: NONREACTIVE

## 2019-03-09 LAB — TSH: TSH: 1.98 u[IU]/mL (ref 0.450–4.500)

## 2019-04-08 ENCOUNTER — Ambulatory Visit (INDEPENDENT_AMBULATORY_CARE_PROVIDER_SITE_OTHER): Payer: No Typology Code available for payment source | Admitting: Family Medicine

## 2019-04-08 ENCOUNTER — Encounter: Payer: Self-pay | Admitting: Family Medicine

## 2019-04-08 ENCOUNTER — Other Ambulatory Visit: Payer: Self-pay

## 2019-04-08 DIAGNOSIS — E1169 Type 2 diabetes mellitus with other specified complication: Secondary | ICD-10-CM

## 2019-04-08 MED ORDER — PEN NEEDLES 31G X 8 MM MISC: 1.0000 | 1 refills | Status: DC

## 2019-04-08 NOTE — Assessment & Plan Note (Signed)
Urine much better. Tolerating ozempic well. Will increase his ozempic to 0.5mg  weekly and recheck 2 months. Call with any concerns. Continue to monitor.

## 2019-04-08 NOTE — Progress Notes (Signed)
There were no vitals taken for this visit.   Subjective:    Patient ID: Cody Burke, male    DOB: Jul 24, 1965, 53 y.o.   MRN: [id number]  HPI: Cody Burke is a 53 y.o. male  Chief Complaint  Patient presents with  . Diabetes    check on Ozempic    DIABETES Hypoglycemic episodes:no Polydipsia/polyuria: no Visual disturbance: no Chest pain: no Paresthesias: no Glucose Monitoring: yes  Accucheck frequency: Daily  Fasting glucose: 116-254 Taking Insulin?: no Blood Pressure Monitoring: not checking Retinal Examination: Not up to Date Foot Exam: Up to Date Diabetic Education: Completed Pneumovax: Up to Date Influenza: Up to Date Aspirin: yes  Relevant past medical, surgical, family and social history reviewed and updated as indicated. Interim medical history since our last visit reviewed. Allergies and medications reviewed and updated.  Review of Systems  Constitutional: Negative.   Respiratory: Negative.   Cardiovascular: Negative.   Genitourinary: Negative.   Musculoskeletal: Negative.   Psychiatric/Behavioral: Negative.     Per HPI unless specifically indicated above     Objective:    There were no vitals taken for this visit.  Wt Readings from Last 3 Encounters:  03/08/19 (!) 309 lb (140.2 kg)  02/14/19 (!) 310 lb (140.6 kg)  07/28/18 (!) 309 lb (140.2 kg)    Physical Exam Vitals signs and nursing note reviewed.  Constitutional:      General: He is not in acute distress.    Appearance: Normal appearance. He is not ill-appearing, toxic-appearing or diaphoretic.  HENT:     Head: Normocephalic and atraumatic.     Right Ear: External ear normal.     Left Ear: External ear normal.     Nose: Nose normal.     Mouth/Throat:     Mouth: Mucous membranes are moist.     Pharynx: Oropharynx is clear.  Eyes:     General: No scleral icterus.       Right eye: No discharge.        Left eye: No discharge.     Conjunctiva/sclera: Conjunctivae normal.     Pupils:  Pupils are equal, round, and reactive to light.  Neck:     Musculoskeletal: Normal range of motion.  Pulmonary:     Effort: Pulmonary effort is normal. No respiratory distress.     Comments: Speaking in full sentences Musculoskeletal: Normal range of motion.  Skin:    Coloration: Skin is not jaundiced or pale.     Findings: No bruising, erythema, lesion or rash.  Neurological:     Mental Status: He is alert and oriented to person, place, and time. Mental status is at baseline.  Psychiatric:        Mood and Affect: Mood normal.        Behavior: Behavior normal.        Thought Content: Thought content normal.        Judgment: Judgment normal.     Results for orders placed or performed in visit on 03/08/19  Microscopic Examination   URINE  Result Value Ref Range   WBC, UA None seen 0 - 5 /hpf   RBC 0-2 0 - 2 /hpf   Epithelial Cells (non renal) 0-10 0 - 10 /hpf   Bacteria, UA None seen None seen/Few  HIV Antibody (routine testing w rflx)  Result Value Ref Range   HIV Screen 4th Generation wRfx Non Reactive Non Reactive  PSA  Result Value Ref Range   Prostate Specific Ag, Serum <  0.1 0.0 - 4.0 ng/mL  Urinalysis, Routine w reflex microscopic  Result Value Ref Range   Specific Gravity, UA 1.015 1.005 - 1.030   pH, UA 5.0 5.0 - 7.5   Color, UA Yellow Yellow   Appearance Ur Clear Clear   Leukocytes,UA Negative Negative   Protein,UA Negative Negative/Trace   Glucose, UA 3+ (A) Negative   Ketones, UA Negative Negative   RBC, UA Trace (A) Negative   Bilirubin, UA Negative Negative   Urobilinogen, Ur 0.2 0.2 - 1.0 mg/dL   Nitrite, UA Negative Negative   Microscopic Examination See below:   TSH  Result Value Ref Range   TSH 1.980 0.450 - 4.500 uIU/mL  CBC with Differential/Platelet  Result Value Ref Range   WBC 9.2 3.4 - 10.8 x10E3/uL   RBC 5.82 (H) 4.14 - 5.80 x10E6/uL   Hemoglobin 17.4 13.0 - 17.7 g/dL   Hematocrit 51.8 (H) 37.5 - 51.0 %   MCV 89 79 - 97 fL   MCH 29.9  26.6 - 33.0 pg   MCHC 33.6 31.5 - 35.7 g/dL   RDW 13.2 11.6 - 15.4 %   Platelets 234 150 - 450 x10E3/uL   Neutrophils 38 Not Estab. %   Lymphs 46 Not Estab. %   Monocytes 11 Not Estab. %   Eos 3 Not Estab. %   Basos 1 Not Estab. %   Neutrophils Absolute 3.5 1.4 - 7.0 x10E3/uL   Lymphocytes Absolute 4.3 (H) 0.7 - 3.1 x10E3/uL   Monocytes Absolute 1.0 (H) 0.1 - 0.9 x10E3/uL   EOS (ABSOLUTE) 0.3 0.0 - 0.4 x10E3/uL   Basophils Absolute 0.1 0.0 - 0.2 x10E3/uL   Immature Granulocytes 1 Not Estab. %   Immature Grans (Abs) 0.1 0.0 - 0.1 x10E3/uL  Lipid panel  Result Value Ref Range   Cholesterol, Total 140 100 - 199 mg/dL   Triglycerides 154 (H) 0 - 149 mg/dL   HDL 44 >39 mg/dL   VLDL Cholesterol Cal 27 5 - 40 mg/dL   LDL Chol Calc (NIH) 69 0 - 99 mg/dL   Chol/HDL Ratio 3.2 0.0 - 5.0 ratio  Comprehensive metabolic panel  Result Value Ref Range   Glucose 165 (H) 65 - 99 mg/dL   BUN 13 6 - 24 mg/dL   Creatinine, Ser 0.90 0.76 - 1.27 mg/dL   GFR calc non Af Amer 96 >59 mL/min/1.73   GFR calc Af Amer 112 >59 mL/min/1.73   BUN/Creatinine Ratio 14 9 - 20   Sodium 138 134 - 144 mmol/L   Potassium 4.8 3.5 - 5.2 mmol/L   Chloride 100 96 - 106 mmol/L   CO2 22 20 - 29 mmol/L   Calcium 9.2 8.7 - 10.2 mg/dL   Total Protein 6.9 6.0 - 8.5 g/dL   Albumin 4.0 3.8 - 4.9 g/dL   Globulin, Total 2.9 1.5 - 4.5 g/dL   Albumin/Globulin Ratio 1.4 1.2 - 2.2   Bilirubin Total 0.4 0.0 - 1.2 mg/dL   Alkaline Phosphatase 104 39 - 117 IU/L   AST 33 0 - 40 IU/L   ALT 45 (H) 0 - 44 IU/L  Bayer DCA Hb A1c Waived  Result Value Ref Range   HB A1C (BAYER DCA - WAIVED) 7.7 (H) <7.0 %      Assessment & Plan:   Problem List Items Addressed This Visit      Endocrine   Diabetes mellitus associated with hormonal etiology ([place])    Urine much better. Tolerating ozempic well. Will increase  his ozempic to 0.5mg  weekly and recheck 2 months. Call with any concerns. Continue to monitor.           Follow up plan:  Return in about 2 months (around 06/08/2019).    . This visit was completed via FaceTime due to the restrictions of the COVID-19 pandemic. All issues as above were discussed and addressed. Physical exam was done as above through visual confirmation on FaceTime. If it was felt that the patient should be evaluated in the office, they were directed there. The patient verbally consented to this visit. . Location of the patient: home . Location of the provider: home . Those involved with this call:  . Provider: Park Liter, DO . CMA: Tiffany Reel, CMA . Front Desk/Registration: Don Perking  . Time spent on call: 15 minutes with patient face to face via video conference. More than 50% of this time was spent in counseling and coordination of care. 23 minutes total spent in review of patient's record and preparation of their chart.

## 2019-05-04 ENCOUNTER — Encounter: Payer: Self-pay | Admitting: Family Medicine

## 2019-05-24 LAB — HM DIABETES EYE EXAM

## 2019-06-10 ENCOUNTER — Ambulatory Visit: Payer: No Typology Code available for payment source | Admitting: Family Medicine

## 2019-06-10 ENCOUNTER — Encounter: Payer: Self-pay | Admitting: Family Medicine

## 2019-06-10 ENCOUNTER — Other Ambulatory Visit: Payer: Self-pay

## 2019-06-10 VITALS — BP 147/81 | HR 75 | Temp 97.7°F

## 2019-06-10 DIAGNOSIS — E1169 Type 2 diabetes mellitus with other specified complication: Secondary | ICD-10-CM

## 2019-06-10 LAB — BAYER DCA HB A1C WAIVED: HB A1C (BAYER DCA - WAIVED): 6.9 % (ref <7.0)

## 2019-06-10 MED ORDER — OZEMPIC (1 MG/DOSE) 2 MG/1.5ML ~~LOC~~ SOPN: 1.0000 mg | PEN_INJECTOR | SUBCUTANEOUS | 3 refills | Status: DC

## 2019-06-10 NOTE — Progress Notes (Signed)
BP (!) 147/81 (BP Location: Left Arm, Patient Position: Sitting, Cuff Size: Normal)   Pulse 75   Temp 97.7 F (36.5 C) (Oral)   SpO2 100%    Subjective:    Patient ID: Cody Burke, male    DOB: 09/14/1965, 54 y.o.   MRN: [id number]  HPI: Cody Burke is a 54 y.o. male  Chief Complaint  Patient presents with  . Diabetes    follow up to check A1C.   DIABETES Hypoglycemic episodes:no Polydipsia/polyuria: no Visual disturbance: no Chest pain: no Paresthesias: no Glucose Monitoring: yes  Accucheck frequency: Daily  Fasting glucose: 120-180 Taking Insulin?: no Blood Pressure Monitoring: not checking Retinal Examination: Up to Date Foot Exam: Up to Date Diabetic Education: Completed Pneumovax: Up to Date Influenza: Up to Date Aspirin: yes  Relevant past medical, surgical, family and social history reviewed and updated as indicated. Interim medical history since our last visit reviewed. Allergies and medications reviewed and updated.  Review of Systems  Constitutional: Negative.   Respiratory: Negative.   Cardiovascular: Negative.   Musculoskeletal: Negative.   Psychiatric/Behavioral: Negative.     Per HPI unless specifically indicated above     Objective:    BP (!) 147/81 (BP Location: Left Arm, Patient Position: Sitting, Cuff Size: Normal)   Pulse 75   Temp 97.7 F (36.5 C) (Oral)   SpO2 100%   Wt Readings from Last 3 Encounters:  03/08/19 (!) 309 lb (140.2 kg)  02/14/19 (!) 310 lb (140.6 kg)  07/28/18 (!) 309 lb (140.2 kg)    Physical Exam Vitals and nursing note reviewed.  Constitutional:      General: He is not in acute distress.    Appearance: Normal appearance. He is not ill-appearing, toxic-appearing or diaphoretic.  HENT:     Head: Normocephalic and atraumatic.     Right Ear: External ear normal.     Left Ear: External ear normal.     Nose: Nose normal.     Mouth/Throat:     Mouth: Mucous membranes are moist.     Pharynx: Oropharynx is clear.   Eyes:     General: No scleral icterus.       Right eye: No discharge.        Left eye: No discharge.     Extraocular Movements: Extraocular movements intact.     Conjunctiva/sclera: Conjunctivae normal.     Pupils: Pupils are equal, round, and reactive to light.  Cardiovascular:     Rate and Rhythm: Normal rate and regular rhythm.     Pulses: Normal pulses.     Heart sounds: Normal heart sounds. No murmur. No friction rub. No gallop.   Pulmonary:     Effort: Pulmonary effort is normal. No respiratory distress.     Breath sounds: Normal breath sounds. No stridor. No wheezing, rhonchi or rales.  Chest:     Chest wall: No tenderness.  Musculoskeletal:        General: Normal range of motion.     Cervical back: Normal range of motion and neck supple.  Skin:    General: Skin is warm and dry.     Capillary Refill: Capillary refill takes less than 2 seconds.     Coloration: Skin is not jaundiced or pale.     Findings: No bruising, erythema, lesion or rash.  Neurological:     General: No focal deficit present.     Mental Status: He is alert and oriented to person, place, and time. Mental status is  at baseline.  Psychiatric:        Mood and Affect: Mood normal.        Behavior: Behavior normal.        Thought Content: Thought content normal.        Judgment: Judgment normal.     Results for orders placed or performed in visit on 05/26/19  HM DIABETES EYE EXAM  Result Value Ref Range   HM Diabetic Eye Exam No Retinopathy No Retinopathy      Assessment & Plan:   Problem List Items Addressed This Visit      Endocrine   Diabetes mellitus associated with hormonal etiology ([place]) - Primary    Sugars better still running high with A1c of 6.9. Will increase his ozempic to 1mg  and recheck 3 months. Call with any concerns.       Relevant Medications   Semaglutide, 1 MG/DOSE, (OZEMPIC, 1 MG/DOSE,) 2 MG/1.5ML SOPN   Other Relevant Orders   Bayer DCA Hb A1c Waived       Follow up  plan: Return in about 3 months (around 09/08/2019) for 6 month follow up.

## 2019-06-10 NOTE — Assessment & Plan Note (Signed)
Sugars better still running high with A1c of 6.9. Will increase his ozempic to 1mg  and recheck 3 months. Call with any concerns.

## 2019-09-08 NOTE — Progress Notes (Signed)
BP (!) 156/80 (BP Location: Left Arm, Cuff Size: Normal)   Pulse 65   Temp 97.6 F (36.4 C) (Oral)   Wt (!) 313 lb 9.6 oz (142.2 kg)   SpO2 100%   BMI 42.53 kg/m    Subjective:    Patient ID: Cody Burke, male    DOB: 08-12-1965, 54 y.o.   MRN: [id number]  HPI: Cody Burke is a 54 y.o. male  Chief Complaint  Patient presents with  . Diabetes   HYPERTENSION / HYPERLIPIDEMIA Satisfied with current treatment? yes Duration of hypertension: chronic BP monitoring frequency: not checking BP medication side effects: no Past BP meds: benazepril Duration of hyperlipidemia: chronic Cholesterol medication side effects: no Cholesterol supplements: fish oil Past cholesterol medications: atorvastatin Medication compliance: excellent compliance Aspirin: yes Recent stressors: no Recurrent headaches: no Visual changes: no Palpitations: no Dyspnea: no Chest pain: no Lower extremity edema: no Dizzy/lightheaded: no  DIABETES Hypoglycemic episodes:no Polydipsia/polyuria: no Visual disturbance: no Chest pain: no Paresthesias: no Glucose Monitoring: yes  Accucheck frequency: rarely Taking Insulin?: no Blood Pressure Monitoring: not checking Retinal Examination: Up to Date Foot Exam: Up to Date Diabetic Education: Completed Pneumovax: Up to Date Influenza: Up to Date Aspirin: yes  Relevant past medical, surgical, family and social history reviewed and updated as indicated. Interim medical history since our last visit reviewed. Allergies and medications reviewed and updated.  Review of Systems  Constitutional: Negative.   Respiratory: Negative.   Cardiovascular: Negative.   Gastrointestinal: Negative.   Musculoskeletal: Positive for arthralgias. Negative for back pain, gait problem, joint swelling, myalgias, neck pain and neck stiffness.  Neurological: Negative.   Psychiatric/Behavioral: Negative.     Per HPI unless specifically indicated above     Objective:    BP  (!) 156/80 (BP Location: Left Arm, Cuff Size: Normal)   Pulse 65   Temp 97.6 F (36.4 C) (Oral)   Wt (!) 313 lb 9.6 oz (142.2 kg)   SpO2 100%   BMI 42.53 kg/m   Wt Readings from Last 3 Encounters:  09/09/19 (!) 313 lb 9.6 oz (142.2 kg)  03/08/19 (!) 309 lb (140.2 kg)  02/14/19 (!) 310 lb (140.6 kg)    Physical Exam Vitals and nursing note reviewed.  Constitutional:      General: He is not in acute distress.    Appearance: Normal appearance. He is not ill-appearing, toxic-appearing or diaphoretic.  HENT:     Head: Normocephalic and atraumatic.     Right Ear: External ear normal.     Left Ear: External ear normal.     Nose: Nose normal.     Mouth/Throat:     Mouth: Mucous membranes are moist.     Pharynx: Oropharynx is clear.  Eyes:     General: No scleral icterus.       Right eye: No discharge.        Left eye: No discharge.     Extraocular Movements: Extraocular movements intact.     Conjunctiva/sclera: Conjunctivae normal.     Pupils: Pupils are equal, round, and reactive to light.  Cardiovascular:     Rate and Rhythm: Normal rate and regular rhythm.     Pulses: Normal pulses.     Heart sounds: Normal heart sounds. No murmur. No friction rub. No gallop.   Pulmonary:     Effort: Pulmonary effort is normal. No respiratory distress.     Breath sounds: Normal breath sounds. No stridor. No wheezing, rhonchi or rales.  Chest:  Chest wall: No tenderness.  Musculoskeletal:        General: Normal range of motion.     Cervical back: Normal range of motion and neck supple.  Skin:    General: Skin is warm and dry.     Capillary Refill: Capillary refill takes less than 2 seconds.     Coloration: Skin is not jaundiced or pale.     Findings: No bruising, erythema, lesion or rash.  Neurological:     General: No focal deficit present.     Mental Status: He is alert and oriented to person, place, and time. Mental status is at baseline.  Psychiatric:        Mood and Affect:  Mood normal.        Behavior: Behavior normal.        Thought Content: Thought content normal.        Judgment: Judgment normal.     Results for orders placed or performed in visit on 06/10/19  Bayer DCA Hb A1c Waived  Result Value Ref Range   HB A1C (BAYER DCA - WAIVED) 6.9 <7.0 %      Assessment & Plan:   Problem List Items Addressed This Visit      Cardiovascular and Mediastinum   Hypertension - Primary    Running high today. Will work on [employer] and recheck 3 months. Will monitor at home and call if running high. Call with any concerns. Refills up to date.       Relevant Orders   CBC with Differential/Platelet   Comprehensive metabolic panel   Microalbumin, Urine Waived     Endocrine   Diabetes mellitus associated with hormonal etiology ([place])    Doing great with a1c of 7.0. Continue current regimen. Continue to monitor. Call with any concerns.       Relevant Medications   Semaglutide, 1 MG/DOSE, (OZEMPIC, 1 MG/DOSE,) 2 MG/1.5ML SOPN   Other Relevant Orders   Bayer DCA Hb A1c Waived   CBC with Differential/Platelet   Comprehensive metabolic panel   Microalbumin, Urine Waived     Other   Hyperlipidemia    Under good control on current regimen. Continue current regimen. Continue to monitor. Call with any concerns. Refills up to date.        Relevant Orders   CBC with Differential/Platelet   Comprehensive metabolic panel   Lipid Panel w/o Chol/HDL Ratio    Other Visit Diagnoses    Chronic right shoulder pain       Would like to see ortho. Referral generated today.   Relevant Orders   Ambulatory referral to Orthopedic Surgery       Follow up plan: Return in about 3 months (around 12/09/2019).

## 2019-09-09 ENCOUNTER — Other Ambulatory Visit: Payer: Self-pay

## 2019-09-09 ENCOUNTER — Encounter: Payer: Self-pay | Admitting: Family Medicine

## 2019-09-09 ENCOUNTER — Ambulatory Visit (INDEPENDENT_AMBULATORY_CARE_PROVIDER_SITE_OTHER): Payer: No Typology Code available for payment source | Admitting: Family Medicine

## 2019-09-09 VITALS — BP 156/80 | HR 65 | Temp 97.6°F | Wt 313.6 lb

## 2019-09-09 DIAGNOSIS — E1169 Type 2 diabetes mellitus with other specified complication: Secondary | ICD-10-CM | POA: Diagnosis not present

## 2019-09-09 DIAGNOSIS — M25511 Pain in right shoulder: Secondary | ICD-10-CM

## 2019-09-09 DIAGNOSIS — E7800 Pure hypercholesterolemia, unspecified: Secondary | ICD-10-CM

## 2019-09-09 DIAGNOSIS — I10 Essential (primary) hypertension: Secondary | ICD-10-CM

## 2019-09-09 DIAGNOSIS — G8929 Other chronic pain: Secondary | ICD-10-CM

## 2019-09-09 LAB — MICROALBUMIN, URINE WAIVED
Creatinine, Urine Waived: 100 mg/dL (ref 10–300)
Microalb, Ur Waived: 10 mg/L (ref 0–19)
Microalb/Creat Ratio: <30 mg/g (ref <30)

## 2019-09-09 LAB — BAYER DCA HB A1C WAIVED: HB A1C (BAYER DCA - WAIVED): 7 % — ABNORMAL HIGH (ref <7.0)

## 2019-09-09 MED ORDER — OZEMPIC (1 MG/DOSE) 2 MG/1.5ML ~~LOC~~ SOPN: 1.0000 mg | PEN_INJECTOR | SUBCUTANEOUS | 6 refills | Status: DC

## 2019-09-09 NOTE — Assessment & Plan Note (Signed)
Running high today. Will work on [employer] and recheck 3 months. Will monitor at home and call if running high. Call with any concerns. Refills up to date.

## 2019-09-09 NOTE — Assessment & Plan Note (Signed)
Doing great with a1c of 7.0. Continue current regimen. Continue to monitor. Call with any concerns.

## 2019-09-09 NOTE — Assessment & Plan Note (Signed)
Under good control on current regimen. Continue current regimen. Continue to monitor. Call with any concerns. Refills up to date.   

## 2019-09-10 LAB — COMPREHENSIVE METABOLIC PANEL
ALT: 42 IU/L (ref 0–44)
AST: 29 IU/L (ref 0–40)
Albumin/Globulin Ratio: 1.7 (ref 1.2–2.2)
Albumin: 4.3 g/dL (ref 3.8–4.9)
Alkaline Phosphatase: 101 IU/L (ref 39–117)
BUN/Creatinine Ratio: 12 (ref 9–20)
BUN: 12 mg/dL (ref 6–24)
Bilirubin Total: 0.4 mg/dL (ref 0.0–1.2)
CO2: 25 mmol/L (ref 20–29)
Calcium: 9.4 mg/dL (ref 8.7–10.2)
Chloride: 100 mmol/L (ref 96–106)
Creatinine, Ser: 0.97 mg/dL (ref 0.76–1.27)
GFR calc Af Amer: 101 mL/min/{1.73_m2} (ref >59)
GFR calc non Af Amer: 88 mL/min/{1.73_m2} (ref >59)
Globulin, Total: 2.5 g/dL (ref 1.5–4.5)
Glucose: 184 mg/dL — ABNORMAL HIGH (ref 65–99)
Potassium: 4.9 mmol/L (ref 3.5–5.2)
Sodium: 139 mmol/L (ref 134–144)
Total Protein: 6.8 g/dL (ref 6.0–8.5)

## 2019-09-10 LAB — CBC WITH DIFFERENTIAL/PLATELET
Basophils Absolute: 0.1 10*3/uL (ref 0.0–0.2)
Basos: 1 %
EOS (ABSOLUTE): 0.3 10*3/uL (ref 0.0–0.4)
Eos: 4 %
Hematocrit: 49.2 % (ref 37.5–51.0)
Hemoglobin: 16.7 g/dL (ref 13.0–17.7)
Immature Grans (Abs): 0.1 10*3/uL (ref 0.0–0.1)
Immature Granulocytes: 1 %
Lymphocytes Absolute: 4 10*3/uL — ABNORMAL HIGH (ref 0.7–3.1)
Lymphs: 43 %
MCH: 30.3 pg (ref 26.6–33.0)
MCHC: 33.9 g/dL (ref 31.5–35.7)
MCV: 89 fL (ref 79–97)
Monocytes Absolute: 0.7 10*3/uL (ref 0.1–0.9)
Monocytes: 8 %
Neutrophils Absolute: 3.9 10*3/uL (ref 1.4–7.0)
Neutrophils: 43 %
Platelets: 236 10*3/uL (ref 150–450)
RBC: 5.52 x10E6/uL (ref 4.14–5.80)
RDW: 12.9 % (ref 11.6–15.4)
WBC: 9.2 10*3/uL (ref 3.4–10.8)

## 2019-09-10 LAB — LIPID PANEL W/O CHOL/HDL RATIO
Cholesterol, Total: 121 mg/dL (ref 100–199)
HDL: 42 mg/dL (ref >39)
LDL Chol Calc (NIH): 53 mg/dL (ref 0–99)
Triglycerides: 150 mg/dL — ABNORMAL HIGH (ref 0–149)
VLDL Cholesterol Cal: 26 mg/dL (ref 5–40)

## 2019-10-11 ENCOUNTER — Other Ambulatory Visit: Payer: Self-pay | Admitting: Sports Medicine

## 2019-10-11 DIAGNOSIS — M75121 Complete rotator cuff tear or rupture of right shoulder, not specified as traumatic: Secondary | ICD-10-CM

## 2019-10-11 DIAGNOSIS — M25511 Pain in right shoulder: Secondary | ICD-10-CM

## 2019-10-11 DIAGNOSIS — M7541 Impingement syndrome of right shoulder: Secondary | ICD-10-CM

## 2019-10-11 DIAGNOSIS — G8929 Other chronic pain: Secondary | ICD-10-CM

## 2019-10-13 ENCOUNTER — Ambulatory Visit
Admission: RE | Admit: 2019-10-13 | Discharge: 2019-10-13 | Disposition: A | Discharge status: Home / Self Care | Payer: PRIVATE HEALTH INSURANCE | Source: Ambulatory Visit | Attending: Sports Medicine | Admitting: Sports Medicine

## 2019-10-13 ENCOUNTER — Other Ambulatory Visit: Payer: Self-pay

## 2019-10-13 DIAGNOSIS — M25511 Pain in right shoulder: Secondary | ICD-10-CM | POA: Insufficient documentation

## 2019-10-13 DIAGNOSIS — M75121 Complete rotator cuff tear or rupture of right shoulder, not specified as traumatic: Secondary | ICD-10-CM | POA: Insufficient documentation

## 2019-10-13 DIAGNOSIS — G8929 Other chronic pain: Secondary | ICD-10-CM | POA: Insufficient documentation

## 2019-10-13 DIAGNOSIS — M7541 Impingement syndrome of right shoulder: Secondary | ICD-10-CM

## 2019-11-09 ENCOUNTER — Other Ambulatory Visit: Payer: Self-pay

## 2019-11-09 DIAGNOSIS — E119 Type 2 diabetes mellitus without complications: Secondary | ICD-10-CM

## 2019-11-09 MED ORDER — GLUCOSE BLOOD VI STRP: ORAL_STRIP | 4 refills | Status: AC

## 2019-11-09 NOTE — Telephone Encounter (Signed)
Patient last seen 09/09/19 and has appointment 12/19/19

## 2019-12-16 ENCOUNTER — Ambulatory Visit: Payer: No Typology Code available for payment source | Admitting: Family Medicine

## 2019-12-19 ENCOUNTER — Ambulatory Visit (INDEPENDENT_AMBULATORY_CARE_PROVIDER_SITE_OTHER): Payer: No Typology Code available for payment source | Admitting: Family Medicine

## 2019-12-19 ENCOUNTER — Encounter: Payer: Self-pay | Admitting: Family Medicine

## 2019-12-19 ENCOUNTER — Other Ambulatory Visit: Payer: Self-pay

## 2019-12-19 VITALS — BP 134/84 | HR 92 | Temp 98.6°F | Wt 311.8 lb

## 2019-12-19 DIAGNOSIS — E7800 Pure hypercholesterolemia, unspecified: Secondary | ICD-10-CM

## 2019-12-19 DIAGNOSIS — I10 Essential (primary) hypertension: Secondary | ICD-10-CM | POA: Diagnosis not present

## 2019-12-19 DIAGNOSIS — E1169 Type 2 diabetes mellitus with other specified complication: Secondary | ICD-10-CM

## 2019-12-19 LAB — BAYER DCA HB A1C WAIVED: HB A1C (BAYER DCA - WAIVED): 6.4 % (ref <7.0)

## 2019-12-19 LAB — MICROALBUMIN, URINE WAIVED
Creatinine, Urine Waived: 200 mg/dL (ref 10–300)
Microalb, Ur Waived: 10 mg/L (ref 0–19)
Microalb/Creat Ratio: <30 mg/g (ref <30)

## 2019-12-19 MED ORDER — BENAZEPRIL HCL 40 MG PO TABS: 40.0000 mg | ORAL_TABLET | Freq: Every day | ORAL | 1 refills | Status: DC

## 2019-12-19 MED ORDER — OZEMPIC (1 MG/DOSE) 2 MG/1.5ML ~~LOC~~ SOPN: 1.0000 mg | PEN_INJECTOR | SUBCUTANEOUS | 6 refills | Status: DC

## 2019-12-19 MED ORDER — ATORVASTATIN CALCIUM 20 MG PO TABS: 20.0000 mg | ORAL_TABLET | Freq: Every day | ORAL | 1 refills | Status: DC

## 2019-12-19 MED ORDER — METFORMIN HCL 500 MG PO TABS: 1000.0000 mg | ORAL_TABLET | Freq: Two times a day (BID) | ORAL | 1 refills | Status: DC

## 2019-12-19 NOTE — Assessment & Plan Note (Signed)
Under good control on current regimen. Continue current regimen. Continue to monitor. Call with any concerns. Refills given. Labs drawn today.   

## 2019-12-19 NOTE — Progress Notes (Signed)
BP (!) 134/84 (BP Location: Left Arm, Patient Position: Sitting, Cuff Size: Large)   Pulse 92   Temp 98.6 F (37 C) (Oral)   Wt (!) 311 lb 12.8 oz (141.4 kg)   SpO2 97%   BMI 42.29 kg/m    Subjective:    Patient ID: Cody Burke, male    DOB: 01/27/1966, 54 y.o.   MRN: [id number]  HPI: Cody Burke is a 54 y.o. male  Chief Complaint  Patient presents with  . Hyperlipidemia  . Hypertension  . Diabetes   HYPERTENSION / HYPERLIPIDEMIA Satisfied with current treatment? yes Duration of hypertension: chronic BP monitoring frequency: not checking BP medication side effects: no Past BP meds: benazepril Duration of hyperlipidemia: chronic Cholesterol medication side effects: no Cholesterol supplements: none Past cholesterol medications: atorvastatin Medication compliance: excellent compliance Aspirin: yes Recent stressors: no Recurrent headaches: no Visual changes: no Palpitations: no Dyspnea: no Chest pain: no Lower extremity edema: no Dizzy/lightheaded: no  DIABETES Hypoglycemic episodes:no Polydipsia/polyuria: no Visual disturbance: no Chest pain: no Paresthesias: no Glucose Monitoring: yes Taking Insulin?: yes Blood Pressure Monitoring: not checking Retinal Examination: Up to Date Foot Exam: Up to Date Diabetic Education: Completed Pneumovax: Up to Date Influenza: Up to Date Aspirin: yes   Relevant past medical, surgical, family and social history reviewed and updated as indicated. Interim medical history since our last visit reviewed. Allergies and medications reviewed and updated.  Review of Systems  Constitutional: Negative.   Respiratory: Negative.   Cardiovascular: Negative.   Gastrointestinal: Negative.   Musculoskeletal: Negative.   Psychiatric/Behavioral: Negative.     Per HPI unless specifically indicated above     Objective:    BP (!) 134/84 (BP Location: Left Arm, Patient Position: Sitting, Cuff Size: Large)   Pulse 92   Temp 98.6 F  (37 C) (Oral)   Wt (!) 311 lb 12.8 oz (141.4 kg)   SpO2 97%   BMI 42.29 kg/m   Wt Readings from Last 3 Encounters:  12/19/19 (!) 311 lb 12.8 oz (141.4 kg)  09/09/19 (!) 313 lb 9.6 oz (142.2 kg)  03/08/19 (!) 309 lb (140.2 kg)    Physical Exam Vitals and nursing note reviewed.  Constitutional:      General: He is not in acute distress.    Appearance: Normal appearance. He is obese. He is not ill-appearing, toxic-appearing or diaphoretic.  HENT:     Head: Normocephalic and atraumatic.     Right Ear: External ear normal.     Left Ear: External ear normal.     Nose: Nose normal.     Mouth/Throat:     Mouth: Mucous membranes are moist.     Pharynx: Oropharynx is clear.  Eyes:     General: No scleral icterus.       Right eye: No discharge.        Left eye: No discharge.     Extraocular Movements: Extraocular movements intact.     Conjunctiva/sclera: Conjunctivae normal.     Pupils: Pupils are equal, round, and reactive to light.  Cardiovascular:     Rate and Rhythm: Normal rate and regular rhythm.     Pulses: Normal pulses.     Heart sounds: Normal heart sounds. No murmur heard.  No friction rub. No gallop.   Pulmonary:     Effort: Pulmonary effort is normal. No respiratory distress.     Breath sounds: Normal breath sounds. No stridor. No wheezing, rhonchi or rales.  Chest:     Chest wall:  No tenderness.  Musculoskeletal:        General: Normal range of motion.     Cervical back: Normal range of motion and neck supple.  Skin:    General: Skin is warm and dry.     Capillary Refill: Capillary refill takes less than 2 seconds.     Coloration: Skin is not jaundiced or pale.     Findings: No bruising, erythema, lesion or rash.  Neurological:     General: No focal deficit present.     Mental Status: He is alert and oriented to person, place, and time. Mental status is at baseline.  Psychiatric:        Mood and Affect: Mood normal.        Behavior: Behavior normal.         Thought Content: Thought content normal.        Judgment: Judgment normal.     Results for orders placed or performed in visit on 09/09/19  Bayer DCA Hb A1c Waived  Result Value Ref Range   HB A1C (BAYER DCA - WAIVED) 7.0 (H) <7.0 %  CBC with Differential/Platelet  Result Value Ref Range   WBC 9.2 3.4 - 10.8 x10E3/uL   RBC 5.52 4.14 - 5.80 x10E6/uL   Hemoglobin 16.7 13.0 - 17.7 g/dL   Hematocrit 49.2 37.5 - 51.0 %   MCV 89 79 - 97 fL   MCH 30.3 26.6 - 33.0 pg   MCHC 33.9 31 - 35 g/dL   RDW 12.9 11.6 - 15.4 %   Platelets 236 150 - 450 x10E3/uL   Neutrophils 43 Not Estab. %   Lymphs 43 Not Estab. %   Monocytes 8 Not Estab. %   Eos 4 Not Estab. %   Basos 1 Not Estab. %   Neutrophils Absolute 3.9 1 - 7 x10E3/uL   Lymphocytes Absolute 4.0 (H) 0 - 3 x10E3/uL   Monocytes Absolute 0.7 0 - 0 x10E3/uL   EOS (ABSOLUTE) 0.3 0.0 - 0.4 x10E3/uL   Basophils Absolute 0.1 0 - 0 x10E3/uL   Immature Granulocytes 1 Not Estab. %   Immature Grans (Abs) 0.1 0.0 - 0.1 x10E3/uL  Comprehensive metabolic panel  Result Value Ref Range   Glucose 184 (H) 65 - 99 mg/dL   BUN 12 6 - 24 mg/dL   Creatinine, Ser 0.97 0.76 - 1.27 mg/dL   GFR calc non Af Amer 88 >59 mL/min/1.73   GFR calc Af Amer 101 >59 mL/min/1.73   BUN/Creatinine Ratio 12 9 - 20   Sodium 139 134 - 144 mmol/L   Potassium 4.9 3.5 - 5.2 mmol/L   Chloride 100 96 - 106 mmol/L   CO2 25 20 - 29 mmol/L   Calcium 9.4 8.7 - 10.2 mg/dL   Total Protein 6.8 6.0 - 8.5 g/dL   Albumin 4.3 3.8 - 4.9 g/dL   Globulin, Total 2.5 1.5 - 4.5 g/dL   Albumin/Globulin Ratio 1.7 1.2 - 2.2   Bilirubin Total 0.4 0.0 - 1.2 mg/dL   Alkaline Phosphatase 101 39 - 117 IU/L   AST 29 0 - 40 IU/L   ALT 42 0 - 44 IU/L  Lipid Panel w/o Chol/HDL Ratio  Result Value Ref Range   Cholesterol, Total 121 100 - 199 mg/dL   Triglycerides 150 (H) 0 - 149 mg/dL   HDL 42 >39 mg/dL   VLDL Cholesterol Cal 26 5 - 40 mg/dL   LDL Chol Calc (NIH) 53 0 - 99 mg/dL  Microalbumin,  Urine Waived  Result Value Ref Range   Microalb, Ur Waived 10 0 - 19 mg/L   Creatinine, Urine Waived 100 10 - 300 mg/dL   Microalb/Creat Ratio <30 <30 mg/g      Assessment & Plan:   Problem List Items Addressed This Visit      Cardiovascular and Mediastinum   Hypertension - Primary    Under good control on current regimen. Continue current regimen. Continue to monitor. Call with any concerns. Refills given. Labs drawn today.       Relevant Medications   benazepril (LOTENSIN) 40 MG tablet   atorvastatin (LIPITOR) 20 MG tablet   Other Relevant Orders   Comprehensive metabolic panel   Microalbumin, Urine Waived     Endocrine   Diabetes mellitus associated with hormonal etiology ([place])    Doing great with A1c of 6.4. Continue current regimen. Continue to monitor. Call with any concerns.       Relevant Medications   benazepril (LOTENSIN) 40 MG tablet   atorvastatin (LIPITOR) 20 MG tablet   metFORMIN (GLUCOPHAGE) 500 MG tablet   Semaglutide, 1 MG/DOSE, (OZEMPIC, 1 MG/DOSE,) 2 MG/1.5ML SOPN   Other Relevant Orders   Comprehensive metabolic panel   Bayer DCA Hb A1c Waived   Microalbumin, Urine Waived     Other   Hyperlipidemia    Under good control on current regimen. Continue current regimen. Continue to monitor. Call with any concerns. Refills given. Labs drawn today.       Relevant Medications   benazepril (LOTENSIN) 40 MG tablet   atorvastatin (LIPITOR) 20 MG tablet   Other Relevant Orders   Comprehensive metabolic panel   Lipid Panel w/o Chol/HDL Ratio   Morbid obesity ([place])    Encouraged diet and exercise with goal of losing 1-2lbs per week. Continue to monitor.       Relevant Medications   metFORMIN (GLUCOPHAGE) 500 MG tablet   Semaglutide, 1 MG/DOSE, (OZEMPIC, 1 MG/DOSE,) 2 MG/1.5ML SOPN       Follow up plan: Return in about 3 months (around 03/20/2020) for Physical.

## 2019-12-19 NOTE — Assessment & Plan Note (Signed)
Doing great with A1c of 6.4. Continue current regimen. Continue to monitor. Call with any concerns.  

## 2019-12-19 NOTE — Assessment & Plan Note (Signed)
Encouraged diet and exercise with goal of losing 1-2 lbs per week. Continue to monitor.  

## 2019-12-20 LAB — COMPREHENSIVE METABOLIC PANEL
ALT: 44 IU/L (ref 0–44)
AST: 34 IU/L (ref 0–40)
Albumin/Globulin Ratio: 1.6 (ref 1.2–2.2)
Albumin: 4 g/dL (ref 3.8–4.9)
Alkaline Phosphatase: 85 IU/L (ref 48–121)
BUN/Creatinine Ratio: 13 (ref 9–20)
BUN: 13 mg/dL (ref 6–24)
Bilirubin Total: 0.5 mg/dL (ref 0.0–1.2)
CO2: 20 mmol/L (ref 20–29)
Calcium: 9.1 mg/dL (ref 8.7–10.2)
Chloride: 101 mmol/L (ref 96–106)
Creatinine, Ser: 1 mg/dL (ref 0.76–1.27)
GFR calc Af Amer: 98 mL/min/{1.73_m2} (ref >59)
GFR calc non Af Amer: 84 mL/min/{1.73_m2} (ref >59)
Globulin, Total: 2.5 g/dL (ref 1.5–4.5)
Glucose: 110 mg/dL — ABNORMAL HIGH (ref 65–99)
Potassium: 4.7 mmol/L (ref 3.5–5.2)
Sodium: 138 mmol/L (ref 134–144)
Total Protein: 6.5 g/dL (ref 6.0–8.5)

## 2019-12-20 LAB — LIPID PANEL W/O CHOL/HDL RATIO
Cholesterol, Total: 134 mg/dL (ref 100–199)
HDL: 44 mg/dL (ref >39)
LDL Chol Calc (NIH): 48 mg/dL (ref 0–99)
Triglycerides: 270 mg/dL — ABNORMAL HIGH (ref 0–149)
VLDL Cholesterol Cal: 42 mg/dL — ABNORMAL HIGH (ref 5–40)

## 2020-03-09 ENCOUNTER — Ambulatory Visit (INDEPENDENT_AMBULATORY_CARE_PROVIDER_SITE_OTHER): Payer: No Typology Code available for payment source | Admitting: Family Medicine

## 2020-03-09 ENCOUNTER — Other Ambulatory Visit: Payer: Self-pay

## 2020-03-09 ENCOUNTER — Encounter: Payer: Self-pay | Admitting: Family Medicine

## 2020-03-09 VITALS — BP 131/85 | HR 65 | Temp 98.0°F | Resp 15 | Ht 73.0 in | Wt 300.0 lb

## 2020-03-09 DIAGNOSIS — E7800 Pure hypercholesterolemia, unspecified: Secondary | ICD-10-CM

## 2020-03-09 DIAGNOSIS — I10 Essential (primary) hypertension: Secondary | ICD-10-CM

## 2020-03-09 DIAGNOSIS — Z0000 Encounter for general adult medical examination without abnormal findings: Secondary | ICD-10-CM

## 2020-03-09 DIAGNOSIS — E1169 Type 2 diabetes mellitus with other specified complication: Secondary | ICD-10-CM

## 2020-03-09 DIAGNOSIS — Z125 Encounter for screening for malignant neoplasm of prostate: Secondary | ICD-10-CM | POA: Diagnosis not present

## 2020-03-09 DIAGNOSIS — Z23 Encounter for immunization: Secondary | ICD-10-CM | POA: Diagnosis not present

## 2020-03-09 LAB — URINALYSIS, ROUTINE W REFLEX MICROSCOPIC
Bilirubin, UA: NEGATIVE
Glucose, UA: NEGATIVE
Ketones, UA: NEGATIVE
Leukocytes,UA: NEGATIVE
Nitrite, UA: NEGATIVE
Protein,UA: NEGATIVE
RBC, UA: NEGATIVE
Specific Gravity, UA: 1.015 (ref 1.005–1.030)
Urobilinogen, Ur: 0.2 mg/dL (ref 0.2–1.0)
pH, UA: 7 (ref 5.0–7.5)

## 2020-03-09 LAB — MICROALBUMIN, URINE WAIVED
Creatinine, Urine Waived: 100 mg/dL (ref 10–300)
Microalb, Ur Waived: 10 mg/L (ref 0–19)
Microalb/Creat Ratio: <30 mg/g (ref <30)

## 2020-03-09 LAB — BAYER DCA HB A1C WAIVED: HB A1C (BAYER DCA - WAIVED): 6.3 % (ref <7.0)

## 2020-03-09 NOTE — Progress Notes (Signed)
BP 131/85   Pulse 65   Temp 98 F (36.7 C) (Oral)   Resp 15   Ht 6\' 1"  (1.854 m)   Wt 300 lb (136.1 kg)   BMI 39.58 kg/m    Subjective:    Patient ID: Cody Burke, male    DOB: 01/01/1966, 54 y.o.   MRN: [id number]  HPI: Cody Burke is a 54 y.o. male presenting on 03/09/2020 for comprehensive medical examination. Current medical complaints include:  HYPERTENSION / HYPERLIPIDEMIA Satisfied with current treatment? yes Duration of hypertension: chronic BP monitoring frequency: not checking BP range:  BP medication side effects: no Past BP meds: benezpril Duration of hyperlipidemia: chronic Cholesterol medication side effects: no Cholesterol supplements: fish oil Past cholesterol medications: atorvastatin Medication compliance: excellent compliance Aspirin: yes Recent stressors: no Recurrent headaches: no Visual changes: no Palpitations: no Dyspnea: no Chest pain: no Lower extremity edema: no Dizzy/lightheaded: no  DIABETES Hypoglycemic episodes:no Polydipsia/polyuria: no Visual disturbance: no Chest pain: no Paresthesias: no Glucose Monitoring: yes  Accucheck frequency: about weekly  Fasting glucose:150  Taking Insulin?: no Blood Pressure Monitoring: not checking Retinal Examination: Up to Date Foot Exam: Done today Diabetic Education: Completed Pneumovax: Up to Date Influenza: given today Aspirin: yes  Interim Problems from his last visit: no  Depression Screen done today and results listed below:  Depression screen [facility] 2/9 03/09/2020 03/08/2019 01/05/2018 09/02/2017 06/03/2017  Decreased Interest 0 1 0 0 0  Down, Depressed, Hopeless 0 1 0 0 0  PHQ - 2 Score 0 2 0 0 0  Altered sleeping 0 0 0 - -  Tired, decreased energy 0 0 0 - -  Change in appetite 0 0 0 - -  Feeling bad or failure about yourself  0 0 0 - -  Trouble concentrating 0 0 0 - -  Moving slowly or fidgety/restless 0 0 0 - -  Suicidal thoughts 0 0 0 - -  PHQ-9 Score 0 2 0 - -  Difficult  doing work/chores Not difficult at all Not difficult at all - - -    Past Medical History:  Past Medical History:  Diagnosis Date  . Diabetes mellitus without complication ([place])   . Hyperlipidemia   . Hypertension   . Peripheral vascular disease ([place])   . Sleep apnea    severe - new DX - has not gotten CPAP yet    Surgical History:  Past Surgical History:  Procedure Laterality Date  . COLONOSCOPY WITH PROPOFOL N/A 09/21/2015   Procedure: COLONOSCOPY WITH PROPOFOL;  Surgeon: Lucilla Lame, MD;  Location: [place];  Service: Endoscopy;  Laterality: N/A;  Diabetic - oral meds sleep apnea  . VASCULAR SURGERY Bilateral    legs    Medications:  Current Outpatient Medications on File Prior to Visit  Medication Sig  . aspirin EC 81 MG tablet Take 81 mg by mouth daily.  Marland Kitchen atorvastatin (LIPITOR) 20 MG tablet Take 1 tablet (20 mg total) by mouth daily.  . benazepril (LOTENSIN) 40 MG tablet Take 1 tablet (40 mg total) by mouth daily.  Marland Kitchen glucose blood test strip Please dispense insurance preference. Use to check BS BID  . Insulin Pen Needle (PEN NEEDLES) 31G X 8 MM MISC 1 each by Does not apply route once a week.  . meloxicam (MOBIC) 15 MG tablet meloxicam 15 mg tablet  Take 1 tablet every day by oral route.  . metFORMIN (GLUCOPHAGE) 500 MG tablet Take 2 tablets (1,000 mg total) by mouth 2 (two)  times daily with a meal.  . Multiple Vitamins-Minerals (MULTIVITAMIN GUMMIES ADULT PO) Take by mouth daily.  . Omega-3 Fatty Acids (FISH OIL) 1000 MG CAPS Take 1 mg by mouth daily.  . Semaglutide, 1 MG/DOSE, (OZEMPIC, 1 MG/DOSE,) 2 MG/1.5ML SOPN Inject 0.75 mLs (1 mg total) into the skin once a week.   No current facility-administered medications on file prior to visit.    Allergies:  No Known Allergies  Social History:  Social History   Socioeconomic History  . Marital status: Unknown    Spouse name: Not on file  . Number of children: Not on file  . Years of education: Not on  file  . Highest education level: Not on file  Occupational History  . Not on file  Tobacco Use  . Smoking status: Former Smoker    Types: Cigarettes    Quit date: 07/30/2003    Years since quitting: 16.6  . Smokeless tobacco: Never Used  Vaping Use  . Vaping Use: Never used  Substance and Sexual Activity  . Alcohol use: Yes    Comment: very little  . Drug use: No  . Sexual activity: Not on file  Other Topics Concern  . Not on file  Social History Narrative  . Not on file   Social Determinants of Health   Financial Resource Strain:   . Difficulty of Paying Living Expenses: Not on file  Food Insecurity:   . Worried About Charity fundraiser in the Last Year: Not on file  . Ran Out of Food in the Last Year: Not on file  Transportation Needs:   . Lack of Transportation (Medical): Not on file  . Lack of Transportation (Non-Medical): Not on file  Physical Activity:   . Days of Exercise per Week: Not on file  . Minutes of Exercise per Session: Not on file  Stress:   . Feeling of Stress : Not on file  Social Connections:   . Frequency of Communication with Friends and Family: Not on file  . Frequency of Social Gatherings with Friends and Family: Not on file  . Attends Religious Services: Not on file  . Active Member of Clubs or Organizations: Not on file  . Attends Archivist Meetings: Not on file  . Marital Status: Not on file  Intimate Partner Violence:   . Fear of Current or Ex-Partner: Not on file  . Emotionally Abused: Not on file  . Physically Abused: Not on file  . Sexually Abused: Not on file   Social History   Tobacco Use  Smoking Status Former Smoker  . Types: Cigarettes  . Quit date: 07/30/2003  . Years since quitting: 16.6  Smokeless Tobacco Never Used   Social History   Substance and Sexual Activity  Alcohol Use Yes   Comment: very little    Family History:  Family History  Problem Relation Age of Onset  . Cancer Mother   . Hypertension  Father     Past medical history, surgical history, medications, allergies, family history and social history reviewed with patient today and changes made to appropriate areas of the chart.   Review of Systems  Constitutional: Negative.   HENT: Negative.   Eyes: Negative.   Respiratory: Negative.   Cardiovascular: Negative.   Gastrointestinal: Negative.   Genitourinary: Negative.   Musculoskeletal: Negative.   Skin: Negative.   Neurological: Negative.   Endo/Heme/Allergies: Positive for environmental allergies. Negative for polydipsia. Does not bruise/bleed easily.  Psychiatric/Behavioral: Negative.  All other ROS negative except what is listed above and in the HPI.      Objective:    BP 131/85   Pulse 65   Temp 98 F (36.7 C) (Oral)   Resp 15   Ht 6\' 1"  (1.854 m)   Wt 300 lb (136.1 kg)   BMI 39.58 kg/m   Wt Readings from Last 3 Encounters:  03/09/20 300 lb (136.1 kg)  12/19/19 (!) 311 lb 12.8 oz (141.4 kg)  09/09/19 (!) 313 lb 9.6 oz (142.2 kg)    Physical Exam Vitals and nursing note reviewed.  Constitutional:      General: He is not in acute distress.    Appearance: Normal appearance. He is obese. He is not ill-appearing, toxic-appearing or diaphoretic.  HENT:     Head: Normocephalic and atraumatic.     Right Ear: Tympanic membrane, ear canal and external ear normal. There is no impacted cerumen.     Left Ear: Tympanic membrane, ear canal and external ear normal. There is no impacted cerumen.     Nose: Nose normal. No congestion or rhinorrhea.     Mouth/Throat:     Mouth: Mucous membranes are moist.     Pharynx: Oropharynx is clear. No oropharyngeal exudate or posterior oropharyngeal erythema.  Eyes:     General: No scleral icterus.       Right eye: No discharge.        Left eye: No discharge.     Extraocular Movements: Extraocular movements intact.     Conjunctiva/sclera: Conjunctivae normal.     Pupils: Pupils are equal, round, and reactive to light.    Neck:     Vascular: No carotid bruit.  Cardiovascular:     Rate and Rhythm: Normal rate and regular rhythm.     Pulses: Normal pulses.     Heart sounds: No murmur heard.  No friction rub. No gallop.   Pulmonary:     Effort: Pulmonary effort is normal. No respiratory distress.     Breath sounds: Normal breath sounds. No stridor. No wheezing, rhonchi or rales.  Chest:     Chest wall: No tenderness.  Abdominal:     General: Abdomen is flat. Bowel sounds are normal. There is no distension.     Palpations: Abdomen is soft. There is no mass.     Tenderness: There is no abdominal tenderness. There is no right CVA tenderness, left CVA tenderness, guarding or rebound.     Hernia: No hernia is present.  Genitourinary:    Comments: Genital exam deferred with shared decision making Musculoskeletal:        General: No swelling, tenderness, deformity or signs of injury.     Cervical back: Normal range of motion and neck supple. No rigidity. No muscular tenderness.     Right lower leg: No edema.     Left lower leg: No edema.  Lymphadenopathy:     Cervical: No cervical adenopathy.  Skin:    General: Skin is warm and dry.     Capillary Refill: Capillary refill takes less than 2 seconds.     Coloration: Skin is not jaundiced or pale.     Findings: No bruising, erythema, lesion or rash.  Neurological:     General: No focal deficit present.     Mental Status: He is alert and oriented to person, place, and time.     Cranial Nerves: No cranial nerve deficit.     Sensory: No sensory deficit.  Motor: No weakness.     Coordination: Coordination normal.     Gait: Gait normal.     Deep Tendon Reflexes: Reflexes normal.  Psychiatric:        Mood and Affect: Mood normal.        Behavior: Behavior normal.        Thought Content: Thought content normal.        Judgment: Judgment normal.     Results for orders placed or performed in visit on 03/09/20  Bayer DCA Hb A1c Waived  Result Value Ref  Range   HB A1C (BAYER DCA - WAIVED) 6.3 <7.0 %  CBC with Differential/Platelet  Result Value Ref Range   WBC 10.5 3.4 - 10.8 x10E3/uL   RBC 5.40 4.14 - 5.80 x10E6/uL   Hemoglobin 17.0 13.0 - 17.7 g/dL   Hematocrit 49.0 37.5 - 51.0 %   MCV 91 79 - 97 fL   MCH 31.5 26.6 - 33.0 pg   MCHC 34.7 31 - 35 g/dL   RDW 12.9 11.6 - 15.4 %   Platelets 253 150 - 450 x10E3/uL   Neutrophils 44 Not Estab. %   Lymphs 41 Not Estab. %   Monocytes 10 Not Estab. %   Eos 3 Not Estab. %   Basos 1 Not Estab. %   Neutrophils Absolute 4.8 1 - 7 x10E3/uL   Lymphocytes Absolute 4.3 (H) 0 - 3 x10E3/uL   Monocytes Absolute 1.0 (H) 0 - 0 x10E3/uL   EOS (ABSOLUTE) 0.3 0.0 - 0.4 x10E3/uL   Basophils Absolute 0.1 0 - 0 x10E3/uL   Immature Granulocytes 1 Not Estab. %   Immature Grans (Abs) 0.1 0.0 - 0.1 x10E3/uL  Comprehensive metabolic panel  Result Value Ref Range   Glucose 129 (H) 65 - 99 mg/dL   BUN 15 6 - 24 mg/dL   Creatinine, Ser 0.95 0.76 - 1.27 mg/dL   GFR calc non Af Amer 90 >59 mL/min/1.73   GFR calc Af Amer 104 >59 mL/min/1.73   BUN/Creatinine Ratio 16 9 - 20   Sodium 136 134 - 144 mmol/L   Potassium 4.8 3.5 - 5.2 mmol/L   Chloride 98 96 - 106 mmol/L   CO2 25 20 - 29 mmol/L   Calcium 9.5 8.7 - 10.2 mg/dL   Total Protein 7.1 6.0 - 8.5 g/dL   Albumin 4.5 3.8 - 4.9 g/dL   Globulin, Total 2.6 1.5 - 4.5 g/dL   Albumin/Globulin Ratio 1.7 1.2 - 2.2   Bilirubin Total 0.5 0.0 - 1.2 mg/dL   Alkaline Phosphatase 84 44 - 121 IU/L   AST 32 0 - 40 IU/L   ALT 41 0 - 44 IU/L  Lipid Panel w/o Chol/HDL Ratio  Result Value Ref Range   Cholesterol, Total 121 100 - 199 mg/dL   Triglycerides 113 0 - 149 mg/dL   HDL 47 >39 mg/dL   VLDL Cholesterol Cal 21 5 - 40 mg/dL   LDL Chol Calc (NIH) 53 0 - 99 mg/dL  Microalbumin, Urine Waived  Result Value Ref Range   Microalb, Ur Waived 10 0 - 19 mg/L   Creatinine, Urine Waived 100 10 - 300 mg/dL   Microalb/Creat Ratio <30 <30 mg/g  PSA  Result Value Ref Range    Prostate Specific Ag, Serum <0.1 0.0 - 4.0 ng/mL  TSH  Result Value Ref Range   TSH 2.160 0.450 - 4.500 uIU/mL  Urinalysis, Routine w reflex microscopic  Result Value Ref Range   Specific Gravity,  UA 1.015 1.005 - 1.030   pH, UA 7.0 5.0 - 7.5   Color, UA Yellow Yellow   Appearance Ur Clear Clear   Leukocytes,UA Negative Negative   Protein,UA Negative Negative/Trace   Glucose, UA Negative Negative   Ketones, UA Negative Negative   RBC, UA Negative Negative   Bilirubin, UA Negative Negative   Urobilinogen, Ur 0.2 0.2 - 1.0 mg/dL   Nitrite, UA Negative Negative      Assessment & Plan:   Problem List Items Addressed This Visit      Cardiovascular and Mediastinum   Hypertension    Under good control on current regimen. Continue current regimen. Continue to monitor. Call with any concerns. Refills given. Labs drawn today.        Relevant Orders   CBC with Differential/Platelet (Completed)   Comprehensive metabolic panel (Completed)   Microalbumin, Urine Waived (Completed)   TSH (Completed)     Endocrine   Diabetes mellitus associated with hormonal etiology ([place])    Under good control with A1c of 6.2. Continue current regiment. Continue to monitor. Call with any concerns.       Relevant Orders   Bayer DCA Hb A1c Waived (Completed)   CBC with Differential/Platelet (Completed)   Comprehensive metabolic panel (Completed)   Microalbumin, Urine Waived (Completed)   Urinalysis, Routine w reflex microscopic (Completed)     Other   Hyperlipidemia    Under good control on current regimen. Continue current regimen. Continue to monitor. Call with any concerns. Refills given. Labs drawn today.        Relevant Orders   CBC with Differential/Platelet (Completed)   Comprehensive metabolic panel (Completed)   Lipid Panel w/o Chol/HDL Ratio (Completed)   Morbid obesity (HCC)    Encouraged diet and exercise with goal of losing 1-2lbs per week. Continue to monitor.       Other  Visit Diagnoses    Routine general medical examination at a health care facility    -  Primary   Vaccines up to date. Screening labs checked today. Colonoscopy up to date. Continue diet and exercise. Call with any concerns. Continue to monitor.    Screening for prostate cancer       Labs drawn today. Await results.    Relevant Orders   PSA (Completed)   Need for influenza vaccination       Flu shot given today.    Relevant Orders   Flu Vaccine QUAD 36+ mos IM (Completed)       Discussed aspirin prophylaxis for myocardial infarction prevention and decision was made to continue ASA  LABORATORY TESTING:  Health maintenance labs ordered today as discussed above.   The natural history of prostate cancer and ongoing controversy regarding screening and potential treatment outcomes of prostate cancer has been discussed with the patient. The meaning of a false positive PSA and a false negative PSA has been discussed. He indicates understanding of the limitations of this screening test and wishes to proceed with screening PSA testing.   IMMUNIZATIONS:   - Tdap: Tetanus vaccination status reviewed: last tetanus booster within 10 years. - Influenza: Administered today - Pneumovax: Up to date - Prevnar: N/A - COVID: Up to date   SCREENING: - Colonoscopy: Up to date  Discussed with patient purpose of the colonoscopy is to detect colon cancer at curable precancerous or early stages   PATIENT COUNSELING:    Sexuality: Discussed sexually transmitted diseases, partner selection, use of condoms, avoidance of unintended pregnancy  and  contraceptive alternatives.   Advised to avoid cigarette smoking.  I discussed with the patient that most people either abstain from alcohol or drink within safe limits (<=14/week and <=4 drinks/occasion for males, <=7/weeks and <= 3 drinks/occasion for females) and that the risk for alcohol disorders and other health effects rises proportionally with the number of  drinks per week and how often a drinker exceeds daily limits.  Discussed cessation/primary prevention of drug use and availability of treatment for abuse.   Diet: Encouraged to adjust caloric intake to maintain  or achieve ideal body weight, to reduce intake of dietary saturated fat and total fat, to limit sodium intake by avoiding high sodium foods and not adding table salt, and to maintain adequate dietary potassium and calcium preferably from fresh fruits, vegetables, and low-fat dairy products.    stressed the importance of regular exercise  Injury prevention: Discussed safety belts, safety helmets, smoke detector, smoking near bedding or upholstery.   Dental health: Discussed importance of regular tooth brushing, flossing, and dental visits.   Follow up plan: NEXT PREVENTATIVE PHYSICAL DUE IN 1 YEAR. Return in about 6 months (around 09/07/2020).

## 2020-03-09 NOTE — Patient Instructions (Signed)

## 2020-03-10 LAB — COMPREHENSIVE METABOLIC PANEL
ALT: 41 IU/L (ref 0–44)
AST: 32 IU/L (ref 0–40)
Albumin/Globulin Ratio: 1.7 (ref 1.2–2.2)
Albumin: 4.5 g/dL (ref 3.8–4.9)
Alkaline Phosphatase: 84 IU/L (ref 44–121)
BUN/Creatinine Ratio: 16 (ref 9–20)
BUN: 15 mg/dL (ref 6–24)
Bilirubin Total: 0.5 mg/dL (ref 0.0–1.2)
CO2: 25 mmol/L (ref 20–29)
Calcium: 9.5 mg/dL (ref 8.7–10.2)
Chloride: 98 mmol/L (ref 96–106)
Creatinine, Ser: 0.95 mg/dL (ref 0.76–1.27)
GFR calc Af Amer: 104 mL/min/{1.73_m2} (ref >59)
GFR calc non Af Amer: 90 mL/min/{1.73_m2} (ref >59)
Globulin, Total: 2.6 g/dL (ref 1.5–4.5)
Glucose: 129 mg/dL — ABNORMAL HIGH (ref 65–99)
Potassium: 4.8 mmol/L (ref 3.5–5.2)
Sodium: 136 mmol/L (ref 134–144)
Total Protein: 7.1 g/dL (ref 6.0–8.5)

## 2020-03-10 LAB — CBC WITH DIFFERENTIAL/PLATELET
Basophils Absolute: 0.1 10*3/uL (ref 0.0–0.2)
Basos: 1 %
EOS (ABSOLUTE): 0.3 10*3/uL (ref 0.0–0.4)
Eos: 3 %
Hematocrit: 49 % (ref 37.5–51.0)
Hemoglobin: 17 g/dL (ref 13.0–17.7)
Immature Grans (Abs): 0.1 10*3/uL (ref 0.0–0.1)
Immature Granulocytes: 1 %
Lymphocytes Absolute: 4.3 10*3/uL — ABNORMAL HIGH (ref 0.7–3.1)
Lymphs: 41 %
MCH: 31.5 pg (ref 26.6–33.0)
MCHC: 34.7 g/dL (ref 31.5–35.7)
MCV: 91 fL (ref 79–97)
Monocytes Absolute: 1 10*3/uL — ABNORMAL HIGH (ref 0.1–0.9)
Monocytes: 10 %
Neutrophils Absolute: 4.8 10*3/uL (ref 1.4–7.0)
Neutrophils: 44 %
Platelets: 253 10*3/uL (ref 150–450)
RBC: 5.4 x10E6/uL (ref 4.14–5.80)
RDW: 12.9 % (ref 11.6–15.4)
WBC: 10.5 10*3/uL (ref 3.4–10.8)

## 2020-03-10 LAB — LIPID PANEL W/O CHOL/HDL RATIO
Cholesterol, Total: 121 mg/dL (ref 100–199)
HDL: 47 mg/dL (ref >39)
LDL Chol Calc (NIH): 53 mg/dL (ref 0–99)
Triglycerides: 113 mg/dL (ref 0–149)
VLDL Cholesterol Cal: 21 mg/dL (ref 5–40)

## 2020-03-10 LAB — TSH: TSH: 2.16 u[IU]/mL (ref 0.450–4.500)

## 2020-03-10 LAB — PSA: Prostate Specific Ag, Serum: <0.1 ng/mL (ref 0.0–4.0)

## 2020-03-11 NOTE — Assessment & Plan Note (Signed)
Under good control with A1c of 6.2. Continue current regiment. Continue to monitor. Call with any concerns.

## 2020-03-11 NOTE — Assessment & Plan Note (Signed)
Encouraged diet and exercise with goal of losing 1-2 lbs per week. Continue to monitor.  

## 2020-03-11 NOTE — Assessment & Plan Note (Signed)
Under good control on current regimen. Continue current regimen. Continue to monitor. Call with any concerns. Refills given. Labs drawn today.   

## 2020-06-15 LAB — HM DIABETES EYE EXAM

## 2020-07-17 ENCOUNTER — Other Ambulatory Visit: Payer: Self-pay | Admitting: Family Medicine

## 2020-07-17 NOTE — Telephone Encounter (Signed)
Requested Prescriptions  Pending Prescriptions Disp Refills  . atorvastatin (LIPITOR) 20 MG tablet [Pharmacy Med Name: Atorvastatin Calcium 20 MG Oral Tablet] 90 tablet 2    Sig: TAKE 1 TABLET BY MOUTH  DAILY     Cardiovascular:  Antilipid - Statins Failed - 07/17/2020  5:34 AM      Failed - LDL in normal range and within 360 days    LDL Chol Calc (NIH)  Date Value Ref Range Status  03/09/2020 53 0 - 99 mg/dL Final         Passed - Total Cholesterol in normal range and within 360 days    Cholesterol, Total  Date Value Ref Range Status  03/09/2020 121 100 - 199 mg/dL Final   Cholesterol Piccolo, Waived  Date Value Ref Range Status  07/28/2018 168 <200 mg/dL Final    Comment:                            Desirable                <200                         Borderline High      200- 239                         High                     >239          Passed - HDL in normal range and within 360 days    HDL  Date Value Ref Range Status  03/09/2020 47 >39 mg/dL Final         Passed - Triglycerides in normal range and within 360 days    Triglycerides  Date Value Ref Range Status  03/09/2020 113 0 - 149 mg/dL Final   Triglycerides Piccolo,Waived  Date Value Ref Range Status  07/28/2018 319 (H) <150 mg/dL Final    Comment:                            Normal                   <150                         Borderline High     150 - 199                         High                200 - 499                         Very High                >499          Passed - Patient is not pregnant      Passed - Valid encounter within last 12 months    Recent Outpatient Visits          4 months ago Routine general medical examination at a health care facility   [facility], Megan P, DO   7 months ago Essential hypertension  [place], Megan P, DO   10 months ago Essential hypertension   [place], Megan P, DO   1 year ago  Diabetes mellitus associated with hormonal etiology ([place])   [place], Megan P, DO   1 year ago Diabetes mellitus associated with hormonal etiology ([place])   [place], Megan P, DO      Future Appointments            In 1 month Johnson, Megan P, DO [facility]           . benazepril (LOTENSIN) 40 MG tablet [Pharmacy Med Name: Benazepril HCl 40 MG Oral Tablet] 90 tablet 2    Sig: TAKE 1 TABLET BY MOUTH  DAILY     Cardiovascular:  ACE Inhibitors Passed - 07/17/2020  5:34 AM      Passed - Cr in normal range and within 180 days    Creatinine, Ser  Date Value Ref Range Status  03/09/2020 0.95 0.76 - 1.27 mg/dL Final         Passed - K in normal range and within 180 days    Potassium  Date Value Ref Range Status  03/09/2020 4.8 3.5 - 5.2 mmol/L Final         Passed - Patient is not pregnant      Passed - Last BP in normal range    BP Readings from Last 1 Encounters:  03/09/20 131/85         Passed - Valid encounter within last 6 months    Recent Outpatient Visits          4 months ago Routine general medical examination at a health care facility   [facility], [place], DO   7 months ago Essential hypertension   [place], [place], DO   10 months ago Essential hypertension   [place], [place] P, DO   1 year ago Diabetes mellitus associated with hormonal etiology [facility])   [place], Megan P, DO   1 year ago Diabetes mellitus associated with hormonal etiology [facility])   [place], Megan P, DO      Future Appointments            In 1 month Johnson, Barb Mer[place], DO [facility], [facility]

## 2020-08-27 ENCOUNTER — Encounter: Payer: Self-pay | Admitting: Family Medicine

## 2020-08-27 ENCOUNTER — Telehealth: Payer: Self-pay

## 2020-08-27 NOTE — Telephone Encounter (Signed)
Lvm to r/s 4/15 appt

## 2020-09-07 ENCOUNTER — Ambulatory Visit: Payer: No Typology Code available for payment source | Admitting: Family Medicine

## 2020-09-13 ENCOUNTER — Ambulatory Visit: Payer: No Typology Code available for payment source | Admitting: Family Medicine

## 2020-09-13 ENCOUNTER — Encounter: Payer: Self-pay | Admitting: Family Medicine

## 2020-09-13 ENCOUNTER — Other Ambulatory Visit: Payer: Self-pay

## 2020-09-13 VITALS — BP 138/86 | HR 69 | Temp 98.0°F | Wt 307.0 lb

## 2020-09-13 DIAGNOSIS — E7800 Pure hypercholesterolemia, unspecified: Secondary | ICD-10-CM

## 2020-09-13 DIAGNOSIS — I10 Essential (primary) hypertension: Secondary | ICD-10-CM

## 2020-09-13 DIAGNOSIS — E1169 Type 2 diabetes mellitus with other specified complication: Secondary | ICD-10-CM | POA: Diagnosis not present

## 2020-09-13 LAB — BAYER DCA HB A1C WAIVED: HB A1C (BAYER DCA - WAIVED): 6.5 % (ref <7.0)

## 2020-09-13 MED ORDER — METFORMIN HCL 500 MG PO TABS: 1000.0000 mg | ORAL_TABLET | Freq: Two times a day (BID) | ORAL | 1 refills | Status: DC

## 2020-09-13 MED ORDER — OZEMPIC (1 MG/DOSE) 2 MG/1.5ML ~~LOC~~ SOPN: 1.0000 mg | PEN_INJECTOR | SUBCUTANEOUS | 1 refills | Status: DC

## 2020-09-13 NOTE — Assessment & Plan Note (Signed)
Under good control on current regimen. Continue current regimen. Continue to monitor. Call with any concerns. Refills given. Labs drawn today.   

## 2020-09-13 NOTE — Assessment & Plan Note (Signed)
Doing well with a1c of 6.5- will continue current regimen and watch diet. Recheck 6 months. Call with any concerns.

## 2020-09-13 NOTE — Progress Notes (Signed)
BP 138/86   Pulse 69   Temp 98 F (36.7 C)   Wt (!) 307 lb (139.3 kg)   SpO2 100%   BMI 40.50 kg/m    Subjective:    Patient ID: Cody Burke, male    DOB: 1965-08-16, 55 y.o.   MRN: [id number]  HPI: Cody Burke is a 55 y.o. male  Chief Complaint  Patient presents with  . Hypertension  . Diabetes  . Hyperlipidemia   DIABETES Hypoglycemic episodes:no Polydipsia/polyuria: no Visual disturbance: no Chest pain: no Paresthesias: no Glucose Monitoring: no  Accucheck frequency: Not Checking Taking Insulin?: no Blood Pressure Monitoring: not checking Retinal Examination: Up to Date Foot Exam: Up to Date Diabetic Education: Completed Pneumovax: Up to Date Influenza: Up to Date Aspirin: yes  HYPERTENSION / HYPERLIPIDEMIA Satisfied with current treatment? yes Duration of hypertension: chronic BP monitoring frequency: not checking BP medication side effects: no Past BP meds: benazepril Duration of hyperlipidemia: chronic Cholesterol medication side effects: no Cholesterol supplements: none Past cholesterol medications: atorvastatin Medication compliance: excellent compliance Aspirin: yes Recent stressors: yes Recurrent headaches: no Visual changes: no Palpitations: no Dyspnea: no Chest pain: no Lower extremity edema: no Dizzy/lightheaded: no  Relevant past medical, surgical, family and social history reviewed and updated as indicated. Interim medical history since our last visit reviewed. Allergies and medications reviewed and updated.  Review of Systems  Constitutional: Negative.   Respiratory: Negative.   Cardiovascular: Negative.   Gastrointestinal: Negative.   Musculoskeletal: Negative.   Psychiatric/Behavioral: Negative.     Per HPI unless specifically indicated above     Objective:    BP 138/86   Pulse 69   Temp 98 F (36.7 C)   Wt (!) 307 lb (139.3 kg)   SpO2 100%   BMI 40.50 kg/m   Wt Readings from Last 3 Encounters:  09/13/20 (!) 307  lb (139.3 kg)  03/09/20 300 lb (136.1 kg)  12/19/19 (!) 311 lb 12.8 oz (141.4 kg)    Physical Exam Vitals and nursing note reviewed.  Constitutional:      General: He is not in acute distress.    Appearance: Normal appearance. He is not ill-appearing, toxic-appearing or diaphoretic.  HENT:     Head: Normocephalic and atraumatic.     Right Ear: External ear normal.     Left Ear: External ear normal.     Nose: Nose normal.     Mouth/Throat:     Mouth: Mucous membranes are moist.     Pharynx: Oropharynx is clear.  Eyes:     General: No scleral icterus.       Right eye: No discharge.        Left eye: No discharge.     Extraocular Movements: Extraocular movements intact.     Conjunctiva/sclera: Conjunctivae normal.     Pupils: Pupils are equal, round, and reactive to light.  Cardiovascular:     Rate and Rhythm: Normal rate and regular rhythm.     Pulses: Normal pulses.     Heart sounds: Normal heart sounds. No murmur heard. No friction rub. No gallop.   Pulmonary:     Effort: Pulmonary effort is normal. No respiratory distress.     Breath sounds: Normal breath sounds. No stridor. No wheezing, rhonchi or rales.  Chest:     Chest wall: No tenderness.  Musculoskeletal:        General: Normal range of motion.     Cervical back: Normal range of motion and neck supple.  Skin:  General: Skin is warm and dry.     Capillary Refill: Capillary refill takes less than 2 seconds.     Coloration: Skin is not jaundiced or pale.     Findings: No bruising, erythema, lesion or rash.  Neurological:     General: No focal deficit present.     Mental Status: He is alert and oriented to person, place, and time. Mental status is at baseline.  Psychiatric:        Mood and Affect: Mood normal.        Behavior: Behavior normal.        Thought Content: Thought content normal.        Judgment: Judgment normal.     Results for orders placed or performed in visit on 06/18/20  HM DIABETES EYE EXAM   Result Value Ref Range   HM Diabetic Eye Exam No Retinopathy No Retinopathy      Assessment & Plan:   Problem List Items Addressed This Visit      Cardiovascular and Mediastinum   Hypertension - Primary    Under good control on current regimen. Continue current regimen. Continue to monitor. Call with any concerns. Refills given. Labs drawn today.        Relevant Orders   CBC with Differential/Platelet   Comprehensive metabolic panel     Endocrine   Diabetes mellitus associated with hormonal etiology ([place])    Doing well with a1c of 6.5- will continue current regimen and watch diet. Recheck 6 months. Call with any concerns.       Relevant Medications   Semaglutide, 1 MG/DOSE, (OZEMPIC, 1 MG/DOSE,) 2 MG/1.5ML SOPN   metFORMIN (GLUCOPHAGE) 500 MG tablet   Other Relevant Orders   Bayer DCA Hb A1c Waived   CBC with Differential/Platelet   Comprehensive metabolic panel     Other   Hyperlipidemia    Under good control on current regimen. Continue current regimen. Continue to monitor. Call with any concerns. Refills given. Labs drawn today.       Relevant Orders   CBC with Differential/Platelet   Comprehensive metabolic panel   Lipid Panel w/o Chol/HDL Ratio       Follow up plan: Return in about 6 months (around 03/15/2021) for physical.

## 2020-09-14 LAB — COMPREHENSIVE METABOLIC PANEL
ALT: 45 IU/L — ABNORMAL HIGH (ref 0–44)
AST: 27 IU/L (ref 0–40)
Albumin/Globulin Ratio: 1.8 (ref 1.2–2.2)
Albumin: 4.5 g/dL (ref 3.8–4.9)
Alkaline Phosphatase: 87 IU/L (ref 44–121)
BUN/Creatinine Ratio: 15 (ref 9–20)
BUN: 13 mg/dL (ref 6–24)
Bilirubin Total: 0.5 mg/dL (ref 0.0–1.2)
CO2: 23 mmol/L (ref 20–29)
Calcium: 9.2 mg/dL (ref 8.7–10.2)
Chloride: 98 mmol/L (ref 96–106)
Creatinine, Ser: 0.88 mg/dL (ref 0.76–1.27)
Globulin, Total: 2.5 g/dL (ref 1.5–4.5)
Glucose: 127 mg/dL — ABNORMAL HIGH (ref 65–99)
Potassium: 4.7 mmol/L (ref 3.5–5.2)
Sodium: 137 mmol/L (ref 134–144)
Total Protein: 7 g/dL (ref 6.0–8.5)
eGFR: 101 mL/min/{1.73_m2} (ref >59)

## 2020-09-14 LAB — CBC WITH DIFFERENTIAL/PLATELET
Basophils Absolute: 0.1 10*3/uL (ref 0.0–0.2)
Basos: 1 %
EOS (ABSOLUTE): 0.4 10*3/uL (ref 0.0–0.4)
Eos: 3 %
Hematocrit: 51.1 % — ABNORMAL HIGH (ref 37.5–51.0)
Hemoglobin: 17 g/dL (ref 13.0–17.7)
Immature Grans (Abs): 0.1 10*3/uL (ref 0.0–0.1)
Immature Granulocytes: 1 %
Lymphocytes Absolute: 4.5 10*3/uL — ABNORMAL HIGH (ref 0.7–3.1)
Lymphs: 41 %
MCH: 30.4 pg (ref 26.6–33.0)
MCHC: 33.3 g/dL (ref 31.5–35.7)
MCV: 91 fL (ref 79–97)
Monocytes Absolute: 1 10*3/uL — ABNORMAL HIGH (ref 0.1–0.9)
Monocytes: 9 %
Neutrophils Absolute: 5 10*3/uL (ref 1.4–7.0)
Neutrophils: 45 %
Platelets: 240 10*3/uL (ref 150–450)
RBC: 5.59 x10E6/uL (ref 4.14–5.80)
RDW: 13 % (ref 11.6–15.4)
WBC: 11.2 10*3/uL — ABNORMAL HIGH (ref 3.4–10.8)

## 2020-09-14 LAB — LIPID PANEL W/O CHOL/HDL RATIO
Cholesterol, Total: 139 mg/dL (ref 100–199)
HDL: 46 mg/dL (ref >39)
LDL Chol Calc (NIH): 59 mg/dL (ref 0–99)
Triglycerides: 212 mg/dL — ABNORMAL HIGH (ref 0–149)
VLDL Cholesterol Cal: 34 mg/dL (ref 5–40)

## 2021-03-14 ENCOUNTER — Other Ambulatory Visit: Payer: Self-pay

## 2021-03-14 ENCOUNTER — Ambulatory Visit (INDEPENDENT_AMBULATORY_CARE_PROVIDER_SITE_OTHER): Payer: No Typology Code available for payment source | Admitting: Family Medicine

## 2021-03-14 ENCOUNTER — Encounter: Payer: Self-pay | Admitting: Family Medicine

## 2021-03-14 VITALS — BP 140/84 | HR 68 | Ht 73.0 in | Wt 307.0 lb

## 2021-03-14 DIAGNOSIS — Z0000 Encounter for general adult medical examination without abnormal findings: Secondary | ICD-10-CM

## 2021-03-14 DIAGNOSIS — Z125 Encounter for screening for malignant neoplasm of prostate: Secondary | ICD-10-CM

## 2021-03-14 DIAGNOSIS — D582 Other hemoglobinopathies: Secondary | ICD-10-CM

## 2021-03-14 DIAGNOSIS — Z23 Encounter for immunization: Secondary | ICD-10-CM | POA: Diagnosis not present

## 2021-03-14 DIAGNOSIS — E7800 Pure hypercholesterolemia, unspecified: Secondary | ICD-10-CM | POA: Diagnosis not present

## 2021-03-14 DIAGNOSIS — I10 Essential (primary) hypertension: Secondary | ICD-10-CM | POA: Diagnosis not present

## 2021-03-14 DIAGNOSIS — E1169 Type 2 diabetes mellitus with other specified complication: Secondary | ICD-10-CM | POA: Diagnosis not present

## 2021-03-14 LAB — URINALYSIS, ROUTINE W REFLEX MICROSCOPIC
Bilirubin, UA: NEGATIVE
Glucose, UA: NEGATIVE
Ketones, UA: NEGATIVE
Leukocytes,UA: NEGATIVE
Nitrite, UA: NEGATIVE
Protein,UA: NEGATIVE
RBC, UA: NEGATIVE
Specific Gravity, UA: 1.02 (ref 1.005–1.030)
Urobilinogen, Ur: 1 mg/dL (ref 0.2–1.0)
pH, UA: 6.5 (ref 5.0–7.5)

## 2021-03-14 LAB — MICROALBUMIN, URINE WAIVED
Creatinine, Urine Waived: 100 mg/dL (ref 10–300)
Microalb, Ur Waived: 10 mg/L (ref 0–19)
Microalb/Creat Ratio: <30 mg/g (ref <30)

## 2021-03-14 LAB — BAYER DCA HB A1C WAIVED: HB A1C (BAYER DCA - WAIVED): 7.1 % — ABNORMAL HIGH (ref 4.8–5.6)

## 2021-03-14 MED ORDER — BENAZEPRIL HCL 40 MG PO TABS: 40.0000 mg | ORAL_TABLET | Freq: Every day | ORAL | 2 refills | Status: DC

## 2021-03-14 MED ORDER — METFORMIN HCL 500 MG PO TABS: 1000.0000 mg | ORAL_TABLET | Freq: Two times a day (BID) | ORAL | 1 refills | Status: DC

## 2021-03-14 MED ORDER — OZEMPIC (1 MG/DOSE) 2 MG/1.5ML ~~LOC~~ SOPN: 1.0000 mg | PEN_INJECTOR | SUBCUTANEOUS | 1 refills | Status: DC

## 2021-03-14 MED ORDER — ATORVASTATIN CALCIUM 20 MG PO TABS: 20.0000 mg | ORAL_TABLET | Freq: Every day | ORAL | 2 refills | Status: DC

## 2021-03-14 NOTE — Assessment & Plan Note (Signed)
Encouraged diet and exercise with goal of losing 1-2 lbs per week. Continue to monitor.  

## 2021-03-14 NOTE — Progress Notes (Signed)
BP 140/84   Pulse 68   Ht 6\' 1"  (1.854 m)   Wt (!) 307 lb (139.3 kg)   BMI 40.50 kg/m    Subjective:    Patient ID: Cody Burke, male    DOB: March 03, 1966, 55 y.o.   MRN: [id number]  HPI: Cody Burke is a 55 y.o. male presenting on 03/14/2021 for comprehensive medical examination. Current medical complaints include:  DIABETES Hypoglycemic episodes:no Polydipsia/polyuria: no Visual disturbance: no Chest pain: no Paresthesias: no Glucose Monitoring: yes  Accucheck frequency: Daily  Fasting glucose: 100-180 Taking Insulin?: no Blood Pressure Monitoring: not checking Retinal Examination: Up to Date Foot Exam: Up to Date Diabetic Education: Completed Pneumovax: Up to Date Influenza: Up to Date Aspirin: yes  HYPERTENSION / HYPERLIPIDEMIA Satisfied with current treatment? yes Duration of hypertension: chronic BP monitoring frequency: not checking BP medication side effects: no Past BP meds: benazepri Duration of hyperlipidemia: chronic Cholesterol medication side effects: no Cholesterol supplements: none Past cholesterol medications: atorvastatin Medication compliance: excellent compliance Aspirin: yes Recent stressors: no Recurrent headaches: no Visual changes: no Palpitations: no Dyspnea: no Chest pain: no Lower extremity edema: no Dizzy/lightheaded: no  He currently lives with: wife Interim Problems from his last visit: no  Depression Screen done today and results listed below:  Depression screen [facility] 2/9 03/14/2021 03/09/2020 03/08/2019 01/05/2018 09/02/2017  Decreased Interest 0 0 1 0 0  Down, Depressed, Hopeless 0 0 1 0 0  PHQ - 2 Score 0 0 2 0 0  Altered sleeping 1 0 0 0 -  Tired, decreased energy 0 0 0 0 -  Change in appetite 0 0 0 0 -  Feeling bad or failure about yourself  0 0 0 0 -  Trouble concentrating 0 0 0 0 -  Moving slowly or fidgety/restless 0 0 0 0 -  Suicidal thoughts 0 0 0 0 -  PHQ-9 Score 1 0 2 0 -  Difficult doing work/chores - Not  difficult at all Not difficult at all - -    Past Medical History:  Past Medical History:  Diagnosis Date   Diabetes mellitus without complication ([place])    Hyperlipidemia    Hypertension    Peripheral vascular disease ([place])    Sleep apnea    severe - new DX - has not gotten CPAP yet    Surgical History:  Past Surgical History:  Procedure Laterality Date   COLONOSCOPY WITH PROPOFOL N/A 09/21/2015   Procedure: COLONOSCOPY WITH PROPOFOL;  Surgeon: Lucilla Lame, MD;  Location: [facility];  Service: Endoscopy;  Laterality: N/A;  Diabetic - oral meds sleep apnea   VASCULAR SURGERY Bilateral    legs    Medications:  Current Outpatient Medications on File Prior to Visit  Medication Sig   aspirin EC 81 MG tablet Take 81 mg by mouth daily.   fluticasone (FLONASE) 50 MCG/ACT nasal spray Place into both nostrils.   glucose blood test strip Please dispense insurance preference. Use to check BS BID   Insulin Pen Needle (PEN NEEDLES) 31G X 8 MM MISC 1 each by Does not apply route once a week.   Multiple Vitamins-Minerals (MULTIVITAMIN GUMMIES ADULT PO) Take by mouth daily.   Omega-3 Fatty Acids (FISH OIL) 1000 MG CAPS Take 1 mg by mouth daily.   No current facility-administered medications on file prior to visit.    Allergies:  No Known Allergies  Social History:  Social History   Socioeconomic History   Marital status: Unknown    Spouse  name: Not on file   Number of children: Not on file   Years of education: Not on file   Highest education level: Not on file  Occupational History   Not on file  Tobacco Use   Smoking status: Former    Types: Cigarettes    Quit date: 07/30/2003    Years since quitting: 17.6   Smokeless tobacco: Never  Vaping Use   Vaping Use: Never used  Substance and Sexual Activity   Alcohol use: Yes    Comment: very little   Drug use: No   Sexual activity: Not on file  Other Topics Concern   Not on file  Social History Narrative   Not on  file   Social Determinants of Health   Financial Resource Strain: Not on file  Food Insecurity: Not on file  Transportation Needs: Not on file  Physical Activity: Not on file  Stress: Not on file  Social Connections: Not on file  Intimate Partner Violence: Not on file   Social History   Tobacco Use  Smoking Status Former   Types: Cigarettes   Quit date: 07/30/2003   Years since quitting: 17.6  Smokeless Tobacco Never   Social History   Substance and Sexual Activity  Alcohol Use Yes   Comment: very little    Family History:  Family History  Problem Relation Age of Onset   Cancer Mother    Hypertension Father     Past medical history, surgical history, medications, allergies, family history and social history reviewed with patient today and changes made to appropriate areas of the chart.   Review of Systems  Constitutional: Negative.   HENT: Negative.    Eyes: Negative.   Respiratory: Negative.    Cardiovascular: Negative.   Gastrointestinal:  Positive for heartburn (a little bit lately). Negative for abdominal pain, blood in stool, constipation, diarrhea, melena, nausea and vomiting.  Genitourinary: Negative.   Musculoskeletal: Negative.   Skin: Negative.   All other ROS negative except what is listed above and in the HPI.      Objective:    BP 140/84   Pulse 68   Ht 6\' 1"  (1.854 m)   Wt (!) 307 lb (139.3 kg)   BMI 40.50 kg/m   Wt Readings from Last 3 Encounters:  03/14/21 (!) 307 lb (139.3 kg)  09/13/20 (!) 307 lb (139.3 kg)  03/09/20 300 lb (136.1 kg)    Physical Exam  Results for orders placed or performed in visit on 03/14/21  Bayer DCA Hb A1c Waived (STAT)  Result Value Ref Range   HB A1C (BAYER DCA - WAIVED) 7.1 (H) 4.8 - 5.6 %  Urinalysis, Routine w reflex microscopic  Result Value Ref Range   Specific Gravity, UA 1.020 1.005 - 1.030   pH, UA 6.5 5.0 - 7.5   Color, UA Yellow Yellow   Appearance Ur Clear Clear   Leukocytes,UA Negative  Negative   Protein,UA Negative Negative/Trace   Glucose, UA Negative Negative   Ketones, UA Negative Negative   RBC, UA Negative Negative   Bilirubin, UA Negative Negative   Urobilinogen, Ur 1.0 0.2 - 1.0 mg/dL   Nitrite, UA Negative Negative  Microalbumin, Urine Waived  Result Value Ref Range   Microalb, Ur Waived 10 0 - 19 mg/L   Creatinine, Urine Waived 100 10 - 300 mg/dL   Microalb/Creat Ratio <30 <30 mg/g      Assessment & Plan:   Problem List Items Addressed This Visit  Cardiovascular and Mediastinum   Hypertension    Under good control on current regimen. Continue current regimen. Continue to monitor. Call with any concerns. Refills given. Labs drawn today.       Relevant Medications   atorvastatin (LIPITOR) 20 MG tablet   benazepril (LOTENSIN) 40 MG tablet   Other Relevant Orders   Bayer DCA Hb A1c Waived (STAT) (Completed)   Lipid Profile   Comprehensive metabolic panel   CBC w/Diff   TSH     Endocrine   Diabetes mellitus associated with hormonal etiology ([place])    Doing well with a1c of 7.1- up slightly from last time. Work on diet and exercise. Continue current regimen. Continue to monitor.       Relevant Medications   atorvastatin (LIPITOR) 20 MG tablet   benazepril (LOTENSIN) 40 MG tablet   metFORMIN (GLUCOPHAGE) 500 MG tablet   Semaglutide, 1 MG/DOSE, (OZEMPIC, 1 MG/DOSE,) 2 MG/1.5ML SOPN   Other Relevant Orders   Bayer DCA Hb A1c Waived (STAT) (Completed)   Lipid Profile   Comprehensive metabolic panel   CBC w/Diff   Urinalysis, Routine w reflex microscopic (Completed)   Microalbumin, Urine Waived (Completed)     Other   Hyperlipidemia    Under good control on current regimen. Continue current regimen. Continue to monitor. Call with any concerns. Refills given. Labs drawn today.        Relevant Medications   atorvastatin (LIPITOR) 20 MG tablet   benazepril (LOTENSIN) 40 MG tablet   Other Relevant Orders   Bayer DCA Hb A1c Waived  (STAT) (Completed)   Lipid Profile   Comprehensive metabolic panel   CBC w/Diff   Morbid obesity (HCC)    Encouraged diet and exercise with goal of losing 1-2lbs per week. Continue to monitor.       Relevant Medications   metFORMIN (GLUCOPHAGE) 500 MG tablet   Semaglutide, 1 MG/DOSE, (OZEMPIC, 1 MG/DOSE,) 2 MG/1.5ML SOPN   Elevated hemoglobin (HCC)    Recheck labs today. Await results.       Other Visit Diagnoses     Routine general medical examination at a health care facility    -  Primary   Vaccines up to date. Screening labs checked today. Colonoscopy up to date. Continue diet and exercise. Call with any concerns. Continue to monitor.   Relevant Orders   Bayer DCA Hb A1c Waived (STAT) (Completed)   Lipid Profile   Comprehensive metabolic panel   CBC w/Diff   Essential hypertension       Relevant Medications   atorvastatin (LIPITOR) 20 MG tablet   benazepril (LOTENSIN) 40 MG tablet   Other Relevant Orders   Bayer DCA Hb A1c Waived (STAT) (Completed)   Lipid Profile   Comprehensive metabolic panel   CBC w/Diff   Screening for prostate cancer       Labs drawn today. Await results. Treat as needed.    Relevant Orders   PSA   Needs flu shot       Flu shot given today.   Relevant Orders   Flu Vaccine QUAD 6+ mos PF IM (Fluarix Quad PF)        Discussed aspirin prophylaxis for myocardial infarction prevention and decision was made to continue ASA  LABORATORY TESTING:  Health maintenance labs ordered today as discussed above.   The natural history of prostate cancer and ongoing controversy regarding screening and potential treatment outcomes of prostate cancer has been discussed with the patient. The meaning of a  false positive PSA and a false negative PSA has been discussed. He indicates understanding of the limitations of this screening test and wishes to proceed with screening PSA testing.   IMMUNIZATIONS:   - Tdap: Tetanus vaccination status reviewed: last  tetanus booster within 10 years. - Influenza: Administered today - Pneumovax: Up to date - Prevnar: Not applicable - COVID: Up to date - Shingrix vaccine: Given elsewhere  SCREENING: - Colonoscopy: up to date  Discussed with patient purpose of the colonoscopy is to detect colon cancer at curable precancerous or early stages   PATIENT COUNSELING:    Sexuality: Discussed sexually transmitted diseases, partner selection, use of condoms, avoidance of unintended pregnancy  and contraceptive alternatives.   Advised to avoid cigarette smoking.  I discussed with the patient that most people either abstain from alcohol or drink within safe limits (<=14/week and <=4 drinks/occasion for males, <=7/weeks and <= 3 drinks/occasion for females) and that the risk for alcohol disorders and other health effects rises proportionally with the number of drinks per week and how often a drinker exceeds daily limits.  Discussed cessation/primary prevention of drug use and availability of treatment for abuse.   Diet: Encouraged to adjust caloric intake to maintain  or achieve ideal body weight, to reduce intake of dietary saturated fat and total fat, to limit sodium intake by avoiding high sodium foods and not adding table salt, and to maintain adequate dietary potassium and calcium preferably from fresh fruits, vegetables, and low-fat dairy products.    stressed the importance of regular exercise  Injury prevention: Discussed safety belts, safety helmets, smoke detector, smoking near bedding or upholstery.   Dental health: Discussed importance of regular tooth brushing, flossing, and dental visits.   Follow up plan: NEXT PREVENTATIVE PHYSICAL DUE IN 1 YEAR. Return in about 6 months (around 09/12/2021).

## 2021-03-14 NOTE — Assessment & Plan Note (Signed)
Recheck labs today. Await results.

## 2021-03-14 NOTE — Assessment & Plan Note (Signed)
Under good control on current regimen. Continue current regimen. Continue to monitor. Call with any concerns. Refills given. Labs drawn today.   

## 2021-03-14 NOTE — Assessment & Plan Note (Signed)
Doing well with a1c of 7.1- up slightly from last time. Work on diet and exercise. Continue current regimen. Continue to monitor.

## 2021-03-15 LAB — LIPID PANEL
Chol/HDL Ratio: 2.8 ratio (ref 0.0–5.0)
Cholesterol, Total: 120 mg/dL (ref 100–199)
HDL: 43 mg/dL (ref >39)
LDL Chol Calc (NIH): 51 mg/dL (ref 0–99)
Triglycerides: 149 mg/dL (ref 0–149)
VLDL Cholesterol Cal: 26 mg/dL (ref 5–40)

## 2021-03-15 LAB — CBC WITH DIFFERENTIAL/PLATELET
Basophils Absolute: 0.1 10*3/uL (ref 0.0–0.2)
Basos: 1 %
EOS (ABSOLUTE): 0.4 10*3/uL (ref 0.0–0.4)
Eos: 4 %
Hematocrit: 51.2 % — ABNORMAL HIGH (ref 37.5–51.0)
Hemoglobin: 16.6 g/dL (ref 13.0–17.7)
Immature Grans (Abs): 0.1 10*3/uL (ref 0.0–0.1)
Immature Granulocytes: 1 %
Lymphocytes Absolute: 4.3 10*3/uL — ABNORMAL HIGH (ref 0.7–3.1)
Lymphs: 43 %
MCH: 29.7 pg (ref 26.6–33.0)
MCHC: 32.4 g/dL (ref 31.5–35.7)
MCV: 92 fL (ref 79–97)
Monocytes Absolute: 1 10*3/uL — ABNORMAL HIGH (ref 0.1–0.9)
Monocytes: 9 %
Neutrophils Absolute: 4.4 10*3/uL (ref 1.4–7.0)
Neutrophils: 42 %
Platelets: 246 10*3/uL (ref 150–450)
RBC: 5.59 x10E6/uL (ref 4.14–5.80)
RDW: 12.9 % (ref 11.6–15.4)
WBC: 10.3 10*3/uL (ref 3.4–10.8)

## 2021-03-15 LAB — TSH: TSH: 2.33 u[IU]/mL (ref 0.450–4.500)

## 2021-03-15 LAB — COMPREHENSIVE METABOLIC PANEL
ALT: 43 IU/L (ref 0–44)
AST: 35 IU/L (ref 0–40)
Albumin/Globulin Ratio: 1.5 (ref 1.2–2.2)
Albumin: 4 g/dL (ref 3.8–4.9)
Alkaline Phosphatase: 90 IU/L (ref 44–121)
BUN/Creatinine Ratio: 13 (ref 9–20)
BUN: 12 mg/dL (ref 6–24)
Bilirubin Total: 0.4 mg/dL (ref 0.0–1.2)
CO2: 25 mmol/L (ref 20–29)
Calcium: 8.8 mg/dL (ref 8.7–10.2)
Chloride: 100 mmol/L (ref 96–106)
Creatinine, Ser: 0.91 mg/dL (ref 0.76–1.27)
Globulin, Total: 2.6 g/dL (ref 1.5–4.5)
Glucose: 149 mg/dL — ABNORMAL HIGH (ref 70–99)
Potassium: 4.7 mmol/L (ref 3.5–5.2)
Sodium: 137 mmol/L (ref 134–144)
Total Protein: 6.6 g/dL (ref 6.0–8.5)
eGFR: 99 mL/min/{1.73_m2} (ref >59)

## 2021-03-15 LAB — PSA: Prostate Specific Ag, Serum: <0.1 ng/mL (ref 0.0–4.0)

## 2021-05-01 ENCOUNTER — Ambulatory Visit: Payer: Self-pay | Admitting: *Deleted

## 2021-05-01 NOTE — Telephone Encounter (Signed)
Per agent: "Pts called in stating pt has tested positive for covid and wanted to speak with someone about what to do next, please advise."         Reason for Disposition  [1] HIGH RISK for severe COVID complications (e.g., weak immune system, age > 24 years, obesity with BMI > 25, pregnant, chronic lung disease or other chronic medical condition) AND [2] COVID symptoms (e.g., cough, fever)  (Exceptions: Already seen by PCP and no new or worsening symptoms.)  Answer Assessment - Initial Assessment Questions 1. COVID-19 DIAGNOSIS: "Who made your COVID-19 diagnosis?" "Was it confirmed by a positive lab test or self-test?" If not diagnosed by a doctor (or NP/PA), ask "Are there lots of cases (community spread) where you live?" Note: See public health department website, if unsure.     Home test one hour ago. 2. COVID-19 EXPOSURE: "Was there any known exposure to COVID before the symptoms began?" CDC Definition of close contact: within 6 feet (2 meters) for a total of 15 minutes or more over a 24-hour period.      No 3. ONSET: "When did the COVID-19 symptoms start?"      Monday 4. WORST SYMPTOM: "What is your worst symptom?" (e.g., cough, fever, shortness of breath, muscle aches)     *No Answer* 5. COUGH: "Do you have a cough?" If Yes, ask: "How bad is the cough?"       Yes, occasionally cough up phlegm, unsure of color 6. FEVER: "Do you have a fever?" If Yes, ask: "What is your temperature, how was it measured, and when did it start?"     Chills last night, BS ok, afebrile 7. RESPIRATORY STATUS: "Describe your breathing?" (e.g., shortness of breath, wheezing, unable to speak)      No 8. BETTER-SAME-WORSE: "Are you getting better, staying the same or getting worse compared to yesterday?"  If getting worse, ask, "In what way?"     Coughing more 9. HIGH RISK DISEASE: "Do you have any chronic medical problems?" (e.g., asthma, heart or lung disease, weak immune system, obesity, etc.)      DM 10. VACCINE: "Have you had the COVID-19 vaccine?" If Yes, ask: "Which one, how many shots, when did you get it?"       2 Moderna 11. BOOSTER: "Have you received your COVID-19 booster?" If Yes, ask: "Which one and when did you get it?"       2 boosters Jan 2022  13. OTHER SYMPTOMS: "Do you have any other symptoms?"  (e.g., chills, fatigue, headache, loss of smell or taste, muscle pain, sore throat)       Mild sore throat from coughing, mild headache 14. O2 SATURATION MONITOR:  "Do you use an oxygen saturation monitor (pulse oximeter) at home?" If Yes, ask "What is your reading (oxygen level) today?" "What is your usual oxygen saturation reading?" (e.g., 95%)       NA  Protocols used: Coronavirus (COVID-19) Diagnosed or Suspected-A-AH

## 2021-05-01 NOTE — Telephone Encounter (Signed)
Pt did covid home test today, positive. Reports cough occasionally productive for phlegm has not noted color. Reports mild sore throat, mild headache. Denies fever, did have chills last night, states BS ok and no fever. Reports mild sore throat from coughing. Denies any SOB, no wheezing. Symptoms onset Monday. Home care advise given as well as guidelines for self isolation. Reviewed symptoms that warrant ED eval. Pt verbalizes understanding. Pt states he is interested in oral anti-viral if Dr. Wynetta Emery thinks appropriate. Pt with history of DM, obesity.  Please advise:  [phone]

## 2021-05-02 ENCOUNTER — Encounter: Payer: Self-pay | Admitting: Nurse Practitioner

## 2021-05-02 ENCOUNTER — Telehealth: Payer: No Typology Code available for payment source | Admitting: Nurse Practitioner

## 2021-05-02 DIAGNOSIS — U071 COVID-19: Secondary | ICD-10-CM | POA: Diagnosis not present

## 2021-05-02 MED ORDER — MOLNUPIRAVIR EUA 200MG CAPSULE: 4.0000 | ORAL_CAPSULE | Freq: Two times a day (BID) | ORAL | 0 refills | Status: AC

## 2021-05-02 NOTE — Progress Notes (Signed)
There were no vitals taken for this visit.   Subjective:    Patient ID: Cody Burke, male    DOB: 08/04/1965, 55 y.o.   MRN: [id number]  HPI: Cody Burke is a 55 y.o. male  Chief Complaint  Patient presents with   Covid Positive    Patient states he tested positive with at-home test. Patient states he became symptomatic Monday with sinus issues and then it started to move on down into patient's throat. Patient states he has tried OTC medications and Tylenol/Ibuprofen. Patient states he feels about the same nothings changed.    This visit was completed via video visit through MyChart due to the restrictions of the COVID-19 pandemic. All issues as above were discussed and addressed. Physical exam was done as above through visual confirmation on video through MyChart. If it was felt that the patient should be evaluated in the office, they were directed there. The patient verbally consented to this visit. Location of the patient: home Location of the provider: work Those involved with this call:  Provider: Marnee Guarneri, DNP CMA: Irena Reichmann, [place] Desk/Registration: [employer]  Time spent on call:  21 minutes with patient face to face via video conference. More than 50% of this time was spent in counseling and coordination of care. 15 minutes total spent in review of patient's record and preparation of their chart. I verified patient identity using two factors (patient name and date of birth). Patient consents verbally to being seen via telemedicine visit today.     COVID POSITIVE Tested positive for Covid yesterday on home test, with symptoms starting Monday.  Started with head cold and progressed into chest.  Has Covid vaccinations, with last at beginning of this year. Fever: no Cough: yes Shortness of breath: no Wheezing: no Chest pain: no Chest tightness: no Chest congestion: no Nasal congestion: yes Runny nose: yes Post nasal drip: yes Sneezing: no Sore throat:  yes Swollen glands: no Sinus pressure: yes Headache: yes Face pain: yes Toothache: no Ear pain: none Ear pressure: none Eyes red/itching:no Eye drainage/crusting: no  Vomiting: no Rash: no Fatigue: yes Sick contacts: no Strep contacts: no  Context: stable Recurrent sinusitis: no Relief with OTC cold/cough medications: yes  Treatments attempted: cold/sinus Coricidin    Relevant past medical, surgical, family and social history reviewed and updated as indicated. Interim medical history since our last visit reviewed. Allergies and medications reviewed and updated.  Review of Systems  Constitutional:  Positive for fatigue. Negative for activity change, appetite change, chills, diaphoresis and fever.  HENT:  Positive for congestion, postnasal drip, rhinorrhea, sinus pressure and sore throat. Negative for ear discharge, ear pain, sinus pain, sneezing and voice change.   Respiratory:  Positive for cough. Negative for chest tightness, shortness of breath and wheezing.   Cardiovascular: Negative.   Gastrointestinal: Negative.   Neurological:  Positive for headaches. Negative for dizziness, weakness and numbness.  Psychiatric/Behavioral: Negative.     Per HPI unless specifically indicated above     Objective:    There were no vitals taken for this visit.  Wt Readings from Last 3 Encounters:  03/14/21 (!) 307 lb (139.3 kg)  09/13/20 (!) 307 lb (139.3 kg)  03/09/20 300 lb (136.1 kg)    Physical Exam Vitals and nursing note reviewed.  Constitutional:      General: He is awake. He is not in acute distress.    Appearance: He is well-developed and well-groomed. He is obese. He is not ill-appearing or  toxic-appearing.  HENT:     Head: Normocephalic.     Right Ear: Hearing normal. No drainage.     Left Ear: Hearing normal. No drainage.  Eyes:     General: Lids are normal.        Right eye: No discharge.        Left eye: No discharge.     Conjunctiva/sclera: Conjunctivae normal.   Pulmonary:     Effort: Pulmonary effort is normal. No accessory muscle usage or respiratory distress.  Musculoskeletal:     Cervical back: Normal range of motion.  Neurological:     Mental Status: He is alert and oriented to person, place, and time.  Psychiatric:        Mood and Affect: Mood normal.        Behavior: Behavior normal. Behavior is cooperative.        Thought Content: Thought content normal.        Judgment: Judgment normal.   Results for orders placed or performed in visit on 03/14/21  Lipid Profile  Result Value Ref Range   Cholesterol, Total 120 100 - 199 mg/dL   Triglycerides 149 0 - 149 mg/dL   HDL 43 >39 mg/dL   VLDL Cholesterol Cal 26 5 - 40 mg/dL   LDL Chol Calc (NIH) 51 0 - 99 mg/dL   Chol/HDL Ratio 2.8 0.0 - 5.0 ratio  Bayer DCA Hb A1c Waived (STAT)  Result Value Ref Range   HB A1C (BAYER DCA - WAIVED) 7.1 (H) 4.8 - 5.6 %  Urinalysis, Routine w reflex microscopic  Result Value Ref Range   Specific Gravity, UA 1.020 1.005 - 1.030   pH, UA 6.5 5.0 - 7.5   Color, UA Yellow Yellow   Appearance Ur Clear Clear   Leukocytes,UA Negative Negative   Protein,UA Negative Negative/Trace   Glucose, UA Negative Negative   Ketones, UA Negative Negative   RBC, UA Negative Negative   Bilirubin, UA Negative Negative   Urobilinogen, Ur 1.0 0.2 - 1.0 mg/dL   Nitrite, UA Negative Negative  Microalbumin, Urine Waived  Result Value Ref Range   Microalb, Ur Waived 10 0 - 19 mg/L   Creatinine, Urine Waived 100 10 - 300 mg/dL   Microalb/Creat Ratio <30 <30 mg/g  CBC with Differential/Platelet  Result Value Ref Range   WBC 10.3 3.4 - 10.8 x10E3/uL   RBC 5.59 4.14 - 5.80 x10E6/uL   Hemoglobin 16.6 13.0 - 17.7 g/dL   Hematocrit 51.2 (H) 37.5 - 51.0 %   MCV 92 79 - 97 fL   MCH 29.7 26.6 - 33.0 pg   MCHC 32.4 31.5 - 35.7 g/dL   RDW 12.9 11.6 - 15.4 %   Platelets 246 150 - 450 x10E3/uL   Neutrophils 42 Not Estab. %   Lymphs 43 Not Estab. %   Monocytes 9 Not Estab. %    Eos 4 Not Estab. %   Basos 1 Not Estab. %   Neutrophils Absolute 4.4 1.4 - 7.0 x10E3/uL   Lymphocytes Absolute 4.3 (H) 0.7 - 3.1 x10E3/uL   Monocytes Absolute 1.0 (H) 0.1 - 0.9 x10E3/uL   EOS (ABSOLUTE) 0.4 0.0 - 0.4 x10E3/uL   Basophils Absolute 0.1 0.0 - 0.2 x10E3/uL   Immature Granulocytes 1 Not Estab. %   Immature Grans (Abs) 0.1 0.0 - 0.1 x10E3/uL  Comprehensive metabolic panel  Result Value Ref Range   Glucose 149 (H) 70 - 99 mg/dL   BUN 12 6 - 24 mg/dL  Creatinine, Ser 0.91 0.76 - 1.27 mg/dL   eGFR 99 >59 mL/min/1.73   BUN/Creatinine Ratio 13 9 - 20   Sodium 137 134 - 144 mmol/L   Potassium 4.7 3.5 - 5.2 mmol/L   Chloride 100 96 - 106 mmol/L   CO2 25 20 - 29 mmol/L   Calcium 8.8 8.7 - 10.2 mg/dL   Total Protein 6.6 6.0 - 8.5 g/dL   Albumin 4.0 3.8 - 4.9 g/dL   Globulin, Total 2.6 1.5 - 4.5 g/dL   Albumin/Globulin Ratio 1.5 1.2 - 2.2   Bilirubin Total 0.4 0.0 - 1.2 mg/dL   Alkaline Phosphatase 90 44 - 121 IU/L   AST 35 0 - 40 IU/L   ALT 43 0 - 44 IU/L  TSH  Result Value Ref Range   TSH 2.330 0.450 - 4.500 uIU/mL  PSA  Result Value Ref Range   Prostate Specific Ag, Serum <0.1 0.0 - 4.0 ng/mL      Assessment & Plan:   Problem List Items Addressed This Visit       Other   Lab test positive for detection of COVID-19 virus    Acute with symptoms starting 04/29/21 (on day 4) and positive testing at home.  Discussed oral treatment options with patient.  At this time will start Sims, educated on the side effects of this and reason for treatment.  Self quarantine for 5 days and then mask for 5 days. Recommend: - Increased rest - Increasing Fluids - Acetaminophen / ibuprofen as needed for fever/pain.  - Salt water gargling, chloraseptic spray and throat lozenges. - Mucinex.  Return to office if worsening or ongoing symptoms.         I discussed the assessment and treatment plan with the patient. The patient was provided an opportunity to ask questions and  all were answered. The patient agreed with the plan and demonstrated an understanding of the instructions.   The patient was advised to call back or seek an in-person evaluation if the symptoms worsen or if the condition fails to improve as anticipated.   I provided 21+ minutes of time during this encounter.   Follow up plan: Return if symptoms worsen or fail to improve.

## 2021-05-02 NOTE — Assessment & Plan Note (Signed)
Acute with symptoms starting 04/29/21 (on day 4) and positive testing at home.  Discussed oral treatment options with patient.  At this time will start Harrison, educated on the side effects of this and reason for treatment.  Self quarantine for 5 days and then mask for 5 days. Recommend: - Increased rest - Increasing Fluids - Acetaminophen / ibuprofen as needed for fever/pain.  - Salt water gargling, chloraseptic spray and throat lozenges. - Mucinex.  Return to office if worsening or ongoing symptoms.

## 2021-05-02 NOTE — Patient Instructions (Signed)
COVID-19: Quarantine and Isolation °Quarantine °If you were exposed °Quarantine and stay away from others when you have been in close contact with someone who has COVID-19. °Isolate °If you are sick or test positive °Isolate when you are sick or when you have COVID-19, even if you don't have symptoms. °When to stay home °Calculating quarantine °The date of your exposure is considered day 0. Day 1 is the first full day after your last contact with a person who has had COVID-19. Stay home and away from other people for at least 5 days. Learn why CDC updated guidance for the general public. °IF YOU were exposed to COVID-19 and are NOT  °up to dateIF YOU were exposed to COVID-19 and are NOT on COVID-19 vaccinations °Quarantine for at least 5 days °Stay home °Stay home and quarantine for at least 5 full days. °Wear a well-fitting mask if you must be around others in your home. °Do not travel. °Get tested °Even if you don't develop symptoms, get tested at least 5 days after you last had close contact with someone with COVID-19. °After quarantine °Watch for symptoms °Watch for symptoms until 10 days after you last had close contact with someone with COVID-19. °Avoid travel °It is best to avoid travel until a full 10 days after you last had close contact with someone with COVID-19. °If you develop symptoms °Isolate immediately and get tested. Continue to stay home until you know the results. Wear a well-fitting mask around others. °Take precautions until day 10 °Wear a well-fitting mask °Wear a well-fitting mask for 10 full days any time you are around others inside your home or in public. Do not go to places where you are unable to wear a well-fitting mask. °If you must travel during days 6-10, take precautions. °Avoid being around people who are more likely to get very sick from COVID-19. °IF YOU were exposed to COVID-19 and are  °up to dateIF YOU were exposed to COVID-19 and are on COVID-19 vaccinations °No  quarantine °You do not need to stay home unless you develop symptoms. °Get tested °Even if you don't develop symptoms, get tested at least 5 days after you last had close contact with someone with COVID-19. °Watch for symptoms °Watch for symptoms until 10 days after you last had close contact with someone with COVID-19. °If you develop symptoms °Isolate immediately and get tested. Continue to stay home until you know the results. Wear a well-fitting mask around others. °Take precautions until day 10 °Wear a well-fitting mask °Wear a well-fitting mask for 10 full days any time you are around others inside your home or in public. Do not go to places where you are unable to wear a well-fitting mask. °Take precautions if traveling °Avoid being around people who are more likely to get very sick from COVID-19. °IF YOU were exposed to COVID-19 and had confirmed COVID-19 within the past 90 days (you tested positive using a viral test) °No quarantine °You do not need to stay home unless you develop symptoms. °Watch for symptoms °Watch for symptoms until 10 days after you last had close contact with someone with COVID-19. °If you develop symptoms °Isolate immediately and get tested. Continue to stay home until you know the results. Wear a well-fitting mask around others. °Take precautions until day 10 °Wear a well-fitting mask °Wear a well-fitting mask for 10 full days any time you are around others inside your home or in public. Do not go to places where you are   unable to wear a well-fitting mask. °Take precautions if traveling °Avoid being around people who are more likely to get very sick from COVID-19. °Calculating isolation °Day 0 is your first day of symptoms or a positive viral test. Day 1 is the first full day after your symptoms developed or your test specimen was collected. If you have COVID-19 or have symptoms, isolate for at least 5 days. °IF YOU tested positive for COVID-19 or have symptoms, regardless of  vaccination status °Stay home for at least 5 days °Stay home for 5 days and isolate from others in your home. °Wear a well-fitting mask if you must be around others in your home. °Do not travel. °Ending isolation if you had symptoms °End isolation after 5 full days if you are fever-free for 24 hours (without the use of fever-reducing medication) and your symptoms are improving. °Ending isolation if you did NOT have symptoms °End isolation after at least 5 full days after your positive test. °If you got very sick from COVID-19 or have a weakened immune system °You should isolate for at least 10 days. Consult your doctor before ending isolation. °Take precautions until day 10 °Wear a well-fitting mask °Wear a well-fitting mask for 10 full days any time you are around others inside your home or in public. Do not go to places where you are unable to wear a well-fitting mask. °Do not travel °Do not travel until a full 10 days after your symptoms started or the date your positive test was taken if you had no symptoms. °Avoid being around people who are more likely to get very sick from COVID-19. °Definitions °Exposure °Contact with someone infected with SARS-CoV-2, the virus that causes COVID-19, in a way that increases the likelihood of getting infected with the virus. °Close contact °A close contact is someone who was less than 6 feet away from an infected person (laboratory-confirmed or a clinical diagnosis) for a cumulative total of 15 minutes or more over a 24-hour period. For example, three individual 5-minute exposures for a total of 15 minutes. People who are exposed to someone with COVID-19 after they completed at least 5 days of isolation are not considered close contacts. °Quarantine °Quarantine is a strategy used to prevent transmission of COVID-19 by keeping people who have been in close contact with someone with COVID-19 apart from others. °Who does not need to quarantine? °If you had close contact with  someone with COVID-19 and you are in one of the following groups, you do not need to quarantine. °You are up to date with your COVID-19 vaccines. °You had confirmed COVID-19 within the last 90 days (meaning you tested positive using a viral test). °If you are up to date with COVID-19 vaccines, you should wear a well-fitting mask around others for 10 days from the date of your last close contact with someone with COVID-19 (the date of last close contact is considered day 0). Get tested at least 5 days after you last had close contact with someone with COVID-19. If you test positive or develop COVID-19 symptoms, isolate from other people and follow recommendations in the Isolation section below. If you tested positive for COVID-19 with a viral test within the previous 90 days and subsequently recovered and remain without COVID-19 symptoms, you do not need to quarantine or get tested after close contact. You should wear a well-fitting mask around others for 10 days from the date of your last close contact with someone with COVID-19 (the date of last   close contact is considered day 0). If you have COVID-19 symptoms, get tested and isolate from other people and follow recommendations in the Isolation section below. °Who should quarantine? °If you come into close contact with someone with COVID-19, you should quarantine if you are not up to date on COVID-19 vaccines. This includes people who are not vaccinated. °What to do for quarantine °Stay home and away from other people for at least 5 days (day 0 through day 5) after your last contact with a person who has COVID-19. The date of your exposure is considered day 0. Wear a well-fitting mask when around others at home, if possible. °For 10 days after your last close contact with someone with COVID-19, watch for fever (100.4°F or greater), cough, shortness of breath, or other COVID-19 symptoms. °If you develop symptoms, get tested immediately and isolate until you receive  your test results. If you test positive, follow isolation recommendations. °If you do not develop symptoms, get tested at least 5 days after you last had close contact with someone with COVID-19. °If you test negative, you can leave your home, but continue to wear a well-fitting mask when around others at home and in public until 10 days after your last close contact with someone with COVID-19. °If you test positive, you should isolate for at least 5 days from the date of your positive test (if you do not have symptoms). If you do develop COVID-19 symptoms, isolate for at least 5 days from the date your symptoms began (the date the symptoms started is day 0). Follow recommendations in the isolation section below. °If you are unable to get a test 5 days after last close contact with someone with COVID-19, you can leave your home after day 5 if you have been without COVID-19 symptoms throughout the 5-day period. Wear a well-fitting mask for 10 days after your date of last close contact when around others at home and in public. °Avoid people who are have weakened immune systems or are more likely to get very sick from COVID-19, and nursing homes and other high-risk settings, until after at least 10 days. °If possible, stay away from people you live with, especially people who are at higher risk for getting very sick from COVID-19, as well as others outside your home throughout the full 10 days after your last close contact with someone with COVID-19. °If you are unable to quarantine, you should wear a well-fitting mask for 10 days when around others at home and in public. °If you are unable to wear a mask when around others, you should continue to quarantine for 10 days. Avoid people who have weakened immune systems or are more likely to get very sick from COVID-19, and nursing homes and other high-risk settings, until after at least 10 days. °See additional information about travel. °Do not go to places where you are  unable to wear a mask, such as restaurants and some gyms, and avoid eating around others at home and at work until after 10 days after your last close contact with someone with COVID-19. °After quarantine °Watch for symptoms until 10 days after your last close contact with someone with COVID-19. °If you have symptoms, isolate immediately and get tested. °Quarantine in high-risk congregate settings °In certain congregate settings that have high risk of secondary transmission (such as correctional and detention facilities, homeless shelters, or cruise ships), CDC recommends a 10-day quarantine for residents, regardless of vaccination and booster status. During periods of critical staffing   shortages, facilities may consider shortening the quarantine period for staff to ensure continuity of operations. Decisions to shorten quarantine in these settings should be made in consultation with state, local, tribal, or territorial health departments and should take into consideration the context and characteristics of the facility. CDC's setting-specific guidance provides additional recommendations for these settings. °Isolation °Isolation is used to separate people with confirmed or suspected COVID-19 from those without COVID-19. People who are in isolation should stay home until it's safe for them to be around others. At home, anyone sick or infected should separate from others, or wear a well-fitting mask when they need to be around others. People in isolation should stay in a specific "sick room" or area and use a separate bathroom if available. Everyone who has presumed or confirmed COVID-19 should stay home and isolate from other people for at least 5 full days (day 0 is the first day of symptoms or the date of the day of the positive viral test for asymptomatic persons). They should wear a mask when around others at home and in public for an additional 5 days. People who are confirmed to have COVID-19 or are showing  symptoms of COVID-19 need to isolate regardless of their vaccination status. This includes: °People who have a positive viral test for COVID-19, regardless of whether or not they have symptoms. °People with symptoms of COVID-19, including people who are awaiting test results or have not been tested. People with symptoms should isolate even if they do not know if they have been in close contact with someone with COVID-19. °What to do for isolation °Monitor your symptoms. If you have an emergency warning sign (including trouble breathing), seek emergency medical care immediately. °Stay in a separate room from other household members, if possible. °Use a separate bathroom, if possible. °Take steps to improve ventilation at home, if possible. °Avoid contact with other members of the household and pets. °Don't share personal household items, like cups, towels, and utensils. °Wear a well-fitting mask when you need to be around other people. °Learn more about what to do if you are sick and how to notify your contacts. °Ending isolation for people who had COVID-19 and had symptoms °If you had COVID-19 and had symptoms, isolate for at least 5 days. To calculate your 5-day isolation period, day 0 is your first day of symptoms. Day 1 is the first full day after your symptoms developed. You can leave isolation after 5 full days. °You can end isolation after 5 full days if you are fever-free for 24 hours without the use of fever-reducing medication and your other symptoms have improved (Loss of taste and smell may persist for weeks or months after recovery and need not delay the end of isolation). °You should continue to wear a well-fitting mask around others at home and in public for 5 additional days (day 6 through day 10) after the end of your 5-day isolation period. If you are unable to wear a mask when around others, you should continue to isolate for a full 10 days. Avoid people who have weakened immune systems or are more  likely to get very sick from COVID-19, and nursing homes and other high-risk settings, until after at least 10 days. °If you continue to have fever or your other symptoms have not improved after 5 days of isolation, you should wait to end your isolation until you are fever-free for 24 hours without the use of fever-reducing medication and your other symptoms have improved.   Continue to wear a well-fitting mask through day 10. Contact your healthcare provider if you have questions. °See additional information about travel. °Do not go to places where you are unable to wear a mask, such as restaurants and some gyms, and avoid eating around others at home and at work until a full 10 days after your first day of symptoms. °If an individual has access to a test and wants to test, the best approach is to use an antigen test1 towards the end of the 5-day isolation period. Collect the test sample only if you are fever-free for 24 hours without the use of fever-reducing medication and your other symptoms have improved (loss of taste and smell may persist for weeks or months after recovery and need not delay the end of isolation). If your test result is positive, you should continue to isolate until day 10. If your test result is negative, you can end isolation, but continue to wear a well-fitting mask around others at home and in public until day 10. Follow additional recommendations for masking and avoiding travel as described above. °1As noted in the labeling for authorized over-the counter antigen tests: Negative results should be treated as presumptive. Negative results do not rule out SARS-CoV-2 infection and should not be used as the sole basis for treatment or patient management decisions, including infection control decisions. To improve results, antigen tests should be used twice over a three-day period with at least 24 hours and no more than 48 hours between tests. °Note that these recommendations on ending isolation  do not apply to people who are moderately ill or very sick from COVID-19 or have weakened immune systems. See section below for recommendations for when to end isolation for these groups. °Ending isolation for people who tested positive for COVID-19 but had no symptoms °If you test positive for COVID-19 and never develop symptoms, isolate for at least 5 days. Day 0 is the day of your positive viral test (based on the date you were tested) and day 1 is the first full day after the specimen was collected for your positive test. You can leave isolation after 5 full days. °If you continue to have no symptoms, you can end isolation after at least 5 days. °You should continue to wear a well-fitting mask around others at home and in public until day 10 (day 6 through day 10). If you are unable to wear a mask when around others, you should continue to isolate for 10 days. Avoid people who have weakened immune systems or are more likely to get very sick from COVID-19, and nursing homes and other high-risk settings, until after at least 10 days. °If you develop symptoms after testing positive, your 5-day isolation period should start over. Day 0 is your first day of symptoms. Follow the recommendations above for ending isolation for people who had COVID-19 and had symptoms. °See additional information about travel. °Do not go to places where you are unable to wear a mask, such as restaurants and some gyms, and avoid eating around others at home and at work until 10 days after the day of your positive test. °If an individual has access to a test and wants to test, the best approach is to use an antigen test1 towards the end of the 5-day isolation period. If your test result is positive, you should continue to isolate until day 10. If your test result is positive, you can also choose to test daily and if your test result   is negative, you can end isolation, but continue to wear a well-fitting mask around others at home and in  public until day 10. Follow additional recommendations for masking and avoiding travel as described above. °1As noted in the labeling for authorized over-the counter antigen tests: Negative results should be treated as presumptive. Negative results do not rule out SARS-CoV-2 infection and should not be used as the sole basis for treatment or patient management decisions, including infection control decisions. To improve results, antigen tests should be used twice over a three-day period with at least 24 hours and no more than 48 hours between tests. °Ending isolation for people who were moderately or very sick from COVID-19 or have a weakened immune system °People who are moderately ill from COVID-19 (experiencing symptoms that affect the lungs like shortness of breath or difficulty breathing) should isolate for 10 days and follow all other isolation precautions. To calculate your 10-day isolation period, day 0 is your first day of symptoms. Day 1 is the first full day after your symptoms developed. If you are unsure if your symptoms are moderate, talk to a healthcare provider for further guidance. °People who are very sick from COVID-19 (this means people who were hospitalized or required intensive care or ventilation support) and people who have weakened immune systems might need to isolate at home longer. They may also require testing with a viral test to determine when they can be around others. CDC recommends an isolation period of at least 10 and up to 20 days for people who were very sick from COVID-19 and for people with weakened immune systems. Consult with your healthcare provider about when you can resume being around other people. If you are unsure if your symptoms are severe or if you have a weakened immune system, talk to a healthcare provider for further guidance. °People who have a weakened immune system should talk to their healthcare provider about the potential for reduced immune responses to  COVID-19 vaccines and the need to continue to follow current prevention measures (including wearing a well-fitting mask and avoiding crowds and poorly ventilated indoor spaces) to protect themselves against COVID-19 until advised otherwise by their healthcare provider. Close contacts of immunocompromised people--including household members--should also be encouraged to receive all recommended COVID-19 vaccine doses to help protect these people. °Isolation in high-risk congregate settings °In certain high-risk congregate settings that have high risk of secondary transmission and where it is not feasible to cohort people (such as correctional and detention facilities, homeless shelters, and cruise ships), CDC recommends a 10-day isolation period for residents. During periods of critical staffing shortages, facilities may consider shortening the isolation period for staff to ensure continuity of operations. Decisions to shorten isolation in these settings should be made in consultation with state, local, tribal, or territorial health departments and should take into consideration the context and characteristics of the facility. CDC's setting-specific guidance provides additional recommendations for these settings. °This CDC guidance is meant to supplement--not replace--any federal, state, local, territorial, or tribal health and safety laws, rules, and regulations. °Recommendations for specific settings °These recommendations do not apply to healthcare professionals. For guidance specific to these settings, see °Healthcare professionals: Interim Guidance for Managing Healthcare Personnel with SARS-CoV-2 Infection or Exposure to SARS-CoV-2 °Patients, residents, and visitors to healthcare settings: Interim Infection Prevention and Control Recommendations for Healthcare Personnel During the Coronavirus Disease 2019 (COVID-19) Pandemic °Additional setting-specific guidance and recommendations are available. °These  recommendations on quarantine and isolation do apply to K[employer]12 School   settings. Additional guidance is available here: Overview of COVID-19 Quarantine for K-12 Schools °Travelers: Travel information and recommendations °Congregate facilities and other settings: guidance pages for community, work, and school settings °Ongoing COVID-19 exposure FAQs °I live with someone with COVID-19, but I cannot be separated from them. How do we manage quarantine in this situation? °It is very important for people with COVID-19 to remain apart from other people, if possible, even if they are living together. If separation of the person with COVID-19 from others that they live with is not possible, the other people that they live with will have ongoing exposure, meaning they will be repeatedly exposed until that person is no longer able to spread the virus to other people. In this situation, there are precautions you can take to limit the spread of COVID-19: °The person with COVID-19 and everyone they live with should wear a well-fitting mask inside the home. °If possible, one person should care for the person with COVID-19 to limit the number of people who are in close contact with the infected person. °Take steps to protect yourself and others to reduce transmission in the home: °Quarantine if you are not up to date with your COVID-19 vaccines. °Isolate if you are sick or tested positive for COVID-19, even if you don't have symptoms. °Learn more about the public health recommendations for testing, mask use and quarantine of close contacts, like yourself, who have ongoing exposure. These recommendations differ depending on your vaccination status. °What should I do if I have ongoing exposure to COVID-19 from someone I live with? °Recommendations for this situation depend on your vaccination status: °If you are not up to date on COVID-19 vaccines and have ongoing exposure to COVID-19, you should: °Begin quarantine immediately and  continue to quarantine throughout the isolation period of the person with COVID-19. °Continue to quarantine for an additional 5 days starting the day after the end of isolation for the person with COVID-19. °Get tested at least 5 days after the end of isolation of the infected person that lives with them. °If you test negative, you can leave the home but should continue to wear a well-fitting mask when around others at home and in public until 10 days after the end of isolation for the person with COVID-19. °Isolate immediately if you develop symptoms of COVID-19 or test positive. °If you are up to date with COVID-19 vaccines and have ongoing exposure to COVID-19, you should: °Get tested at least 5 days after your first exposure. A person with COVID-19 is considered infectious starting 2 days before they develop symptoms, or 2 days before the date of their positive test if they do not have symptoms. °Get tested again at least 5 days after the end of isolation for the person with COVID-19. °Wear a well-fitting mask when you are around the person with COVID-19, and do this throughout their isolation period. °Wear a well-fitting mask around others for 10 days after the infected person's isolation period ends. °Isolate immediately if you develop symptoms of COVID-19 or test positive. °What should I do if multiple people I live with test positive for COVID-19 at different times? °Recommendations for this situation depend on your vaccination status: °If you are not up to date with your COVID-19 vaccines, you should: °Quarantine throughout the isolation period of any infected person that you live with. °Continue to quarantine until 5 days after the end of isolation date for the most recently infected person that lives with you. For example, if   the last day of isolation of the person most recently infected with COVID-19 was June 30, the new 5-day quarantine period starts on July 1. °Get tested at least 5 days after the end  of isolation for the most recently infected person that lives with you. °Wear a well-fitting mask when you are around any person with COVID-19 while that person is in isolation. °Wear a well-fitting mask when you are around other people until 10 days after your last close contact. °Isolate immediately if you develop symptoms of COVID-19 or test positive. °If you are up to date with your COVID-19 vaccines, you should: °Get tested at least 5 days after your first exposure. A person with COVID-19 is considered infectious starting 2 days before they developed symptoms, or 2 days before the date of their positive test if they do not have symptoms. °Get tested again at least 5 days after the end of isolation for the most recently infected person that lives with you. °Wear a well-fitting mask when you are around any person with COVID-19 while that person is in isolation. °Wear a well-fitting mask around others for 10 days after the end of isolation for the most recently infected person that lives with you. For example, if the last day of isolation for the person most recently infected with COVID-19 was June 30, the new 10-day period to wear a well-fitting mask indoors in public starts on July 1. °Isolate immediately if you develop symptoms of COVID-19 or test positive. °I had COVID-19 and completed isolation. Do I have to quarantine or get tested if someone I live with gets COVID-19 shortly after I completed isolation? °No. If you recently completed isolation and someone that lives with you tests positive for the virus that causes COVID-19 shortly after the end of your isolation period, you do not have to quarantine or get tested as long as you do not develop new symptoms. Once all of the people that live together have completed isolation or quarantine, refer to the guidance below for new exposures to COVID-19. °If you had COVID-19 in the previous 90 days and then came into close contact with someone with COVID-19, you do  not have to quarantine or get tested if you do not have symptoms. But you should: °Wear a well-fitting mask indoors in public for 10 days after your last close contact. °Monitor for COVID-19 symptoms for 10 days from the date of your last close contact. °Isolate immediately and get tested if symptoms develop. °If more than 90 days have passed since your recovery from infection, follow CDC's recommendations for close contacts. These recommendations will differ depending on your vaccination status. °08/22/2020 °Content source: National Center for Immunization and Respiratory Diseases (NCIRD), Division of Viral Diseases °This information is not intended to replace advice given to you by your health care provider. Make sure you discuss any questions you have with your health care provider. °Document Revised: 12/26/2020 Document Reviewed: 12/26/2020 °Elsevier Patient Education © 2022 [employer] ° °

## 2021-05-08 IMAGING — MR MR SHOULDER*R* W/O CM
4 of 5 series · 31 of 40 positions shown · non-contrast
Comparison: None.

CLINICAL DATA: Chronic right shoulder pain and decreased range of
motion. No known injury.

EXAM:
MRI OF THE RIGHT SHOULDER WITHOUT CONTRAST
TECHNIQUE: Multiplanar, multisequence MR imaging of the shoulder was performed.
No intravenous contrast was administered.

[Series 5: PD fat-sat · axial · right · 4.0mm · 0.62mm/px · z∈[-90,+57]mm · 8 of 32 slices shown (1 of 2)]
[im 1/32]
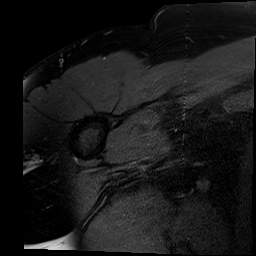
[im 5/32]
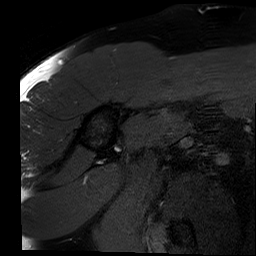
[im 9/32]
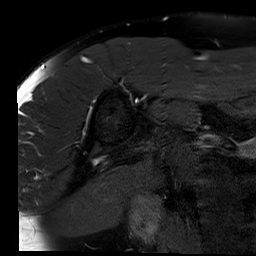
[im 14/32]
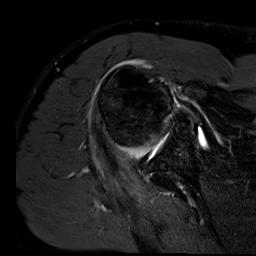
[im 18/32]
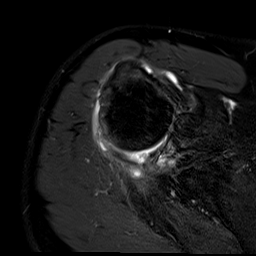
[im 23/32]
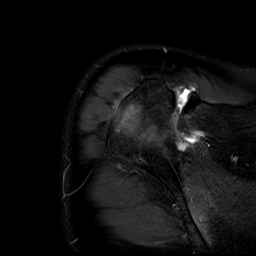
[im 27/32]
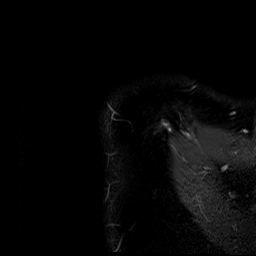
[im 32/32]
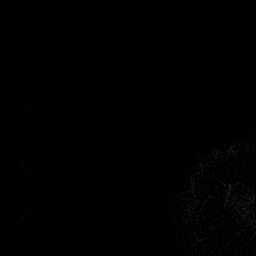

[Series 6: PD fat-sat · oblique · right · 4.0mm · 0.44mm/px · 8 of 26 slices shown (2 of 2)]
[im 1/26]
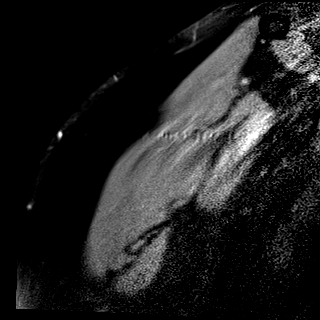
[im 4/26]
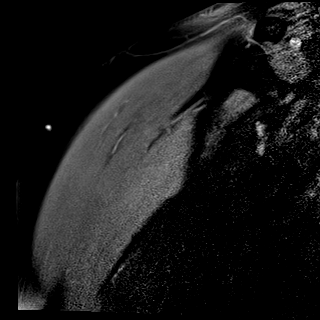
[im 8/26]
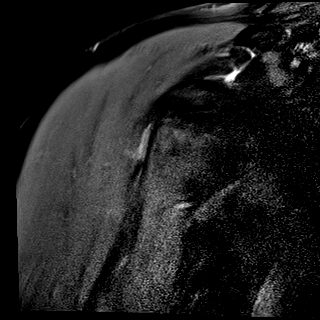
[im 11/26]
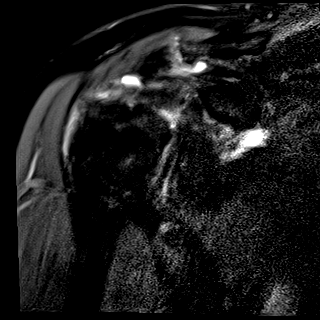
[im 15/26]
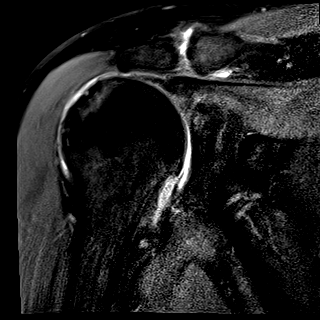
[im 18/26]
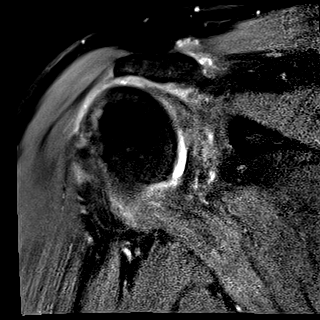
[im 22/26]
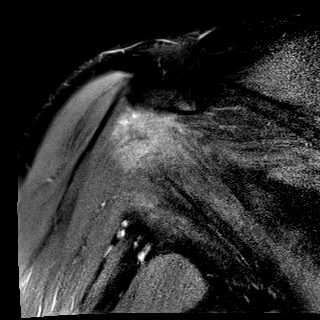
[im 26/26]
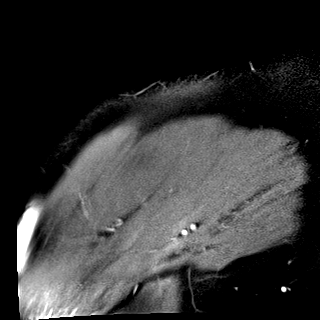

[Series 7: T2 fat-sat · oblique · right · 4.0mm · 0.44mm/px · 8 of 26 slices shown (1 of 2)]
[im 1/26]
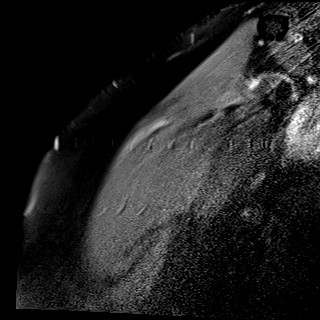
[im 4/26]
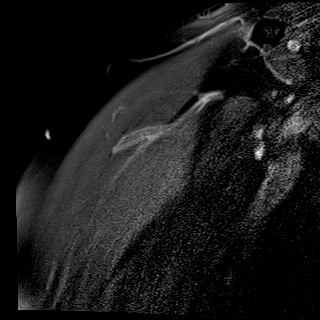
[im 8/26]
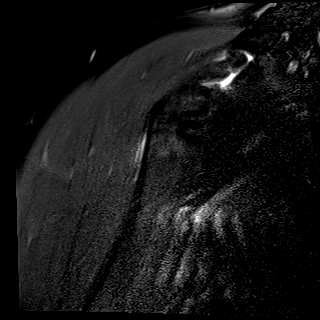
[im 11/26]
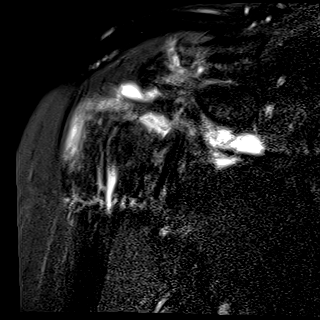
[im 15/26]
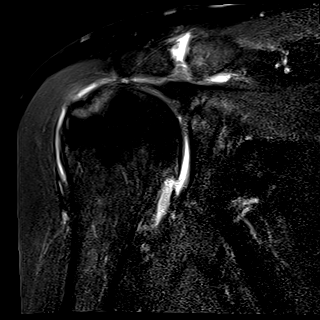
[im 18/26]
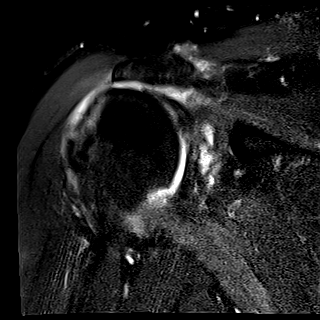
[im 22/26]
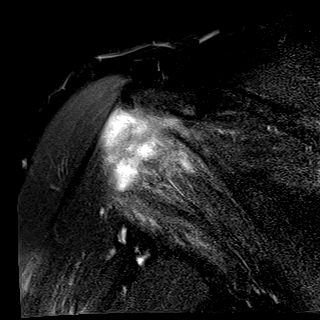
[im 26/26]
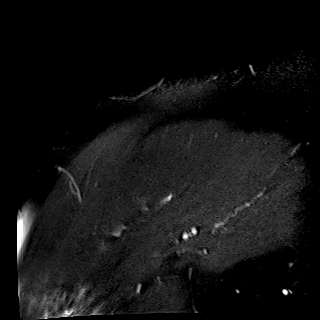

[Series 8: T2 fat-sat · coronal · right · 4.0mm · 0.23mm/px · 7 of 26 slices shown (2 of 2)]
[im 1/26]
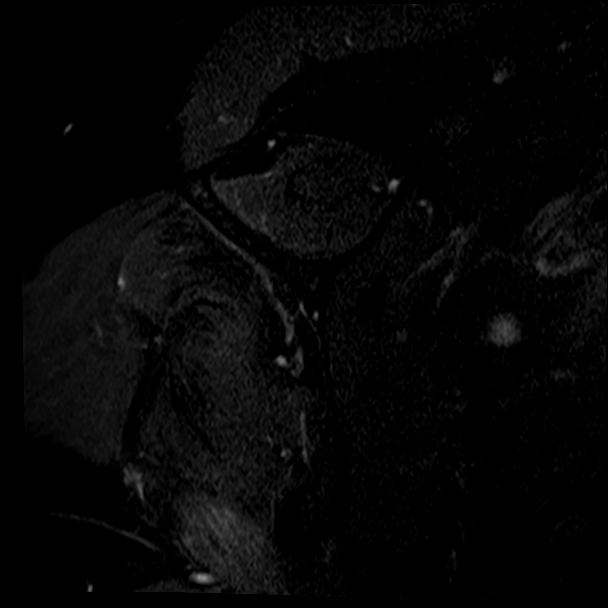
[im 4/26]
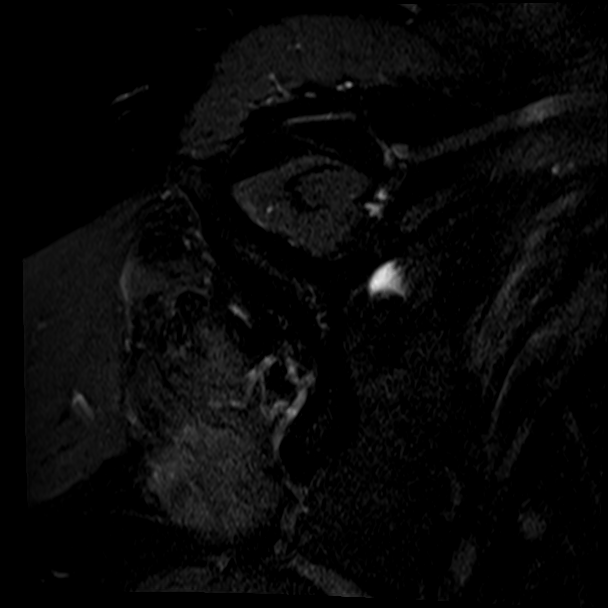
[im 8/26]
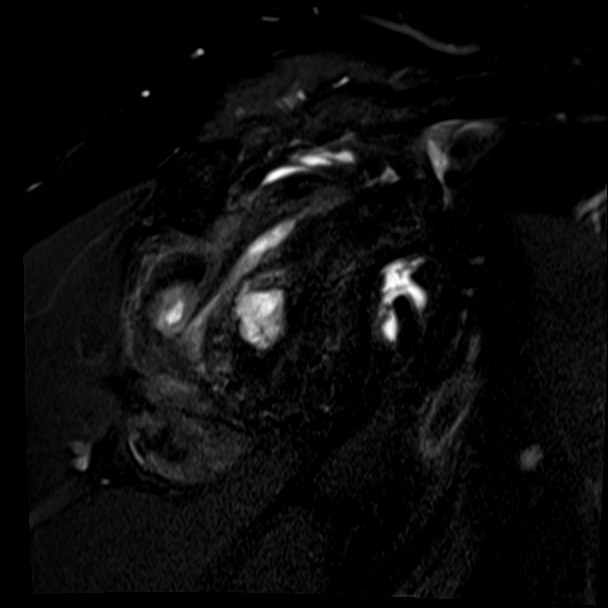
[im 11/26]
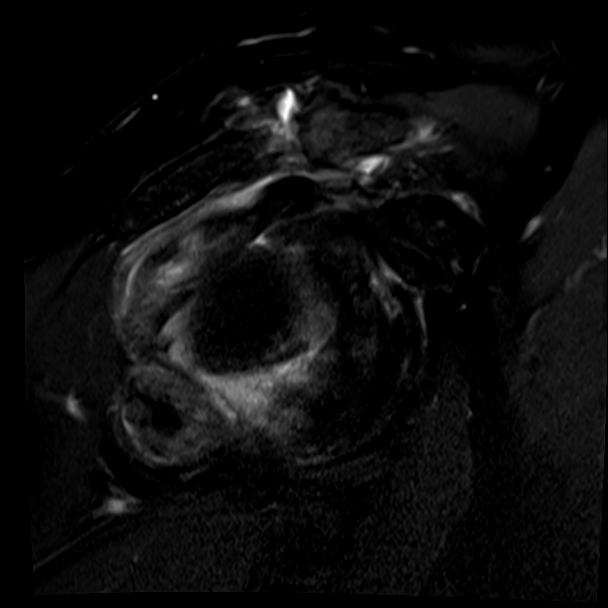
[im 15/26]
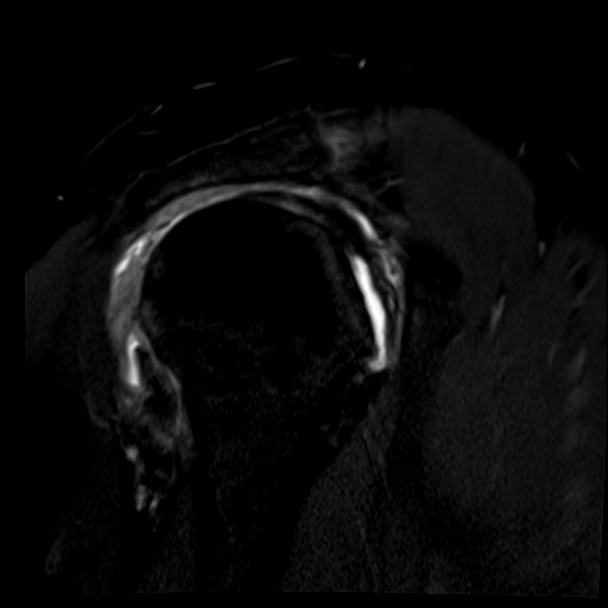
[im 18/26]
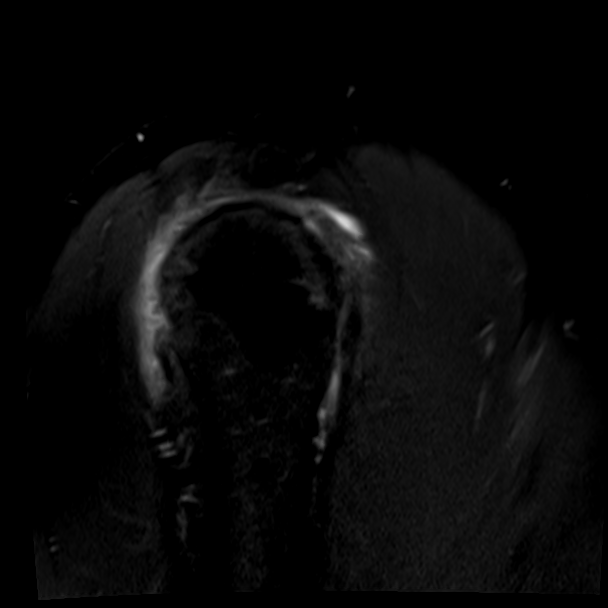
[im 22/26]
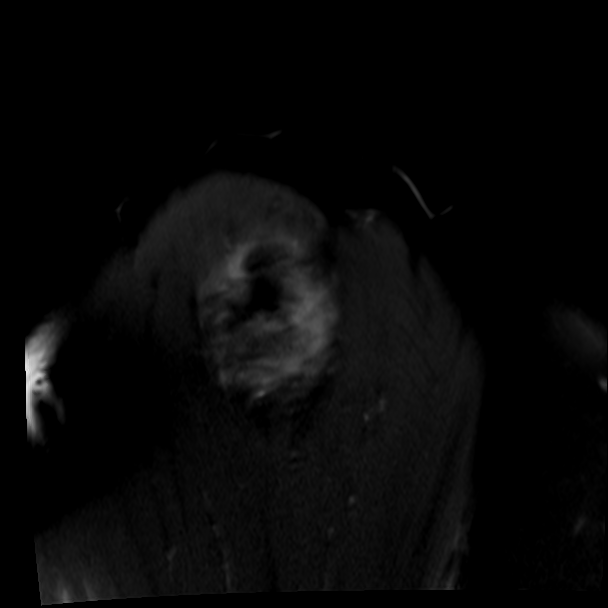

[31 of 40 positions shown; findings below may reference images not displayed]

FINDINGS: Rotator cuff: The supraspinatus, infraspinatus and subscapularis are
all completely torn. Supraspinatus and infraspinatus retraction is
3-4 cm. Subscapularis retraction is 2-3 cm.

Muscles: Moderate infraspinatus atrophy. The subscapularis and
supraspinatus are preserved. Mild-to-moderate teres minor atrophy
also identified.

Biceps long head:  Completely torn from the superior labrum.

Acromioclavicular Joint: Bulky osteoarthritis. Type 1 acromion.
There is subacromial spurring and fluid in the
subacromial/subdeltoid bursa.

Glenohumeral Joint: Moderately severe osteoarthritis with marked
cartilage loss. Subchondral cyst in the posterior glenoid measures
1.4 cm craniocaudal by 1.1 cm transverse by 0.8 cm AP. Small
osteophyte off the humeral head is present. Small glenohumeral joint
effusion contains some debris.

Labrum:  Diffusely degenerated.

Bones:  No fracture or worrisome lesion.

Other: None.
IMPRESSION: Complete supraspinatus, infraspinatus and subscapularis tears with
3-4 cm of supraspinatus and infraspinatus retraction and 2-3 cm of
subscapularis retraction. Moderate fatty atrophy of the
infraspinatus is identified.

Complete tear of the long head of biceps from the superior labrum.

Bulky acromioclavicular and moderately severe glenohumeral
osteoarthritis.

Type 1 acromion with subacromial spurring.

Diffusely degenerated glenoid labrum.

Moderate fatty atrophy of the teres minor is suggestive of
quadrilateral space syndrome. No mass or fluid collection is present
the quadrilateral space.

## 2021-08-05 ENCOUNTER — Other Ambulatory Visit: Payer: Self-pay

## 2021-08-05 ENCOUNTER — Telehealth: Payer: Self-pay | Admitting: Family Medicine

## 2021-08-05 MED ORDER — ATORVASTATIN CALCIUM 20 MG PO TABS: 20.0000 mg | ORAL_TABLET | Freq: Every day | ORAL | 2 refills | Status: DC

## 2021-08-05 MED ORDER — BENAZEPRIL HCL 40 MG PO TABS: 40.0000 mg | ORAL_TABLET | Freq: Every day | ORAL | 2 refills | Status: DC

## 2021-08-05 NOTE — Telephone Encounter (Signed)
Called and notified patient that his medications were sent in as requested.  ?

## 2021-08-05 NOTE — Telephone Encounter (Signed)
Received message stating that the patient has changed insurance companies and needs Atorvastatin and Benazepril sent to [facility] instead of the mail order pharmacy. New RX's started.  ?

## 2021-08-05 NOTE — Telephone Encounter (Signed)
Cody Burke stated that he has a new insurance  and no longer uses the [employer]. He would like is [facility] [place].  Please notify him when they have been sent He stated that he needs  ?atorvastatin  and benazepril    ?

## 2021-09-13 ENCOUNTER — Encounter: Payer: Self-pay | Admitting: Family Medicine

## 2021-09-13 ENCOUNTER — Ambulatory Visit: Payer: BC Managed Care – PPO | Admitting: Family Medicine

## 2021-09-13 VITALS — BP 137/82 | HR 62 | Temp 98.4°F | Wt 316.6 lb

## 2021-09-13 DIAGNOSIS — E1169 Type 2 diabetes mellitus with other specified complication: Secondary | ICD-10-CM | POA: Diagnosis not present

## 2021-09-13 DIAGNOSIS — D582 Other hemoglobinopathies: Secondary | ICD-10-CM

## 2021-09-13 DIAGNOSIS — E7800 Pure hypercholesterolemia, unspecified: Secondary | ICD-10-CM | POA: Diagnosis not present

## 2021-09-13 DIAGNOSIS — I10 Essential (primary) hypertension: Secondary | ICD-10-CM

## 2021-09-13 LAB — URINALYSIS, ROUTINE W REFLEX MICROSCOPIC
Bilirubin, UA: NEGATIVE
Ketones, UA: NEGATIVE
Leukocytes,UA: NEGATIVE
Nitrite, UA: NEGATIVE
Protein,UA: NEGATIVE
RBC, UA: NEGATIVE
Specific Gravity, UA: 1.025 (ref 1.005–1.030)
Urobilinogen, Ur: 1 mg/dL (ref 0.2–1.0)
pH, UA: 5.5 (ref 5.0–7.5)

## 2021-09-13 LAB — MICROALBUMIN, URINE WAIVED
Creatinine, Urine Waived: 100 mg/dL (ref 10–300)
Microalb, Ur Waived: 10 mg/L (ref 0–19)
Microalb/Creat Ratio: <30 mg/g (ref <30)

## 2021-09-13 LAB — BAYER DCA HB A1C WAIVED: HB A1C (BAYER DCA - WAIVED): 7.8 % — ABNORMAL HIGH (ref 4.8–5.6)

## 2021-09-13 MED ORDER — METFORMIN HCL 500 MG PO TABS: 1000.0000 mg | ORAL_TABLET | Freq: Two times a day (BID) | ORAL | 1 refills | Status: DC

## 2021-09-13 MED ORDER — OZEMPIC (1 MG/DOSE) 2 MG/1.5ML ~~LOC~~ SOPN: 1.0000 mg | PEN_INJECTOR | SUBCUTANEOUS | 1 refills | Status: DC

## 2021-09-13 NOTE — Assessment & Plan Note (Signed)
Under good control on current regimen. Continue current regimen. Continue to monitor. Call with any concerns. Refills given. Labs drawn today.   

## 2021-09-13 NOTE — Progress Notes (Signed)
? ?BP 137/82   Pulse 62   Temp 98.4 ?F (36.9 ?C)   Wt (!) 316 lb 9.6 oz (143.6 kg)   SpO2 99%   BMI 41.77 kg/m?   ? ?Subjective:  ? ? Patient ID: Cody Burke, male    DOB: [birth year], 56 y.o.   MRN: [id number] ? ?HPI: ?Cody Burke is a 56 y.o. male ? ?Chief Complaint  ?Patient presents with  ? Diabetes  ?  Has not had eye exam this year   ? Hypertension  ? Hyperlipidemia  ? Obesity  ? ?DIABETES- has only been taking 2 pills 2x a day of his metformin for about a month ?Hypoglycemic episodes:no ?Polydipsia/polyuria: no ?Visual disturbance: no ?Chest pain: no ?Paresthesias: no ?Glucose Monitoring: yes ? Accucheck frequency: Daily ? Fasting glucose:150 average ?Taking Insulin?: no ?Blood Pressure Monitoring: not checking ?Retinal Examination: Up to Date ?Foot Exam: Up to Date ?Diabetic Education: Completed ?Pneumovax: Up to Date ?Influenza: Up to Date ?Aspirin: yes ? ?HYPERTENSION / HYPERLIPIDEMIA ?Satisfied with current treatment? yes ?Duration of hypertension: chronic ?BP monitoring frequency: not checking ?BP medication side effects: no ?Past BP meds: benazepril ?Duration of hyperlipidemia: chronic ?Cholesterol medication side effects: no ?Cholesterol supplements: fish oil ?Past cholesterol medications: atorvastatin ?Medication compliance: excellent compliance ?Aspirin: yes ?Recent stressors: no ?Recurrent headaches: no ?Visual changes: no ?Palpitations: no ?Dyspnea: no ?Chest pain: no ?Lower extremity edema: no ?Dizzy/lightheaded: no ? ?Relevant past medical, surgical, family and social history reviewed and updated as indicated. Interim medical history since our last visit reviewed. ?Allergies and medications reviewed and updated. ? ?Review of Systems  ?Constitutional: Negative.   ?Respiratory: Negative.    ?Cardiovascular: Negative.   ?Gastrointestinal: Negative.   ?Musculoskeletal: Negative.   ?Psychiatric/Behavioral: Negative.    ? ?Per HPI unless specifically indicated above ? ?   ?Objective:  ?  ?BP 137/82    Pulse 62   Temp 98.4 ?F (36.9 ?C)   Wt (!) 316 lb 9.6 oz (143.6 kg)   SpO2 99%   BMI 41.77 kg/m?   ?Wt Readings from Last 3 Encounters:  ?09/13/21 (!) 316 lb 9.6 oz (143.6 kg)  ?03/14/21 (!) 307 lb (139.3 kg)  ?09/13/20 (!) 307 lb (139.3 kg)  ?  ?Physical Exam ?Vitals and nursing note reviewed.  ?Constitutional:   ?   General: He is not in acute distress. ?   Appearance: Normal appearance. He is obese. He is not ill-appearing, toxic-appearing or diaphoretic.  ?HENT:  ?   Head: Normocephalic and atraumatic.  ?   Right Ear: External ear normal.  ?   Left Ear: External ear normal.  ?   Nose: Nose normal.  ?   Mouth/Throat:  ?   Mouth: Mucous membranes are moist.  ?   Pharynx: Oropharynx is clear.  ?Eyes:  ?   General: No scleral icterus.    ?   Right eye: No discharge.     ?   Left eye: No discharge.  ?   Extraocular Movements: Extraocular movements intact.  ?   Conjunctiva/sclera: Conjunctivae normal.  ?   Pupils: Pupils are equal, round, and reactive to light.  ?Cardiovascular:  ?   Rate and Rhythm: Normal rate and regular rhythm.  ?   Pulses: Normal pulses.  ?   Heart sounds: Normal heart sounds. No murmur heard. ?  No friction rub. No gallop.  ?Pulmonary:  ?   Effort: Pulmonary effort is normal. No respiratory distress.  ?   Breath sounds: Normal breath sounds. No stridor.  No wheezing, rhonchi or rales.  ?Chest:  ?   Chest wall: No tenderness.  ?Musculoskeletal:     ?   General: Normal range of motion.  ?   Cervical back: Normal range of motion and neck supple.  ?Skin: ?   General: Skin is warm and dry.  ?   Capillary Refill: Capillary refill takes less than 2 seconds.  ?   Coloration: Skin is not jaundiced or pale.  ?   Findings: No bruising, erythema, lesion or rash.  ?Neurological:  ?   General: No focal deficit present.  ?   Mental Status: He is alert and oriented to person, place, and time. Mental status is at baseline.  ?Psychiatric:     ?   Mood and Affect: Mood normal.     ?   Behavior: Behavior  normal.     ?   Thought Content: Thought content normal.     ?   Judgment: Judgment normal.  ? ? ?Results for orders placed or performed in visit on 03/14/21  ?Lipid Profile  ?Result Value Ref Range  ? Cholesterol, Total 120 100 - 199 mg/dL  ? Triglycerides 149 0 - 149 mg/dL  ? HDL 43 >39 mg/dL  ? VLDL Cholesterol Cal 26 5 - 40 mg/dL  ? LDL Chol Calc (NIH) 51 0 - 99 mg/dL  ? Chol/HDL Ratio 2.8 0.0 - 5.0 ratio  ?Bayer DCA Hb A1c Waived (STAT)  ?Result Value Ref Range  ? HB A1C (BAYER DCA - WAIVED) 7.1 (H) 4.8 - 5.6 %  ?Urinalysis, Routine w reflex microscopic  ?Result Value Ref Range  ? Specific Gravity, UA 1.020 1.005 - 1.030  ? pH, UA 6.5 5.0 - 7.5  ? Color, UA Yellow Yellow  ? Appearance Ur Clear Clear  ? Leukocytes,UA Negative Negative  ? Protein,UA Negative Negative/Trace  ? Glucose, UA Negative Negative  ? Ketones, UA Negative Negative  ? RBC, UA Negative Negative  ? Bilirubin, UA Negative Negative  ? Urobilinogen, Ur 1.0 0.2 - 1.0 mg/dL  ? Nitrite, UA Negative Negative  ?Microalbumin, Urine Waived  ?Result Value Ref Range  ? Microalb, Ur Waived 10 0 - 19 mg/L  ? Creatinine, Urine Waived 100 10 - 300 mg/dL  ? Microalb/Creat Ratio <30 <30 mg/g  ?CBC with Differential/Platelet  ?Result Value Ref Range  ? WBC 10.3 3.4 - 10.8 x10E3/uL  ? RBC 5.59 4.14 - 5.80 x10E6/uL  ? Hemoglobin 16.6 13.0 - 17.7 g/dL  ? Hematocrit 51.2 (H) 37.5 - 51.0 %  ? MCV 92 79 - 97 fL  ? MCH 29.7 26.6 - 33.0 pg  ? MCHC 32.4 31.5 - 35.7 g/dL  ? RDW 12.9 11.6 - 15.4 %  ? Platelets 246 150 - 450 x10E3/uL  ? Neutrophils 42 Not Estab. %  ? Lymphs 43 Not Estab. %  ? Monocytes 9 Not Estab. %  ? Eos 4 Not Estab. %  ? Basos 1 Not Estab. %  ? Neutrophils Absolute 4.4 1.4 - 7.0 x10E3/uL  ? Lymphocytes Absolute 4.3 (H) 0.7 - 3.1 x10E3/uL  ? Monocytes Absolute 1.0 (H) 0.1 - 0.9 x10E3/uL  ? EOS (ABSOLUTE) 0.4 0.0 - 0.4 x10E3/uL  ? Basophils Absolute 0.1 0.0 - 0.2 x10E3/uL  ? Immature Granulocytes 1 Not Estab. %  ? Immature Grans (Abs) 0.1 0.0 - 0.1  x10E3/uL  ?Comprehensive metabolic panel  ?Result Value Ref Range  ? Glucose 149 (H) 70 - 99 mg/dL  ? BUN 12 6 - 24  mg/dL  ? Creatinine, Ser 0.91 0.76 - 1.27 mg/dL  ? eGFR 99 >59 mL/min/1.73  ? BUN/Creatinine Ratio 13 9 - 20  ? Sodium 137 134 - 144 mmol/L  ? Potassium 4.7 3.5 - 5.2 mmol/L  ? Chloride 100 96 - 106 mmol/L  ? CO2 25 20 - 29 mmol/L  ? Calcium 8.8 8.7 - 10.2 mg/dL  ? Total Protein 6.6 6.0 - 8.5 g/dL  ? Albumin 4.0 3.8 - 4.9 g/dL  ? Globulin, Total 2.6 1.5 - 4.5 g/dL  ? Albumin/Globulin Ratio 1.5 1.2 - 2.2  ? Bilirubin Total 0.4 0.0 - 1.2 mg/dL  ? Alkaline Phosphatase 90 44 - 121 IU/L  ? AST 35 0 - 40 IU/L  ? ALT 43 0 - 44 IU/L  ?TSH  ?Result Value Ref Range  ? TSH 2.330 0.450 - 4.500 uIU/mL  ?PSA  ?Result Value Ref Range  ? Prostate Specific Ag, Serum <0.1 0.0 - 4.0 ng/mL  ? ?   ?Assessment & Plan:  ? ?Problem List Items Addressed This Visit   ? ?  ? Cardiovascular and Mediastinum  ? Hypertension - Primary  ?  Under good control on current regimen. Continue current regimen. Continue to monitor. Call with any concerns. Refills given. Labs drawn today.  ? ? ?  ?  ? Relevant Orders  ? Comprehensive metabolic panel  ? Microalbumin, Urine Waived  ? Urinalysis, Routine w reflex microscopic  ?  ? Endocrine  ? Diabetes mellitus associated with hormonal etiology ([place])  ?  Going in the wrong direction with A1c of 7.8 up from 7.1. Will work on diet and exercise and make sure to take his metformin 1082m BID. Continue ozempic. Recheck 3 months. Call with any concerns.  ? ?  ?  ? Relevant Medications  ? metFORMIN (GLUCOPHAGE) 500 MG tablet  ? Semaglutide, 1 MG/DOSE, (OZEMPIC, 1 MG/DOSE,) 2 MG/1.5ML SOPN  ? Other Relevant Orders  ? Comprehensive metabolic panel  ? Bayer DCA Hb A1c Waived  ?  ? Other  ? Hyperlipidemia  ?  Under good control on current regimen. Continue current regimen. Continue to monitor. Call with any concerns. Refills given. Labs drawn today.  ? ?  ?  ? Relevant Orders  ? Comprehensive metabolic  panel  ? Lipid Panel w/o Chol/HDL Ratio  ? [place]  ?  Encouraged diet and exercise with goal of losing 1-2lbs per week. Call with any concerns.  ? ?  ?  ? Relevant Medications  ? metFORMIN (GLUCOPHAGE) 500 MG

## 2021-09-13 NOTE — Assessment & Plan Note (Signed)
Going in the wrong direction with A1c of 7.8 up from 7.1. Will work on diet and exercise and make sure to take his metformin '1000mg'$  BID. Continue ozempic. Recheck 3 months. Call with any concerns.  ?

## 2021-09-13 NOTE — Assessment & Plan Note (Signed)
Rechecking labs today. Await results.  

## 2021-09-13 NOTE — Assessment & Plan Note (Signed)
Encouraged diet and exercise with goal of losing 1-2lbs per week. Call with any concerns.  

## 2021-09-14 LAB — CBC WITH DIFFERENTIAL/PLATELET
Basophils Absolute: 0.1 10*3/uL (ref 0.0–0.2)
Basos: 1 %
EOS (ABSOLUTE): 0.4 10*3/uL (ref 0.0–0.4)
Eos: 5 %
Hematocrit: 50.7 % (ref 37.5–51.0)
Hemoglobin: 17 g/dL (ref 13.0–17.7)
Immature Grans (Abs): 0.1 10*3/uL (ref 0.0–0.1)
Immature Granulocytes: 1 %
Lymphocytes Absolute: 3.5 10*3/uL — ABNORMAL HIGH (ref 0.7–3.1)
Lymphs: 41 %
MCH: 30.3 pg (ref 26.6–33.0)
MCHC: 33.5 g/dL (ref 31.5–35.7)
MCV: 90 fL (ref 79–97)
Monocytes Absolute: 0.9 10*3/uL (ref 0.1–0.9)
Monocytes: 11 %
Neutrophils Absolute: 3.5 10*3/uL (ref 1.4–7.0)
Neutrophils: 41 %
Platelets: 210 10*3/uL (ref 150–450)
RBC: 5.61 x10E6/uL (ref 4.14–5.80)
RDW: 12.8 % (ref 11.6–15.4)
WBC: 8.5 10*3/uL (ref 3.4–10.8)

## 2021-09-14 LAB — COMPREHENSIVE METABOLIC PANEL
ALT: 45 IU/L — ABNORMAL HIGH (ref 0–44)
AST: 45 IU/L — ABNORMAL HIGH (ref 0–40)
Albumin/Globulin Ratio: 1.4 (ref 1.2–2.2)
Albumin: 4 g/dL (ref 3.8–4.9)
Alkaline Phosphatase: 104 IU/L (ref 44–121)
BUN/Creatinine Ratio: 18 (ref 9–20)
BUN: 13 mg/dL (ref 6–24)
Bilirubin Total: 0.3 mg/dL (ref 0.0–1.2)
CO2: 20 mmol/L (ref 20–29)
Calcium: 9.1 mg/dL (ref 8.7–10.2)
Chloride: 101 mmol/L (ref 96–106)
Creatinine, Ser: 0.74 mg/dL — ABNORMAL LOW (ref 0.76–1.27)
Globulin, Total: 2.8 g/dL (ref 1.5–4.5)
Glucose: 186 mg/dL — ABNORMAL HIGH (ref 70–99)
Potassium: 4.6 mmol/L (ref 3.5–5.2)
Sodium: 140 mmol/L (ref 134–144)
Total Protein: 6.8 g/dL (ref 6.0–8.5)
eGFR: 106 mL/min/{1.73_m2} (ref >59)

## 2021-09-14 LAB — LIPID PANEL W/O CHOL/HDL RATIO
Cholesterol, Total: 122 mg/dL (ref 100–199)
HDL: 47 mg/dL (ref >39)
LDL Chol Calc (NIH): 54 mg/dL (ref 0–99)
Triglycerides: 120 mg/dL (ref 0–149)
VLDL Cholesterol Cal: 21 mg/dL (ref 5–40)

## 2021-11-28 ENCOUNTER — Encounter: Payer: Self-pay | Admitting: Physician Assistant

## 2021-11-28 ENCOUNTER — Ambulatory Visit: Payer: BC Managed Care – PPO | Admitting: Physician Assistant

## 2021-11-28 VITALS — BP 131/84 | HR 94 | Temp 98.9°F | Ht 72.99 in | Wt 317.8 lb

## 2021-11-28 DIAGNOSIS — L89891 Pressure ulcer of other site, stage 1: Secondary | ICD-10-CM | POA: Insufficient documentation

## 2021-11-28 NOTE — Patient Instructions (Addendum)
I am concerned that you may have a pressure injury to the bottom of your foot I have placed a referral to Podiatry so they can examine your feet and evaluate the wound  Our referral team should reach out soon to help coordinate this apt.  Continue with your usual foot checks Wear cotton socks Make sure you are keeping your feet clean and dry prior to donning shoes Try to keep your feet moisturized and the skin intact  Do not try to manipulate the skin over the wound, do not try to drain it or scrape it.   I do not recommend getting your feet worked on or a pedicure unless performed by Podiatry until the wound is healed and Podiatry gives you clearance.

## 2021-11-28 NOTE — Assessment & Plan Note (Signed)
Acute, new problem Suspect this may have been a blister from new boots but may be evolving into pressure injury on PE  Patient is diabetic and tries to do regular, daily foot care  Recommend he seek care from Podiatry for eval and management of this injury given DM dx and overall feet condition May need shoe recommendations from Podiatry as well due to DM dx  Counseled him against pedicures, manipulation of blister/injury until evaluated by Podiatry Recommend he engage in overall foot care- gentle cleansing with warm water and soap, thorough drying, using cotton socks and appropriate sized shoes with support, moisturizing to reduce callus formation.  Follow up as needed

## 2021-11-28 NOTE — Progress Notes (Signed)
Established Patient Office Visit  Name: Cody Burke   MRN: [id number]    DOB: 25-May-1966   Date:11/28/2021  Today's Provider: Talitha Givens, MHS, PA-C Introduced myself to the patient as a PA-C and provided education on APPs in clinical practice.         Subjective  Chief Complaint  Chief Complaint  Patient presents with   Foot Pain    Started hurting about 3 weeks ago after he started wearing a new pair of boots. Would like the spot looked at.     HPI  Reports he has a "sore " of the bottom of his right  foot and he is diabetic so he is concerned  Reports he bought a new pair of boots about 3 weeks ago that started causing pain  He and his wife looked at it - reports soft, blister type appearance- was using a bandaid and mupirocin to treat Is concerned for [place] pain has resolved and he has been able to wear the new boots without issue    Patient Active Problem List   Diagnosis Date Noted   Pressure injury of right foot, stage 1 11/28/2021   Elevated hemoglobin ([place]) 03/14/2021   Morbid obesity ([place]) 04/14/2018   Sleep apnea 05/01/2016   Benign neoplasm of transverse colon    Diabetes mellitus associated with hormonal etiology ([place])    Hyperlipidemia    Hypertension     Past Surgical History:  Procedure Laterality Date   COLONOSCOPY WITH PROPOFOL N/A 09/21/2015   Procedure: COLONOSCOPY WITH PROPOFOL;  Surgeon: Lucilla Lame, MD;  Location: [facility];  Service: Endoscopy;  Laterality: N/A;  Diabetic - oral meds sleep apnea   VASCULAR SURGERY Bilateral    legs    Family History  Problem Relation Age of Onset   Cancer Mother    Hypertension Father     Social History   Tobacco Use   Smoking status: Former    Types: Cigarettes    Quit date: 07/30/2003    Years since quitting: 18.3   Smokeless tobacco: Never  Substance Use Topics   Alcohol use: Yes    Comment: very little     Current Outpatient Medications:    aspirin EC 81  MG tablet, Take 81 mg by mouth daily., Disp: , Rfl:    atorvastatin (LIPITOR) 20 MG tablet, Take 1 tablet (20 mg total) by mouth daily., Disp: 90 tablet, Rfl: 2   benazepril (LOTENSIN) 40 MG tablet, Take 1 tablet (40 mg total) by mouth daily., Disp: 90 tablet, Rfl: 2   fluticasone (FLONASE) 50 MCG/ACT nasal spray, Place into both nostrils., Disp: , Rfl:    glucose blood test strip, Please dispense insurance preference. Use to check BS BID, Disp: 200 each, Rfl: 4   Insulin Pen Needle (PEN NEEDLES) 31G X 8 MM MISC, 1 each by Does not apply route once a week., Disp: 52 each, Rfl: 1   metFORMIN (GLUCOPHAGE) 500 MG tablet, Take 2 tablets (1,000 mg total) by mouth 2 (two) times daily with a meal., Disp: 360 tablet, Rfl: 1   Multiple Vitamins-Minerals (MULTIVITAMIN GUMMIES ADULT PO), Take by mouth daily., Disp: , Rfl:    Omega-3 Fatty Acids (FISH OIL) 1000 MG CAPS, Take 1 mg by mouth daily., Disp: , Rfl:    Semaglutide, 1 MG/DOSE, (OZEMPIC, 1 MG/DOSE,) 2 MG/1.5ML SOPN, Inject 1 mg into the skin once a week., Disp: 18 mL, Rfl: 1  No  Known Allergies  I personally reviewed active problem list, medication list, allergies, notes from last encounter, lab results with the patient/caregiver today.   Review of Systems  Skin:        Blister on right foot       Objective  Vitals:   11/28/21 1344  BP: 131/84  Pulse: 94  Temp: 98.9 F (37.2 C)  TempSrc: Oral  SpO2: 96%  Weight: (!) 317 lb 12.8 oz (144.2 kg)  Height: 6' 0.99" (1.854 m)    Body mass index is 41.94 kg/m.  Physical Exam Vitals reviewed.  Constitutional:      General: He is awake.     Appearance: Normal appearance. He is well-developed and well-groomed. He is obese.  HENT:     Head: Normocephalic and atraumatic.  Pulmonary:     Effort: Pulmonary effort is normal.  Feet:     Right foot:     Skin integrity: Ulcer, blister, callus and dry skin present. No skin breakdown.     Toenail Condition: Fungal disease present.    Left  foot:     Skin integrity: Callus and dry skin present. No ulcer, blister or skin breakdown.     Toenail Condition: Fungal disease present.    Comments: Potential blister/ ulceration on right foot approx 5 cm in diameter with what appears to be sanguinous fluid contained within. See photos attached to PE Neurological:     Mental Status: He is alert.  Psychiatric:        Attention and Perception: Attention normal.        Mood and Affect: Mood and affect normal.        Speech: Speech normal.        Behavior: Behavior normal. Behavior is cooperative.     Media Information   Document Information  Photos    11/28/2021 14:11  Attached To:  Office Visit on 11/28/21 with Murriel Holwerda, Dani Gobble, PA-C   Source Information  Dajai Wahlert, [place]  [facility]     Media Information   Document Information  Photos    11/28/2021 14:12  Attached To:  Office Visit on 11/28/21 with Aalayah Riles, Dani Gobble, PA-C   Source Information  Ben Habermann, Dani Gobble, PA-C  [facility] [employer]    Recent Results (from the past 2160 hour(s))  Bayer DCA Hb A1c Waived     Status: Abnormal   Collection Time: 09/13/21  8:33 AM  Result Value Ref Range   HB A1C (BAYER DCA - WAIVED) 7.8 (H) 4.8 - 5.6 %    Comment:          Prediabetes: 5.7 - 6.4          Diabetes: >6.4          Glycemic control for adults with diabetes: <7.0   Microalbumin, Urine Waived     Status: None   Collection Time: 09/13/21  8:33 AM  Result Value Ref Range   Microalb, Ur Waived 10 0 - 19 mg/L   Creatinine, Urine Waived 100 10 - 300 mg/dL   Microalb/Creat Ratio <30 <30 mg/g    Comment:                              Abnormal:       30 - 300                         High  Abnormal:           >300   Urinalysis, Routine w reflex microscopic     Status: Abnormal   Collection Time: 09/13/21  8:33 AM  Result Value Ref Range   Specific Gravity, UA 1.025 1.005 - 1.030   pH, UA 5.5 5.0 - 7.5   Color, UA Yellow Yellow   Appearance Ur Clear Clear    Leukocytes,UA Negative Negative   Protein,UA Negative Negative/Trace   Glucose, UA Trace (A) Negative   Ketones, UA Negative Negative   RBC, UA Negative Negative   Bilirubin, UA Negative Negative   Urobilinogen, Ur 1.0 0.2 - 1.0 mg/dL   Nitrite, UA Negative Negative  Comprehensive metabolic panel     Status: Abnormal   Collection Time: 09/13/21  8:35 AM  Result Value Ref Range   Glucose 186 (H) 70 - 99 mg/dL   BUN 13 6 - 24 mg/dL   Creatinine, Ser 0.74 (L) 0.76 - 1.27 mg/dL   eGFR 106 >59 mL/min/1.73   BUN/Creatinine Ratio 18 9 - 20   Sodium 140 134 - 144 mmol/L   Potassium 4.6 3.5 - 5.2 mmol/L   Chloride 101 96 - 106 mmol/L   CO2 20 20 - 29 mmol/L   Calcium 9.1 8.7 - 10.2 mg/dL   Total Protein 6.8 6.0 - 8.5 g/dL   Albumin 4.0 3.8 - 4.9 g/dL   Globulin, Total 2.8 1.5 - 4.5 g/dL   Albumin/Globulin Ratio 1.4 1.2 - 2.2   Bilirubin Total 0.3 0.0 - 1.2 mg/dL   Alkaline Phosphatase 104 44 - 121 IU/L   AST 45 (H) 0 - 40 IU/L   ALT 45 (H) 0 - 44 IU/L  CBC with Differential/Platelet     Status: Abnormal   Collection Time: 09/13/21  8:35 AM  Result Value Ref Range   WBC 8.5 3.4 - 10.8 x10E3/uL   RBC 5.61 4.14 - 5.80 x10E6/uL   Hemoglobin 17.0 13.0 - 17.7 g/dL   Hematocrit 50.7 37.5 - 51.0 %   MCV 90 79 - 97 fL   MCH 30.3 26.6 - 33.0 pg   MCHC 33.5 31.5 - 35.7 g/dL   RDW 12.8 11.6 - 15.4 %   Platelets 210 150 - 450 x10E3/uL   Neutrophils 41 Not Estab. %   Lymphs 41 Not Estab. %   Monocytes 11 Not Estab. %   Eos 5 Not Estab. %   Basos 1 Not Estab. %   Neutrophils Absolute 3.5 1.4 - 7.0 x10E3/uL   Lymphocytes Absolute 3.5 (H) 0.7 - 3.1 x10E3/uL   Monocytes Absolute 0.9 0.1 - 0.9 x10E3/uL   EOS (ABSOLUTE) 0.4 0.0 - 0.4 x10E3/uL   Basophils Absolute 0.1 0.0 - 0.2 x10E3/uL   Immature Granulocytes 1 Not Estab. %   Immature Grans (Abs) 0.1 0.0 - 0.1 x10E3/uL  Lipid Panel w/o Chol/HDL Ratio     Status: None   Collection Time: 09/13/21  8:35 AM  Result Value Ref Range    Cholesterol, Total 122 100 - 199 mg/dL   Triglycerides 120 0 - 149 mg/dL   HDL 47 >39 mg/dL   VLDL Cholesterol Cal 21 5 - 40 mg/dL   LDL Chol Calc (NIH) 54 0 - 99 mg/dL     PHQ2/9:    11/28/2021    1:51 PM 09/13/2021    8:27 AM 03/14/2021    8:12 AM 03/09/2020    8:58 AM 03/08/2019    9:43 AM  Depression screen PHQ 2/9  Decreased Interest 0 0 0 0 1  Down, Depressed, Hopeless 0 0 0 0 1  PHQ - 2 Score 0 0 0 0 2  Altered sleeping 0 0 1 0 0  Tired, decreased energy 0 1 0 0 0  Change in appetite 0 1 0 0 0  Feeling bad or failure about yourself  0 0 0 0 0  Trouble concentrating 0 0 0 0 0  Moving slowly or fidgety/restless 0 0 0 0 0  Suicidal thoughts 0 0 0 0 0  PHQ-9 Score 0 2 1 0 2  Difficult doing work/chores Not difficult at all   Not difficult at all Not difficult at all      Fall Risk:    11/28/2021    1:51 PM 09/13/2021    8:27 AM 04/08/2019    4:07 PM 01/05/2018    8:12 AM 09/14/2017    3:35 PM  Fall Risk   Falls in the past year? 0 0 0 No No  Number falls in past yr: 0 0 0    Injury with Fall? 0 0 0    Risk for fall due to : No Fall Risks No Fall Risks     Follow up Falls evaluation completed Falls evaluation completed         Functional Status Survey:      Assessment & Plan  Problem List Items Addressed This Visit       Musculoskeletal and Integument   Pressure injury of right foot, stage 1 - Primary    Acute, new problem Suspect this may have been a blister from new boots but may be evolving into pressure injury on PE  Patient is diabetic and tries to do regular, daily foot care  Recommend he seek care from Podiatry for eval and management of this injury given DM dx and overall feet condition May need shoe recommendations from Podiatry as well due to DM dx  Counseled him against pedicures, manipulation of blister/injury until evaluated by Podiatry Recommend he engage in overall foot care- gentle cleansing with warm water and soap, thorough drying, using  cotton socks and appropriate sized shoes with support, moisturizing to reduce callus formation.  Follow up as needed       Relevant Orders   Ambulatory referral to Podiatry     No follow-ups on file.   I, Garrie Woodin E Esa Raden, PA-C, have reviewed all documentation for this visit. The documentation on 11/28/21 for the exam, diagnosis, procedures, and orders are all accurate and complete.   Talitha Givens, MHS, PA-C [facility]

## 2021-12-02 ENCOUNTER — Ambulatory Visit: Payer: BC Managed Care – PPO | Admitting: Nurse Practitioner

## 2021-12-02 DIAGNOSIS — L97511 Non-pressure chronic ulcer of other part of right foot limited to breakdown of skin: Secondary | ICD-10-CM | POA: Diagnosis not present

## 2021-12-02 DIAGNOSIS — E1142 Type 2 diabetes mellitus with diabetic polyneuropathy: Secondary | ICD-10-CM | POA: Diagnosis not present

## 2021-12-16 DIAGNOSIS — E1142 Type 2 diabetes mellitus with diabetic polyneuropathy: Secondary | ICD-10-CM | POA: Diagnosis not present

## 2021-12-16 DIAGNOSIS — L97511 Non-pressure chronic ulcer of other part of right foot limited to breakdown of skin: Secondary | ICD-10-CM | POA: Diagnosis not present

## 2021-12-24 DIAGNOSIS — E1142 Type 2 diabetes mellitus with diabetic polyneuropathy: Secondary | ICD-10-CM | POA: Diagnosis not present

## 2021-12-24 DIAGNOSIS — L97511 Non-pressure chronic ulcer of other part of right foot limited to breakdown of skin: Secondary | ICD-10-CM | POA: Diagnosis not present

## 2021-12-27 ENCOUNTER — Ambulatory Visit: Payer: BC Managed Care – PPO | Admitting: Family Medicine

## 2022-01-03 ENCOUNTER — Encounter: Payer: Self-pay | Admitting: Family Medicine

## 2022-01-03 ENCOUNTER — Ambulatory Visit: Payer: BC Managed Care – PPO | Admitting: Family Medicine

## 2022-01-03 VITALS — BP 137/82 | HR 76 | Temp 97.8°F | Wt 319.2 lb

## 2022-01-03 DIAGNOSIS — E1169 Type 2 diabetes mellitus with other specified complication: Secondary | ICD-10-CM | POA: Diagnosis not present

## 2022-01-03 LAB — BAYER DCA HB A1C WAIVED: HB A1C (BAYER DCA - WAIVED): 7.5 % — ABNORMAL HIGH (ref 4.8–5.6)

## 2022-01-03 MED ORDER — METFORMIN HCL ER 500 MG PO TB24
1000.0000 mg | ORAL_TABLET | Freq: Two times a day (BID) | ORAL | 1 refills | Status: DC
Start: 2022-01-03 — End: 2022-04-11

## 2022-01-03 NOTE — Progress Notes (Signed)
BP 137/82   Pulse 76   Temp 97.8 F (36.6 C)   Wt (!) 319 lb 3.2 oz (144.8 kg)   SpO2 98%   BMI 42.12 kg/m    Subjective:    Patient ID: Cody Burke, male    DOB: 02-Dec-1965, 56 y.o.   MRN: [id number]  HPI: Cody Burke is a 56 y.o. male  Chief Complaint  Patient presents with   Diabetes    Patient states he has not had eye exam this year    DIABETES Hypoglycemic episodes:no Polydipsia/polyuria: no Visual disturbance: no Chest pain: no Paresthesias: yes Glucose Monitoring: yes  Accucheck frequency: Daily Taking Insulin?: no Blood Pressure Monitoring: not checking Retinal Examination: Not up to Date Foot Exam: Up to Date Diabetic Education: Completed Pneumovax: Up to Date Influenza: Up to Date Aspirin: yes  Relevant past medical, surgical, family and social history reviewed and updated as indicated. Interim medical history since our last visit reviewed. Allergies and medications reviewed and updated.  Review of Systems  Constitutional: Negative.   Respiratory: Negative.    Cardiovascular: Negative.   Gastrointestinal: Negative.   Musculoskeletal: Negative.   Neurological: Negative.   Psychiatric/Behavioral: Negative.      Per HPI unless specifically indicated above     Objective:    BP 137/82   Pulse 76   Temp 97.8 F (36.6 C)   Wt (!) 319 lb 3.2 oz (144.8 kg)   SpO2 98%   BMI 42.12 kg/m   Wt Readings from Last 3 Encounters:  01/03/22 (!) 319 lb 3.2 oz (144.8 kg)  11/28/21 (!) 317 lb 12.8 oz (144.2 kg)  09/13/21 (!) 316 lb 9.6 oz (143.6 kg)    Physical Exam Vitals and nursing note reviewed.  Constitutional:      General: He is not in acute distress.    Appearance: Normal appearance. He is obese. He is not ill-appearing, toxic-appearing or diaphoretic.  HENT:     Head: Normocephalic and atraumatic.     Right Ear: External ear normal.     Left Ear: External ear normal.     Nose: Nose normal.     Mouth/Throat:     Mouth: Mucous membranes are  moist.     Pharynx: Oropharynx is clear.  Eyes:     General: No scleral icterus.       Right eye: No discharge.        Left eye: No discharge.     Extraocular Movements: Extraocular movements intact.     Conjunctiva/sclera: Conjunctivae normal.     Pupils: Pupils are equal, round, and reactive to light.  Cardiovascular:     Rate and Rhythm: Normal rate and regular rhythm.     Pulses: Normal pulses.     Heart sounds: Normal heart sounds. No murmur heard.    No friction rub. No gallop.  Pulmonary:     Effort: Pulmonary effort is normal. No respiratory distress.     Breath sounds: Normal breath sounds. No stridor. No wheezing, rhonchi or rales.  Chest:     Chest wall: No tenderness.  Musculoskeletal:        General: Normal range of motion.     Cervical back: Normal range of motion and neck supple.  Skin:    General: Skin is warm and dry.     Capillary Refill: Capillary refill takes less than 2 seconds.     Coloration: Skin is not jaundiced or pale.     Findings: No bruising, erythema, lesion or  rash.  Neurological:     General: No focal deficit present.     Mental Status: He is alert and oriented to person, place, and time. Mental status is at baseline.  Psychiatric:        Mood and Affect: Mood normal.        Behavior: Behavior normal.        Thought Content: Thought content normal.        Judgment: Judgment normal.     Results for orders placed or performed in visit on 09/13/21  Comprehensive metabolic panel  Result Value Ref Range   Glucose 186 (H) 70 - 99 mg/dL   BUN 13 6 - 24 mg/dL   Creatinine, Ser 0.74 (L) 0.76 - 1.27 mg/dL   eGFR 106 >59 mL/min/1.73   BUN/Creatinine Ratio 18 9 - 20   Sodium 140 134 - 144 mmol/L   Potassium 4.6 3.5 - 5.2 mmol/L   Chloride 101 96 - 106 mmol/L   CO2 20 20 - 29 mmol/L   Calcium 9.1 8.7 - 10.2 mg/dL   Total Protein 6.8 6.0 - 8.5 g/dL   Albumin 4.0 3.8 - 4.9 g/dL   Globulin, Total 2.8 1.5 - 4.5 g/dL   Albumin/Globulin Ratio 1.4  1.2 - 2.2   Bilirubin Total 0.3 0.0 - 1.2 mg/dL   Alkaline Phosphatase 104 44 - 121 IU/L   AST 45 (H) 0 - 40 IU/L   ALT 45 (H) 0 - 44 IU/L  CBC with Differential/Platelet  Result Value Ref Range   WBC 8.5 3.4 - 10.8 x10E3/uL   RBC 5.61 4.14 - 5.80 x10E6/uL   Hemoglobin 17.0 13.0 - 17.7 g/dL   Hematocrit 50.7 37.5 - 51.0 %   MCV 90 79 - 97 fL   MCH 30.3 26.6 - 33.0 pg   MCHC 33.5 31.5 - 35.7 g/dL   RDW 12.8 11.6 - 15.4 %   Platelets 210 150 - 450 x10E3/uL   Neutrophils 41 Not Estab. %   Lymphs 41 Not Estab. %   Monocytes 11 Not Estab. %   Eos 5 Not Estab. %   Basos 1 Not Estab. %   Neutrophils Absolute 3.5 1.4 - 7.0 x10E3/uL   Lymphocytes Absolute 3.5 (H) 0.7 - 3.1 x10E3/uL   Monocytes Absolute 0.9 0.1 - 0.9 x10E3/uL   EOS (ABSOLUTE) 0.4 0.0 - 0.4 x10E3/uL   Basophils Absolute 0.1 0.0 - 0.2 x10E3/uL   Immature Granulocytes 1 Not Estab. %   Immature Grans (Abs) 0.1 0.0 - 0.1 x10E3/uL  Bayer DCA Hb A1c Waived  Result Value Ref Range   HB A1C (BAYER DCA - WAIVED) 7.8 (H) 4.8 - 5.6 %  Lipid Panel w/o Chol/HDL Ratio  Result Value Ref Range   Cholesterol, Total 122 100 - 199 mg/dL   Triglycerides 120 0 - 149 mg/dL   HDL 47 >39 mg/dL   VLDL Cholesterol Cal 21 5 - 40 mg/dL   LDL Chol Calc (NIH) 54 0 - 99 mg/dL  Microalbumin, Urine Waived  Result Value Ref Range   Microalb, Ur Waived 10 0 - 19 mg/L   Creatinine, Urine Waived 100 10 - 300 mg/dL   Microalb/Creat Ratio <30 <30 mg/g  Urinalysis, Routine w reflex microscopic  Result Value Ref Range   Specific Gravity, UA 1.025 1.005 - 1.030   pH, UA 5.5 5.0 - 7.5   Color, UA Yellow Yellow   Appearance Ur Clear Clear   Leukocytes,UA Negative Negative   Protein,UA Negative Negative/Trace  Glucose, UA Trace (A) Negative   Ketones, UA Negative Negative   RBC, UA Negative Negative   Bilirubin, UA Negative Negative   Urobilinogen, Ur 1.0 0.2 - 1.0 mg/dL   Nitrite, UA Negative Negative      Assessment & Plan:   Problem List  Items Addressed This Visit       Endocrine   Diabetes mellitus associated with hormonal etiology ([place]) - Primary    Better with A1c of 7.5 down from 7.8, but still high. Will really work on diet and discussed he can take XR metformin all in the AM with increased side effects. Continue ozempic. Recheck 3 months.       Relevant Medications   metFORMIN (GLUCOPHAGE-XR) 500 MG 24 hr tablet   Other Relevant Orders   Bayer DCA Hb A1c Waived     Follow up plan: Return in about 3 months (around 04/05/2022) for physical.

## 2022-01-03 NOTE — Assessment & Plan Note (Signed)
Better with A1c of 7.5 down from 7.8, but still high. Will really work on diet and discussed he can take XR metformin all in the AM with increased side effects. Continue ozempic. Recheck 3 months.

## 2022-01-06 DIAGNOSIS — L97512 Non-pressure chronic ulcer of other part of right foot with fat layer exposed: Secondary | ICD-10-CM | POA: Diagnosis not present

## 2022-01-06 DIAGNOSIS — L97522 Non-pressure chronic ulcer of other part of left foot with fat layer exposed: Secondary | ICD-10-CM | POA: Diagnosis not present

## 2022-01-07 ENCOUNTER — Ambulatory Visit: Payer: BC Managed Care – PPO | Admitting: Family Medicine

## 2022-01-20 DIAGNOSIS — L97511 Non-pressure chronic ulcer of other part of right foot limited to breakdown of skin: Secondary | ICD-10-CM | POA: Diagnosis not present

## 2022-01-20 DIAGNOSIS — E1142 Type 2 diabetes mellitus with diabetic polyneuropathy: Secondary | ICD-10-CM | POA: Diagnosis not present

## 2022-02-03 DIAGNOSIS — E1142 Type 2 diabetes mellitus with diabetic polyneuropathy: Secondary | ICD-10-CM | POA: Diagnosis not present

## 2022-02-03 DIAGNOSIS — L97512 Non-pressure chronic ulcer of other part of right foot with fat layer exposed: Secondary | ICD-10-CM | POA: Diagnosis not present

## 2022-02-03 DIAGNOSIS — L03115 Cellulitis of right lower limb: Secondary | ICD-10-CM | POA: Diagnosis not present

## 2022-02-17 DIAGNOSIS — E1142 Type 2 diabetes mellitus with diabetic polyneuropathy: Secondary | ICD-10-CM | POA: Diagnosis not present

## 2022-02-17 DIAGNOSIS — L97512 Non-pressure chronic ulcer of other part of right foot with fat layer exposed: Secondary | ICD-10-CM | POA: Diagnosis not present

## 2022-02-18 ENCOUNTER — Other Ambulatory Visit: Payer: Self-pay | Admitting: Podiatry

## 2022-02-20 ENCOUNTER — Encounter
Admission: RE | Admit: 2022-02-20 | Discharge: 2022-02-20 | Disposition: A | Discharge status: Home / Self Care | Payer: PRIVATE HEALTH INSURANCE | Source: Ambulatory Visit | Attending: Podiatry | Admitting: Podiatry

## 2022-02-20 ENCOUNTER — Other Ambulatory Visit: Payer: Self-pay

## 2022-02-20 VITALS — Ht 72.0 in | Wt 316.0 lb

## 2022-02-20 DIAGNOSIS — Z01812 Encounter for preprocedural laboratory examination: Secondary | ICD-10-CM

## 2022-02-20 DIAGNOSIS — I10 Essential (primary) hypertension: Secondary | ICD-10-CM

## 2022-02-20 DIAGNOSIS — Z01810 Encounter for preprocedural cardiovascular examination: Secondary | ICD-10-CM

## 2022-02-20 DIAGNOSIS — E1169 Type 2 diabetes mellitus with other specified complication: Secondary | ICD-10-CM

## 2022-02-20 HISTORY — DX: Obesity, unspecified: E66.9

## 2022-02-20 NOTE — Progress Notes (Signed)
  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 Samara Deist, DPM. In review of his medication reconciliation list, it was noted that patient is taking a prescribed injectable GLP-1 medication. Recently, the [employer] of Anesthesiology (ASA) has released guidelines advising against the use of this medication class during the perioperative period.   In the case of an elective procedure, guidelines dictate that injectable GLP-1 medications should be held 7 full days prior to the procedure. These medications induce delayed gastric emptying, which significantly increases the risk of this patient experiencing aspiration during his perioperative course.   Additionally, patient is on metformin. This medication needs to be held for 2  days prior to elective surgery per [facility] anesthesia guidelines. Patient still taking medication as of 02/20/2022.  Communicated with primary attending surgeon's office to make them aware that case will need to be postponed pending an appropriate hold period of 7 days for his (semaglutide) and 2 days (metformin). Surgeon's office asked to reach out to patient to discuss rescheduling this case. Patient has received verbal/written instructions regarding need to hold the  aforementioned medications. Case to be removed from the OR board by the surgeon's office.   Honor Loh, MSN, APRN, FNP-C, CEN [facility]  Peri-operative Services Nurse Practitioner Phone: [phone] 02/20/22 12:50 PM  NOTE: This note has been prepared using Dragon dictation software. Despite my best ability to proofread, there is always the potential that unintentional transcriptional errors may still occur from this process.

## 2022-02-20 NOTE — Patient Instructions (Addendum)
Your procedure is scheduled on: Dr Alvera Singh office will contact you Report to the Registration Desk on the 1st floor of the [facility]. To find out your arrival time, please call [phone] between 1PM - 3PM on: the day before your surgery If your arrival time is 6:00 am, do not arrive prior to that time as the [facility] entrance doors do not open until 6:00 am.  REMEMBER: Instructions that are not followed completely may result in serious medical risk, up to and including death; or upon the discretion of your surgeon and anesthesiologist your surgery may need to be rescheduled.  Do not eat food after midnight the night before surgery.  No gum chewing, lozengers or hard candies.  You may however, drink CLEAR liquids up to 2 hours before you are scheduled to arrive for your surgery. Do not drink anything within 2 hours of your scheduled arrival time.  Clear liquids include: - water   In addition, your doctor has ordered for you to drink the provided  Gatorade G2 Drinking this carbohydrate drink up to two hours before surgery helps to reduce insulin resistance and improve patient outcomes. Please complete drinking 2 hours prior to scheduled arrival time.  TAKE THESE MEDICATIONS THE MORNING OF SURGERY WITH A SIP OF WATER: atorvastatin  No metformin for two days before surgery. No semaglutide for one week before surgery.  One week prior to surgery: Stop Anti-inflammatories (NSAIDS) such as Advil, Aleve, Ibuprofen, Motrin, Naproxen, Naprosyn and Aspirin based products such as Excedrin, Goodys Powder, BC Powder. Stop ANY OVER THE COUNTER supplements until after surgery. You may however, continue to take Tylenol if needed for pain up until the day of surgery.  No Alcohol for 24 hours before or after surgery.  No Smoking including e-cigarettes for 24 hours prior to surgery.  No chewable tobacco products for at least 6 hours prior to surgery.  No nicotine patches on the day of  surgery.  Do not use any "recreational" drugs for at least a week prior to your surgery.  Please be advised that the combination of cocaine and anesthesia may have negative outcomes, up to and including death. If you test positive for cocaine, your surgery will be cancelled.  On the morning of surgery brush your teeth with toothpaste and water, you may rinse your mouth with mouthwash if you wish. Do not swallow any toothpaste or mouthwash.  Use CHG Soap as directed on instruction sheet.  Do not wear jewelry, make-up, hairpins, clips or nail polish.  Do not wear lotions, powders, or perfumes or deodorant or cologne   Do not shave body from the neck down 48 hours prior to surgery just in case you cut yourself which could leave a site for infection.  Also, freshly shaved skin may become irritated if using the CHG soap.  Contact lenses, hearing aids and dentures may not be worn into surgery.  Do not bring valuables to the hospital. [facility] is not responsible for any missing/lost belongings or valuables.   Notify your doctor if there is any change in your medical condition (cold, fever, infection).  Wear comfortable clothing (specific to your surgery type) to the hospital.  After surgery, you can help prevent lung complications by doing breathing exercises.  Take deep breaths and cough every 1-2 hours. Your doctor may order a device called an Incentive Spirometer to help you take deep breaths. When coughing or sneezing, hold a pillow firmly against your incision with both hands. This is  called "splinting." Doing this helps protect your incision. It also decreases belly discomfort.  If you are being discharged the day of surgery, you will not be allowed to drive home. You will need a responsible adult (18 years or older) to drive you home and stay with you that night.   If you are taking public transportation, you will need to have a responsible adult (18 years or older) with  you. Please confirm with your physician that it is acceptable to use public transportation.   Please call the [facility] Dept. at [phone] if you have any questions about these instructions.  Surgery Visitation Policy:  Patients undergoing a surgery or procedure may have two family members or support persons with them as long as the person is not COVID-19 positive or experiencing its symptoms.

## 2022-02-21 ENCOUNTER — Encounter
Admission: RE | Admit: 2022-02-21 | Discharge: 2022-02-21 | Disposition: A | Discharge status: Home / Self Care | Payer: BC Managed Care – PPO | Source: Ambulatory Visit | Attending: Podiatry | Admitting: Podiatry

## 2022-02-21 DIAGNOSIS — I10 Essential (primary) hypertension: Secondary | ICD-10-CM | POA: Insufficient documentation

## 2022-02-21 DIAGNOSIS — Z01810 Encounter for preprocedural cardiovascular examination: Secondary | ICD-10-CM | POA: Diagnosis not present

## 2022-02-28 LAB — HM DIABETES EYE EXAM

## 2022-03-03 DIAGNOSIS — L03115 Cellulitis of right lower limb: Secondary | ICD-10-CM | POA: Diagnosis not present

## 2022-03-03 DIAGNOSIS — E1142 Type 2 diabetes mellitus with diabetic polyneuropathy: Secondary | ICD-10-CM | POA: Diagnosis not present

## 2022-03-03 DIAGNOSIS — L97512 Non-pressure chronic ulcer of other part of right foot with fat layer exposed: Secondary | ICD-10-CM | POA: Diagnosis not present

## 2022-03-06 MED ORDER — SODIUM CHLORIDE 0.9 % IV SOLN: INTRAVENOUS | Status: DC

## 2022-03-06 MED ORDER — CEFAZOLIN SODIUM-DEXTROSE 2-4 GM/100ML-% IV SOLN
2.0000 g | INTRAVENOUS | Status: AC
  Administered 2022-03-07: 3 g via INTRAVENOUS

## 2022-03-07 ENCOUNTER — Ambulatory Visit: Payer: BC Managed Care – PPO | Admitting: Urgent Care

## 2022-03-07 ENCOUNTER — Encounter: Payer: Self-pay | Admitting: Podiatry

## 2022-03-07 ENCOUNTER — Ambulatory Visit: Payer: BC Managed Care – PPO

## 2022-03-07 ENCOUNTER — Encounter
Admission: RE | Disposition: A | Discharge status: Home / Self Care | Payer: Self-pay | Source: Home / Self Care | Attending: Podiatry

## 2022-03-07 ENCOUNTER — Other Ambulatory Visit: Payer: Self-pay

## 2022-03-07 ENCOUNTER — Ambulatory Visit: Payer: BC Managed Care – PPO | Admitting: Anesthesiology

## 2022-03-07 ENCOUNTER — Ambulatory Visit
Admission: RE | Admit: 2022-03-07 | Discharge: 2022-03-07 | Disposition: A | Discharge status: Home / Self Care | Payer: BC Managed Care – PPO | Attending: Podiatry | Admitting: Podiatry

## 2022-03-07 DIAGNOSIS — E669 Obesity, unspecified: Secondary | ICD-10-CM | POA: Insufficient documentation

## 2022-03-07 DIAGNOSIS — E1165 Type 2 diabetes mellitus with hyperglycemia: Secondary | ICD-10-CM | POA: Insufficient documentation

## 2022-03-07 DIAGNOSIS — L97512 Non-pressure chronic ulcer of other part of right foot with fat layer exposed: Secondary | ICD-10-CM | POA: Diagnosis not present

## 2022-03-07 DIAGNOSIS — I10 Essential (primary) hypertension: Secondary | ICD-10-CM | POA: Diagnosis not present

## 2022-03-07 DIAGNOSIS — E11621 Type 2 diabetes mellitus with foot ulcer: Secondary | ICD-10-CM | POA: Diagnosis not present

## 2022-03-07 DIAGNOSIS — Z87891 Personal history of nicotine dependence: Secondary | ICD-10-CM | POA: Diagnosis not present

## 2022-03-07 DIAGNOSIS — Z419 Encounter for procedure for purposes other than remedying health state, unspecified: Secondary | ICD-10-CM

## 2022-03-07 DIAGNOSIS — M24574 Contracture, right foot: Secondary | ICD-10-CM | POA: Diagnosis not present

## 2022-03-07 DIAGNOSIS — M86171 Other acute osteomyelitis, right ankle and foot: Secondary | ICD-10-CM | POA: Diagnosis not present

## 2022-03-07 DIAGNOSIS — M898X7 Other specified disorders of bone, ankle and foot: Secondary | ICD-10-CM | POA: Diagnosis not present

## 2022-03-07 DIAGNOSIS — G4730 Sleep apnea, unspecified: Secondary | ICD-10-CM | POA: Diagnosis not present

## 2022-03-07 DIAGNOSIS — Z01812 Encounter for preprocedural laboratory examination: Secondary | ICD-10-CM

## 2022-03-07 DIAGNOSIS — E1142 Type 2 diabetes mellitus with diabetic polyneuropathy: Secondary | ICD-10-CM | POA: Insufficient documentation

## 2022-03-07 DIAGNOSIS — E1169 Type 2 diabetes mellitus with other specified complication: Secondary | ICD-10-CM

## 2022-03-07 DIAGNOSIS — M205X1 Other deformities of toe(s) (acquired), right foot: Secondary | ICD-10-CM | POA: Diagnosis not present

## 2022-03-07 HISTORY — PX: SESMOIDECTOMY: SHX6205

## 2022-03-07 HISTORY — PX: WOUND DEBRIDEMENT: SHX247

## 2022-03-07 HISTORY — PX: ARTHRODESIS METATARSALPHALANGEAL JOINT (MTPJ): SHX6566

## 2022-03-07 LAB — GLUCOSE, CAPILLARY
Glucose-Capillary: 189 mg/dL — ABNORMAL HIGH (ref 70–99)
Glucose-Capillary: 156 mg/dL — ABNORMAL HIGH (ref 70–99)

## 2022-03-07 SURGERY — SESMOIDECTOMY
Anesthesia: General | Site: Toe | Laterality: Right

## 2022-03-07 MED ORDER — KETOROLAC TROMETHAMINE 30 MG/ML IJ SOLN
INTRAMUSCULAR | Status: AC
  Filled 2022-03-07: qty 1

## 2022-03-07 MED ORDER — LIDOCAINE HCL (PF) 2 % IJ SOLN
INTRAMUSCULAR | Status: AC
  Filled 2022-03-07: qty 5

## 2022-03-07 MED ORDER — LIDOCAINE HCL (PF) 1 % IJ SOLN
INTRAMUSCULAR | Status: DC | PRN
  Administered 2022-03-07: 10 mL

## 2022-03-07 MED ORDER — SUGAMMADEX SODIUM 500 MG/5ML IV SOLN
INTRAVENOUS | Status: DC | PRN
  Administered 2022-03-07: 286.6 mg via INTRAVENOUS

## 2022-03-07 MED ORDER — ORAL CARE MOUTH RINSE: 15.0000 mL | Freq: Once | OROMUCOSAL | Status: AC

## 2022-03-07 MED ORDER — OXYCODONE HCL 5 MG PO TABS: 5.0000 mg | ORAL_TABLET | Freq: Once | ORAL | Status: DC | PRN

## 2022-03-07 MED ORDER — PROPOFOL 10 MG/ML IV BOLUS
INTRAVENOUS | Status: AC
  Filled 2022-03-07: qty 20

## 2022-03-07 MED ORDER — ONDANSETRON HCL 4 MG/2ML IJ SOLN
INTRAMUSCULAR | Status: AC
  Filled 2022-03-07: qty 2

## 2022-03-07 MED ORDER — BUPIVACAINE HCL (PF) 0.5 % IJ SOLN
INTRAMUSCULAR | Status: AC
  Filled 2022-03-07: qty 30

## 2022-03-07 MED ORDER — FENTANYL CITRATE (PF) 100 MCG/2ML IJ SOLN
INTRAMUSCULAR | Status: AC
  Filled 2022-03-07: qty 2

## 2022-03-07 MED ORDER — ROCURONIUM BROMIDE 100 MG/10ML IV SOLN
INTRAVENOUS | Status: DC | PRN
  Administered 2022-03-07 (×2): 10 mg via INTRAVENOUS
  Administered 2022-03-07: 60 mg via INTRAVENOUS

## 2022-03-07 MED ORDER — EPHEDRINE 5 MG/ML INJ
INTRAVENOUS | Status: AC
  Filled 2022-03-07: qty 5

## 2022-03-07 MED ORDER — FENTANYL CITRATE (PF) 100 MCG/2ML IJ SOLN: 25.0000 ug | INTRAMUSCULAR | Status: DC | PRN

## 2022-03-07 MED ORDER — ACETAMINOPHEN 10 MG/ML IV SOLN
INTRAVENOUS | Status: DC | PRN
  Administered 2022-03-07: 1000 mg via INTRAVENOUS

## 2022-03-07 MED ORDER — CEFAZOLIN SODIUM-DEXTROSE 2-4 GM/100ML-% IV SOLN
INTRAVENOUS | Status: AC
  Filled 2022-03-07: qty 100

## 2022-03-07 MED ORDER — CHLORHEXIDINE GLUCONATE 0.12 % MT SOLN: 15.0000 mL | Freq: Once | OROMUCOSAL | Status: AC

## 2022-03-07 MED ORDER — ACETAMINOPHEN 10 MG/ML IV SOLN: 1000.0000 mg | Freq: Once | INTRAVENOUS | Status: DC | PRN

## 2022-03-07 MED ORDER — ONDANSETRON HCL 4 MG/2ML IJ SOLN
INTRAMUSCULAR | Status: DC | PRN
  Administered 2022-03-07: 4 mg via INTRAVENOUS

## 2022-03-07 MED ORDER — MIDAZOLAM HCL 2 MG/2ML IJ SOLN
INTRAMUSCULAR | Status: AC
  Filled 2022-03-07: qty 2

## 2022-03-07 MED ORDER — SODIUM CHLORIDE 0.9 % IR SOLN
Status: DC | PRN
  Administered 2022-03-07: 1000 mL

## 2022-03-07 MED ORDER — MIDAZOLAM HCL 2 MG/2ML IJ SOLN
INTRAMUSCULAR | Status: DC | PRN
  Administered 2022-03-07: 1 mg via INTRAVENOUS

## 2022-03-07 MED ORDER — OXYCODONE HCL 5 MG/5ML PO SOLN: 5.0000 mg | Freq: Once | ORAL | Status: DC | PRN

## 2022-03-07 MED ORDER — LIDOCAINE HCL (CARDIAC) PF 100 MG/5ML IV SOSY
PREFILLED_SYRINGE | INTRAVENOUS | Status: DC | PRN
  Administered 2022-03-07: 100 mg via INTRAVENOUS

## 2022-03-07 MED ORDER — ONDANSETRON HCL 4 MG/2ML IJ SOLN: 4.0000 mg | Freq: Once | INTRAMUSCULAR | Status: DC | PRN

## 2022-03-07 MED ORDER — SUGAMMADEX SODIUM 500 MG/5ML IV SOLN
INTRAVENOUS | Status: AC
  Filled 2022-03-07: qty 5

## 2022-03-07 MED ORDER — PROPOFOL 10 MG/ML IV BOLUS
INTRAVENOUS | Status: DC | PRN
  Administered 2022-03-07: 200 mg via INTRAVENOUS
  Administered 2022-03-07: 40 mg via INTRAVENOUS

## 2022-03-07 MED ORDER — FENTANYL CITRATE (PF) 100 MCG/2ML IJ SOLN
INTRAMUSCULAR | Status: DC | PRN
  Administered 2022-03-07: 100 ug via INTRAVENOUS

## 2022-03-07 MED ORDER — 0.9 % SODIUM CHLORIDE (POUR BTL) OPTIME
TOPICAL | Status: DC | PRN
  Administered 2022-03-07 (×3): 500 mL

## 2022-03-07 MED ORDER — ACETAMINOPHEN 10 MG/ML IV SOLN
INTRAVENOUS | Status: AC
  Filled 2022-03-07: qty 100

## 2022-03-07 MED ORDER — CHLORHEXIDINE GLUCONATE 0.12 % MT SOLN
OROMUCOSAL | Status: AC
  Administered 2022-03-07: 15 mL via OROMUCOSAL
  Filled 2022-03-07: qty 15

## 2022-03-07 MED ORDER — FAMOTIDINE 20 MG PO TABS
ORAL_TABLET | ORAL | Status: AC
  Administered 2022-03-07: 20 mg
  Filled 2022-03-07: qty 1

## 2022-03-07 SURGICAL SUPPLY — 67 items
ANCH SUT 1 SHRT SM RGD INSRTR (Anchor) ×2 IMPLANT
ANCH SUT 2-0 MN NDL DRL PLSTR (Anchor) IMPLANT
ANCHOR JUGGERKNOT 1.0 1DR 2-0 (Anchor) IMPLANT
ANCHOR SUT 1.45 SZ 1 SHORT (Anchor) IMPLANT
BIT DRILL 2 FENESTRATED (MISCELLANEOUS) IMPLANT
BIT DRILL CANNULTD 2.6 X 130MM (DRILL) IMPLANT
BIT DRILLL 2 FENESTRATED (MISCELLANEOUS) ×2
BLADE OSC/SAGITTAL MD 9X18.5 (BLADE) IMPLANT
BLADE SURG 15 STRL LF DISP TIS (BLADE) ×4 IMPLANT
BLADE SURG 15 STRL SS (BLADE) ×4
BLADE SURG MINI STRL (BLADE) ×2 IMPLANT
BNDG CMPR 75X21 PLY HI ABS (MISCELLANEOUS) ×2
BNDG CMPR STD VLCR NS LF 5.8X4 (GAUZE/BANDAGES/DRESSINGS) ×2
BNDG ELASTIC 4X5.8 VLCR NS LF (GAUZE/BANDAGES/DRESSINGS) ×2 IMPLANT
BNDG ESMARK 4X12 TAN STRL LF (GAUZE/BANDAGES/DRESSINGS) ×2 IMPLANT
BNDG GAUZE DERMACEA FLUFF 4 (GAUZE/BANDAGES/DRESSINGS) ×2 IMPLANT
BNDG GZE 12X3 1 PLY HI ABS (GAUZE/BANDAGES/DRESSINGS) ×2
BNDG GZE DERMACEA 4 6PLY (GAUZE/BANDAGES/DRESSINGS) ×2
BNDG STRETCH GAUZE 3IN X12FT (GAUZE/BANDAGES/DRESSINGS) ×2 IMPLANT
BOOT STEPPER DURA LG (SOFTGOODS) IMPLANT
COUNTERSINK CANN HDLS 4.0 (ORTHOPEDIC DISPOSABLE SUPPLIES) ×2
CUFF TOURN SGL QUICK 12 (TOURNIQUET CUFF) IMPLANT
CUFF TOURN SGL QUICK 18X4 (TOURNIQUET CUFF) IMPLANT
Countersink, 4.0mm, headless IMPLANT
DRILL CANNULATED 2.6 X 130MM (DRILL) ×2
DURAPREP 26ML APPLICATOR (WOUND CARE) ×2 IMPLANT
ELECT REM PT RETURN 9FT ADLT (ELECTROSURGICAL) ×2
ELECTRODE REM PT RTRN 9FT ADLT (ELECTROSURGICAL) ×2 IMPLANT
GAUZE SPONGE 4X4 12PLY STRL (GAUZE/BANDAGES/DRESSINGS) ×2 IMPLANT
GAUZE STRETCH 2X75IN STRL (MISCELLANEOUS) ×2 IMPLANT
GAUZE XEROFORM 1X8 LF (GAUZE/BANDAGES/DRESSINGS) ×2 IMPLANT
GLOVE BIO SURGEON STRL SZ7.5 (GLOVE) ×2 IMPLANT
GLOVE SURG UNDER LTX SZ8 (GLOVE) ×2 IMPLANT
GOWN STRL REUS W/ TWL XL LVL3 (GOWN DISPOSABLE) ×4 IMPLANT
GOWN STRL REUS W/TWL XL LVL3 (GOWN DISPOSABLE) ×4
HANDPIECE VERSAJET DEBRIDEMENT (MISCELLANEOUS) IMPLANT
K-WIRE SNGL END 1.2X150 (MISCELLANEOUS) ×2
KIT TURNOVER KIT A (KITS) ×2 IMPLANT
KWIRE SNGL END 1.2X150 (MISCELLANEOUS) IMPLANT
LABEL OR SOLS (LABEL) ×2 IMPLANT
MANIFOLD NEPTUNE II (INSTRUMENTS) ×2 IMPLANT
NDL FILTER BLUNT 18X1 1/2 (NEEDLE) ×2 IMPLANT
NDL HYPO 25X1 1.5 SAFETY (NEEDLE) ×6 IMPLANT
NEEDLE FILTER BLUNT 18X1 1/2 (NEEDLE) ×2 IMPLANT
NEEDLE HYPO 25X1 1.5 SAFETY (NEEDLE) ×6 IMPLANT
NS IRRIG 500ML POUR BTL (IV SOLUTION) ×2 IMPLANT
PACK EXTREMITY ARMC (MISCELLANEOUS) ×2 IMPLANT
PAD CAST 4YDX4 CTTN HI CHSV (CAST SUPPLIES) ×2 IMPLANT
PADDING CAST COTTON 4X4 STRL (CAST SUPPLIES) ×2
SCREW CANN HDLS LT 4.0X44 (Screw) IMPLANT
SCREW CNTRSNK CANN HDLS 4.0 (ORTHOPEDIC DISPOSABLE SUPPLIES) IMPLANT
STOCKINETTE M/LG 89821 (MISCELLANEOUS) ×2 IMPLANT
STRIP CLOSURE SKIN 1/4X4 (GAUZE/BANDAGES/DRESSINGS) ×2 IMPLANT
SUT ETHILON 3-0 FS-10 30 BLK (SUTURE) ×2
SUT MNCRL 4-0 (SUTURE) ×2
SUT MNCRL 4-0 27XMFL (SUTURE) ×2
SUT VIC AB 3-0 SH 27 (SUTURE) ×2
SUT VIC AB 3-0 SH 27X BRD (SUTURE) IMPLANT
SUT VIC AB 4-0 FS2 27 (SUTURE) ×2 IMPLANT
SUT VIC AB 4-0 SH 27 (SUTURE) ×2
SUT VIC AB 4-0 SH 27XANBCTRL (SUTURE) IMPLANT
SUTURE EHLN 3-0 FS-10 30 BLK (SUTURE) IMPLANT
SUTURE MNCRL 4-0 27XMF (SUTURE) IMPLANT
SYR 10ML LL (SYRINGE) ×4 IMPLANT
TRAP FLUID SMOKE EVACUATOR (MISCELLANEOUS) ×2 IMPLANT
WATER STERILE IRR 500ML POUR (IV SOLUTION) ×2 IMPLANT
WIRE Z .062 C-WIRE SPADE TIP (WIRE) ×2 IMPLANT

## 2022-03-07 NOTE — Anesthesia Procedure Notes (Signed)
Procedure Name: Intubation Date/Time: 03/07/2022 12:07 PM  Performed by: Demetrius Charity, CRNAPre-anesthesia Checklist: Patient identified, Patient being monitored, Timeout performed, Emergency Drugs available and Suction available Patient Re-evaluated:Patient Re-evaluated prior to induction Oxygen Delivery Method: Circle system utilized Preoxygenation: Pre-oxygenation with 100% oxygen Induction Type: IV induction Ventilation: Mask ventilation without difficulty and Oral airway inserted - appropriate to patient size Laryngoscope Size: McGraph and 4 Grade View: Grade I Tube type: Oral Tube size: 7.0 mm Number of attempts: 1 Airway Equipment and Method: Stylet Placement Confirmation: ETT inserted through vocal cords under direct vision, positive ETCO2 and breath sounds checked- equal and bilateral Secured at: 23 cm Tube secured with: Tape Dental Injury: Teeth and Oropharynx as per pre-operative assessment

## 2022-03-07 NOTE — H&P (Signed)
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  The various methods of treatment have been discussed with the patient.  No guarantees were given.  After consideration of risks, benefits and other options for treatment, the patient has consented to surgery.  I have reviewed the patients' chart and labs.     A history and physical examination was performed in my office.  The patient was reexamined.  There have been no changes to this history and physical examination.  Samara Deist A

## 2022-03-07 NOTE — Discharge Instructions (Addendum)
[facility]  POST OPERATIVE INSTRUCTIONS FOR DR. Vickki Muff AND DR. Largo   Take your medication as prescribed.  Pain medication should be taken only as needed.  Keep the dressing clean, dry and intact for the next 48 hours.  Then begin your regular dressing changes.  Keep your foot elevated above the heart level for the first 48 hours.  Walking to the bathroom and brief periods of walking are acceptable, unless we have instructed you to be non-weight bearing.  Always wear your post-op shoe when walking.  Always use your crutches if you are to be non-weight bearing.  Do not take a shower. Baths are permissible as long as the foot is kept out of the water.   Every hour you are awake:  Bend your knee 15 times.  Call [facility] [phone]) if any of the following problems occur: You develop a temperature or fever. The bandage becomes saturated with blood. Medication does not stop your pain. Injury of the foot occurs. Any symptoms of infection including redness, odor, or red streaks running from wound.  AMBULATORY SURGERY  DISCHARGE INSTRUCTIONS   The drugs that you were given will stay in your system until tomorrow so for the next 24 hours you should not:  Drive an automobile Make any legal decisions Drink any alcoholic beverage   You may resume regular meals tomorrow.  Today it is better to start with liquids and gradually work up to solid foods.  You may eat anything you prefer, but it is better to start with liquids, then soup and crackers, and gradually work up to solid foods.   Please notify your doctor immediately if you have any unusual bleeding, trouble breathing, redness and pain at the surgery site, drainage, fever, or pain not relieved by medication.    Additional Instructions:        Please contact your physician with any problems or Same Day Surgery at [phone],  Monday through Friday 6 am to 4 pm, or Luke at [facility] number at [phone].

## 2022-03-07 NOTE — OR Nursing (Signed)
Cody Burke REP for [employer]

## 2022-03-07 NOTE — Transfer of Care (Signed)
Immediate Anesthesia Transfer of Care Note  Patient: [name]  Procedure(s) Performed: SESMOIDECTOMY (Right: Foot) 64680 - JONES TENOSUSPENSION (Right: Toe) DEBRIDEMENT OF RIGHT FOOT ULCER (Right: Foot)  Patient Location: PACU  Anesthesia Type:General  Level of Consciousness: awake and alert   Airway & Oxygen Therapy: Patient Spontanous Breathing and Patient connected to face mask oxygen  Post-op Assessment: Report given to RN and Post -op Vital signs reviewed and stable  Post vital signs: Reviewed and stable  Last Vitals:  Vitals Value Taken Time  BP 144/90 03/07/22 1423  Temp    Pulse 78 03/07/22 1429  Resp 15 03/07/22 1429  SpO2 96 % 03/07/22 1429  Vitals shown include unvalidated device data.  Last Pain:  Vitals:   03/07/22 1059  TempSrc: Temporal  PainSc: 0-No pain      Patients Stated Pain Goal: 0 (32/12/24 8250)  Complications: No notable events documented.

## 2022-03-07 NOTE — Anesthesia Postprocedure Evaluation (Signed)
Anesthesia Post Note  Patient: Hermenegildo Clausen  Procedure(s) Performed: SESMOIDECTOMY (Right: Foot) 21308 - JONES TENOSUSPENSION (Right: Toe) DEBRIDEMENT OF RIGHT FOOT ULCER (Right: Foot)  Patient location during evaluation: PACU Anesthesia Type: General Level of consciousness: awake and alert Pain management: pain level controlled Vital Signs Assessment: post-procedure vital signs reviewed and stable Respiratory status: spontaneous breathing, nonlabored ventilation, respiratory function stable and patient connected to nasal cannula oxygen Cardiovascular status: blood pressure returned to baseline and stable Postop Assessment: no apparent nausea or vomiting Anesthetic complications: no   No notable events documented.   Last Vitals:  Vitals:   03/07/22 1445 03/07/22 1500  BP: (!) 136/93 135/89  Pulse: 80 73  Resp: 17 16  Temp:  (!) 36.3 C  SpO2: 93% 94%    Last Pain:  Vitals:   03/07/22 1500  TempSrc:   PainSc: 0-No pain                 Arita Miss

## 2022-03-07 NOTE — Op Note (Signed)
Operative note   Surgeon:Iracema Lanagan Lawyer: None    Preop diagnosis: Nonhealing ulcer right first MTPJ    Postop diagnosis: Same    Procedure: 1.  Jones tenosuspension right forefoot 2.  Fibular sesamoidectomy plantar right foot 3.  Metatarsophalangeal joint release right first MTPJ 4.  Full-thickness excisional debridement of ulcer plantar right first MTPJ 5.  Intraoperative fluoroscopy use right foot    EBL: Minimal    Anesthesia:local and general.  Local consisted of a total of 5 cc of 0.5% bupivacaine and 5 cc of 1% lidocaine plain    Hemostasis: Mid calf tourniquet inflated to 200 mmHg for 105 minutes    Specimen: Fibular sesamoid for pathology    Complications: None    Operative indications:Cody Burke is an 56 y.o. that presents today for surgical intervention.  The risks/benefits/alternatives/complications have been discussed and consent has been given.    Procedure:  Patient was brought into the OR and placed on the operating table in thesupine position. After anesthesia was obtained theright lower extremity was prepped and draped in usual sterile fashion.  Attention was initially directed to the right foot where a longitudinal incision was made along the first metatarsal distal to the interphalangeal joint of the great toe.  Sharp and blunt dissection carried down to the capsule.  At this time the long extensor tendon to the hallux was transected at the level of the interphalangeal joint.  The head of the proximal phalanx and base of the distal phalanx were exposed.  All articular cartilage was excised.  Flat cuts were created at the base of the distal phalanx and distal proximal phalanx.  The joint was fenestrated with a 2.0 mm drill bit.  Next guidewire for the 4 oh cannulated headless screw from the [employer] screw set was placed through the base of the distal phalanx exiting the tip of the toe and then retrograded back to the proximal phalanx.  An appropriate 4 oh  cannulated headless screw was placed with good compression and stability noted.  Good alignment was noted with use of fluoroscopy in all planes.  At this time attention was directed to the dorsal first metatarsal where the extensor hallucis longus tendon was retracted out distally in the first metatarsal dorsiflex.  The tendon was marked.  Next a 1.5 mm juggernaut all suture bone anchor was placed into the dorsal first metatarsal and the tendon was sewed into the dorsal first metatarsal with the appropriate suture.  Good stability and alignment was noted at this time.  There was still noted to be dorsal contracture slightly of the first MTPJ.  A dorsal medial and lateral capsulotomy was then performed to place the great toe in a more neutral position.  Good alignment was noted at this time.  All wounds were flushed appropriately.  The excess tendon was transected.  Closure was performed with a 3-0 Vicryl for the deeper and subcutaneous tissue layers, 4-0 Vicryl for the subcutaneous tissue and a 4-0 Monocryl for the skin.  The small stab incision on the distal tip of the toe was closed with a 3-0 nylon.  Attention was then directed to the plantar aspect where the large full-thickness ulcer was noted.  Initially full-thickness debridement of the ulceration down to the level of the tendon was then performed with a Versajet.  Good tissue excision was noted with good healthy bleeding tissue residual.  Debridement was down to and including the tendinous area.  The predebridement measurements were 2.5 x  2.5 x 0.5 cm with postdebridement measurements at 2.7 x 2.7 x 0.7 cm.  All nonviable tissue was removed.  At this time with use of fluoroscopy deep to the ulcer the fibular sesamoid was noted.  The decision was made to excise the fibular sesamoid at this time.  The long flexor tendon was noted and retracted throughout.  At this time the fibular sesamoid was noted and transected away and dissected away with a 15 blade.   This was removed.  This was sent for pathological examination at this time.  Fluoroscopy revealed good removal of the fibular sesamoid at this time.  The wound was flushed with copious amounts of irrigation.  Some of the proximal and distal aspect of the plantar incision was then closed with a 3-0 Vicryl for the deeper tissue and nylon for the skin.  The ulcerative site was left open for secondary closure.  Final fluoroscopy revealed removal of the fibular sesamoid completely with good alignment of the great toe and good compression of the interphalangeal joint from the hardware.  A bulky sterile dressing was then applied to the right foot.  He was placed in a close walker boot for nonweightbearing.    Patient tolerated the procedure and anesthesia well.  Was transported from the OR to the PACU with all vital signs stable and vascular status intact. To be discharged per routine protocol.  Will follow up in approximately 1 week in the outpatient clinic.

## 2022-03-07 NOTE — Anesthesia Preprocedure Evaluation (Addendum)
Anesthesia Evaluation  Patient identified by MRN, date of birth, ID band Patient awake    Reviewed: Allergy & Precautions, NPO status , Patient's Chart, lab work & pertinent test results  History of Anesthesia Complications Negative for: history of anesthetic complications  Airway Mallampati: III  TM Distance: >3 FB Neck ROM: Full    Dental no notable dental hx. (+) Teeth Intact   Pulmonary sleep apnea , neg COPD, Patient abstained from smoking.Not current smoker, former smoker,    Pulmonary exam normal breath sounds clear to auscultation       Cardiovascular Exercise Tolerance: Good METShypertension, Pt. on medications + Peripheral Vascular Disease  (-) CAD and (-) Past MI (-) dysrhythmias  Rhythm:Regular Rate:Normal - Systolic murmurs    Neuro/Psych negative neurological ROS  negative psych ROS   GI/Hepatic neg GERD  ,(+)     (-) substance abuse  ,   Endo/Other  diabetes, Poorly Controlled, Type 2Morbid obesityOn ozempic, held for 8 days  Renal/GU negative Renal ROS     Musculoskeletal   Abdominal (+) + obese,   Peds  Hematology   Anesthesia Other Findings Past Medical History: No date: Diabetes mellitus without complication (HCC) No date: Hyperlipidemia No date: Hypertension No date: Obesity No date: Peripheral vascular disease (HCC) No date: Sleep apnea     Comment:  severe - new DX - has not gotten CPAP yet  Reproductive/Obstetrics                            Anesthesia Physical Anesthesia Plan  ASA: 3  Anesthesia Plan: General   Post-op Pain Management: Ofirmev IV (intra-op)* and Toradol IV (intra-op)*   Induction: Intravenous  PONV Risk Score and Plan: 3 and Ondansetron, Dexamethasone and Midazolam  Airway Management Planned: Oral ETT  Additional Equipment: None  Intra-op Plan:   Post-operative Plan: Extubation in OR  Informed Consent: I have reviewed the  patients History and Physical, chart, labs and discussed the procedure including the risks, benefits and alternatives for the proposed anesthesia with the patient or authorized representative who has indicated his/her understanding and acceptance.     Dental advisory given  Plan Discussed with: CRNA and Surgeon  Anesthesia Plan Comments: (Discussed risks of anesthesia with patient, including PONV, sore throat, lip/dental/eye damage. Rare risks discussed as well, such as cardiorespiratory and neurological sequelae, and allergic reactions. Discussed the role of CRNA in patient's perioperative care. Patient understands. Patient informed about increased incidence of above perioperative risk due to high BMI. Patient understands. )        Anesthesia Quick Evaluation

## 2022-03-11 ENCOUNTER — Encounter: Payer: Self-pay | Admitting: Podiatry

## 2022-03-11 LAB — SURGICAL PATHOLOGY

## 2022-03-20 DIAGNOSIS — L97512 Non-pressure chronic ulcer of other part of right foot with fat layer exposed: Secondary | ICD-10-CM | POA: Diagnosis not present

## 2022-04-02 DIAGNOSIS — L97512 Non-pressure chronic ulcer of other part of right foot with fat layer exposed: Secondary | ICD-10-CM | POA: Diagnosis not present

## 2022-04-11 ENCOUNTER — Encounter: Payer: Self-pay | Admitting: Family Medicine

## 2022-04-11 ENCOUNTER — Ambulatory Visit (INDEPENDENT_AMBULATORY_CARE_PROVIDER_SITE_OTHER): Payer: BC Managed Care – PPO | Admitting: Family Medicine

## 2022-04-11 VITALS — BP 132/76 | HR 79 | Temp 98.5°F | Wt 316.7 lb

## 2022-04-11 DIAGNOSIS — E1169 Type 2 diabetes mellitus with other specified complication: Secondary | ICD-10-CM

## 2022-04-11 DIAGNOSIS — Z23 Encounter for immunization: Secondary | ICD-10-CM

## 2022-04-11 DIAGNOSIS — E7800 Pure hypercholesterolemia, unspecified: Secondary | ICD-10-CM | POA: Diagnosis not present

## 2022-04-11 DIAGNOSIS — Z0000 Encounter for general adult medical examination without abnormal findings: Secondary | ICD-10-CM | POA: Diagnosis not present

## 2022-04-11 DIAGNOSIS — I10 Essential (primary) hypertension: Secondary | ICD-10-CM | POA: Diagnosis not present

## 2022-04-11 LAB — URINALYSIS, ROUTINE W REFLEX MICROSCOPIC
Bilirubin, UA: NEGATIVE
Ketones, UA: NEGATIVE
Leukocytes,UA: NEGATIVE
Nitrite, UA: NEGATIVE
Protein,UA: NEGATIVE
RBC, UA: NEGATIVE
Specific Gravity, UA: 1.02 (ref 1.005–1.030)
Urobilinogen, Ur: 1 mg/dL (ref 0.2–1.0)
pH, UA: 5.5 (ref 5.0–7.5)

## 2022-04-11 LAB — MICROALBUMIN, URINE WAIVED
Creatinine, Urine Waived: 200 mg/dL (ref 10–300)
Microalb, Ur Waived: 30 mg/L — ABNORMAL HIGH (ref 0–19)
Microalb/Creat Ratio: <30 mg/g (ref <30)

## 2022-04-11 LAB — BAYER DCA HB A1C WAIVED: HB A1C (BAYER DCA - WAIVED): 8.9 % — ABNORMAL HIGH (ref 4.8–5.6)

## 2022-04-11 MED ORDER — METFORMIN HCL ER 500 MG PO TB24: 1000.0000 mg | ORAL_TABLET | Freq: Two times a day (BID) | ORAL | 1 refills | Status: DC

## 2022-04-11 MED ORDER — BENAZEPRIL HCL 40 MG PO TABS: 40.0000 mg | ORAL_TABLET | Freq: Every day | ORAL | 1 refills | Status: DC

## 2022-04-11 MED ORDER — DAPAGLIFLOZIN PROPANEDIOL 10 MG PO TABS: ORAL_TABLET | ORAL | 3 refills | Status: DC

## 2022-04-11 MED ORDER — ATORVASTATIN CALCIUM 20 MG PO TABS: 20.0000 mg | ORAL_TABLET | Freq: Every day | ORAL | 1 refills | Status: DC

## 2022-04-11 NOTE — Progress Notes (Signed)
BP 132/76 (BP Location: Left Arm, Cuff Size: Large)   Pulse 79   Temp 98.5 F (36.9 C) (Oral)   Wt (!) 316 lb 11.2 oz (143.7 kg)   SpO2 99%   BMI 42.95 kg/m    Subjective:    Patient ID: Cody Burke, male    DOB: [birth year], 56 y.o.   MRN: [id number]  HPI: Cody Burke is a 56 y.o. male presenting on 04/11/2022 for comprehensive medical examination. Current medical complaints include:  DIABETES Hypoglycemic episodes:no Polydipsia/polyuria: no Visual disturbance: no Chest pain: no Paresthesias: no Glucose Monitoring: yes  Accucheck frequency: Daily- has been running high Taking Insulin?: no Blood Pressure Monitoring: not checking Retinal Examination: Up to Date Foot Exam: Up to Date Diabetic Education: Completed Pneumovax: Up to Date Influenza: Up to Date Aspirin: yes  HYPERTENSION / HYPERLIPIDEMIA Satisfied with current treatment? yes Duration of hypertension: chronic BP monitoring frequency: not checking BP medication side effects: no Past BP meds: benazepril Duration of hyperlipidemia: chronic Cholesterol medication side effects: no Cholesterol supplements: none Past cholesterol medications: atorvastatin Medication compliance: excellent compliance Aspirin: yes Recent stressors: no Recurrent headaches: no Visual changes: no Palpitations: no Dyspnea: no Chest pain: no Lower extremity edema: no Dizzy/lightheaded: no  Interim Problems from his last visit:  foot surgery- still recovering  Depression Screen done today and results listed below:     04/11/2022    8:07 AM 01/03/2022   11:20 AM 11/28/2021    1:51 PM 09/13/2021    8:27 AM 03/14/2021    8:12 AM  Depression screen PHQ 2/9  Decreased Interest 0 0 0 0 0  Down, Depressed, Hopeless 0 0 0 0 0  PHQ - 2 Score 0 0 0 0 0  Altered sleeping 0 0 0 0 1  Tired, decreased energy 0 0 0 1 0  Change in appetite 0 0 0 1 0  Feeling bad or failure about yourself  0 0 0 0 0  Trouble concentrating 0 0 0 0 0  Moving  slowly or fidgety/restless 0 0 0 0 0  Suicidal thoughts 0 0 0 0 0  PHQ-9 Score 0 0 0 2 1  Difficult doing work/chores Not difficult at all  Not difficult at all      Past Medical History:  Past Medical History:  Diagnosis Date   Diabetes mellitus without complication ([place])    Hyperlipidemia    Hypertension    Obesity    Peripheral vascular disease ([place])    Sleep apnea    severe - new DX - has not gotten CPAP yet    Surgical History:  Past Surgical History:  Procedure Laterality Date   ARTHRODESIS METATARSALPHALANGEAL JOINT (MTPJ) Right 03/07/2022   Procedure: [id number] Shauna Hugh;  Surgeon: Samara Deist, DPM;  Location: [facility];  Service: Podiatry;  Laterality: Right;   COLONOSCOPY WITH PROPOFOL N/A 09/21/2015   Procedure: COLONOSCOPY WITH PROPOFOL;  Surgeon: Lucilla Lame, MD;  Location: [place];  Service: Endoscopy;  Laterality: N/A;  Diabetic - oral meds sleep apnea   SESMOIDECTOMY Right 03/07/2022   Procedure: SESMOIDECTOMY;  Surgeon: Samara Deist, DPM;  Location: [facility];  Service: Podiatry;  Laterality: Right;   VASCULAR SURGERY Bilateral    legs   WOUND DEBRIDEMENT Right 03/07/2022   Procedure: DEBRIDEMENT OF RIGHT FOOT ULCER;  Surgeon: Samara Deist, DPM;  Location: [facility];  Service: Podiatry;  Laterality: Right;    Medications:  Current Outpatient Medications on File Prior to Visit  Medication Sig  aspirin EC 81 MG tablet Take 81 mg by mouth daily.   fluticasone (FLONASE) 50 MCG/ACT nasal spray Place into both nostrils.   glucose blood test strip Please dispense insurance preference. Use to check BS BID   Insulin Pen Needle (PEN NEEDLES) 31G X 8 MM MISC 1 each by Does not apply route once a week.   Multiple Vitamins-Minerals (MULTIVITAMIN GUMMIES ADULT PO) Take by mouth daily.   Omega-3 Fatty Acids (FISH OIL) 1000 MG CAPS Take 1 mg by mouth daily.   No current facility-administered medications on file prior to visit.    Allergies:   No Known Allergies  Social History:  Social History   Socioeconomic History   Marital status: Unknown    Spouse name: Not on file   Number of children: Not on file   Years of education: Not on file   Highest education level: Not on file  Occupational History   Not on file  Tobacco Use   Smoking status: Former    Types: Cigarettes    Quit date: 07/30/2003    Years since quitting: 18.7   Smokeless tobacco: Never  Vaping Use   Vaping Use: Never used  Substance and Sexual Activity   Alcohol use: Yes    Comment: very little   Drug use: No   Sexual activity: Not Currently  Other Topics Concern   Not on file  Social History Narrative   Not on file   Social Determinants of Health   Financial Resource Strain: Not on file  Food Insecurity: Not on file  Transportation Needs: Not on file  Physical Activity: Not on file  Stress: Not on file  Social Connections: Not on file  Intimate Partner Violence: Not on file   Social History   Tobacco Use  Smoking Status Former   Types: Cigarettes   Quit date: 07/30/2003   Years since quitting: 18.7  Smokeless Tobacco Never   Social History   Substance and Sexual Activity  Alcohol Use Yes   Comment: very little    Family History:  Family History  Problem Relation Age of Onset   Cancer Mother    Hypertension Father     Past medical history, surgical history, medications, allergies, family history and social history reviewed with patient today and changes made to appropriate areas of the chart.   Review of Systems  Constitutional: Negative.   HENT: Negative.    Eyes: Negative.   Respiratory: Negative.    Cardiovascular:  Positive for leg swelling. Negative for chest pain, palpitations, orthopnea, claudication and PND.  Gastrointestinal: Negative.   Genitourinary: Negative.   Musculoskeletal:  Positive for myalgias. Negative for back pain, falls, joint pain and neck pain.  Skin: Negative.   Neurological: Negative.    Endo/Heme/Allergies:  Positive for polydipsia. Negative for environmental allergies. Does not bruise/bleed easily.  Psychiatric/Behavioral: Negative.     All other ROS negative except what is listed above and in the HPI.      Objective:    BP 132/76 (BP Location: Left Arm, Cuff Size: Large)   Pulse 79   Temp 98.5 F (36.9 C) (Oral)   Wt (!) 316 lb 11.2 oz (143.7 kg)   SpO2 99%   BMI 42.95 kg/m   Wt Readings from Last 3 Encounters:  04/11/22 (!) 316 lb 11.2 oz (143.7 kg)  03/07/22 (!) 316 lb (143.3 kg)  02/20/22 (!) 316 lb (143.3 kg)    Physical Exam Vitals and nursing note reviewed.  Constitutional:  General: He is not in acute distress.    Appearance: Normal appearance. He is obese. He is not ill-appearing, toxic-appearing or diaphoretic.  HENT:     Head: Normocephalic and atraumatic.     Right Ear: Tympanic membrane, ear canal and external ear normal. There is no impacted cerumen.     Left Ear: Tympanic membrane, ear canal and external ear normal. There is no impacted cerumen.     Nose: Nose normal. No congestion or rhinorrhea.     Mouth/Throat:     Mouth: Mucous membranes are moist.     Pharynx: Oropharynx is clear. No oropharyngeal exudate or posterior oropharyngeal erythema.  Eyes:     General: No scleral icterus.       Right eye: No discharge.        Left eye: No discharge.     Extraocular Movements: Extraocular movements intact.     Conjunctiva/sclera: Conjunctivae normal.     Pupils: Pupils are equal, round, and reactive to light.  Neck:     Vascular: No carotid bruit.  Cardiovascular:     Rate and Rhythm: Normal rate and regular rhythm.     Pulses: Normal pulses.     Heart sounds: No murmur heard.    No friction rub. No gallop.  Pulmonary:     Effort: Pulmonary effort is normal. No respiratory distress.     Breath sounds: Normal breath sounds. No stridor. No wheezing, rhonchi or rales.  Chest:     Chest wall: No tenderness.  Abdominal:     General:  Abdomen is flat. Bowel sounds are normal. There is no distension.     Palpations: Abdomen is soft. There is no mass.     Tenderness: There is no abdominal tenderness. There is no right CVA tenderness, left CVA tenderness, guarding or rebound.     Hernia: No hernia is present.  Genitourinary:    Comments: Genital exam deferred with shared decision making Musculoskeletal:        General: No swelling, tenderness, deformity or signs of injury.     Cervical back: Normal range of motion and neck supple. No rigidity. No muscular tenderness.     Right lower leg: No edema.     Left lower leg: No edema.  Lymphadenopathy:     Cervical: No cervical adenopathy.  Skin:    General: Skin is warm and dry.     Capillary Refill: Capillary refill takes less than 2 seconds.     Coloration: Skin is not jaundiced or pale.     Findings: No bruising, erythema, lesion or rash.  Neurological:     General: No focal deficit present.     Mental Status: He is alert and oriented to person, place, and time.     Cranial Nerves: No cranial nerve deficit.     Sensory: No sensory deficit.     Motor: No weakness.     Coordination: Coordination normal.     Gait: Gait normal.     Deep Tendon Reflexes: Reflexes normal.  Psychiatric:        Mood and Affect: Mood normal.        Behavior: Behavior normal.        Thought Content: Thought content normal.        Judgment: Judgment normal.     Results for orders placed or performed during the hospital encounter of 03/07/22  Glucose, capillary  Result Value Ref Range   Glucose-Capillary 189 (H) 70 - 99 mg/dL  Glucose, capillary  Result Value Ref Range   Glucose-Capillary 156 (H) 70 - 99 mg/dL  Surgical pathology  Result Value Ref Range   SURGICAL PATHOLOGY      SURGICAL PATHOLOGY CASE: [id number] PATIENT: Eldred Caul Surgical Pathology Report     Specimen Submitted: A. Sesmoid, right  Clinical History: L97.512-ulcer of right foot with fat layer  exposed, E11.42 - type 2 diabetes mellitus with diabetic polyneuropathy, without long term current use of insulin      DIAGNOSIS: A.  RIGHT FOOT, "SESAMOID BONE"; AMPUTATION: - BONE AND ARTICULAR CARTILAGE WITH ACUTE OSTEOMYELITIS.   GROSS DESCRIPTION: A. Labeled: Right sesamoid, possible osteomyelitis Received: Fresh Collection time: 1:51 PM on 03/07/2022 Placed into formalin time: 2:49 PM on 03/07/2022 Tissue fragment(s): 2 Size: 1.8 x 1.4 x 0.8 cm and 1.2 x 0.5 x 0.3 cm Description: Received are 2 fragments of tan firm bone with surrounding tan soft tissue.  Each fragment has a roughened aspect which is inked green and blue, respectively.  Sectioning reveals tan soft tissue and tan-yellow, firm, and focally fragmented bone. Entirely submitted in cassettes 1-3 wit h the larger fragment in cassettes 1-2 and the smaller fragment in cassette 3, all following decalcification.  RB 03/10/2022  Final Diagnosis performed by Tomasa Blase, MD.   Electronically signed 03/11/2022 12:15:18PM The electronic signature indicates that the named Attending Pathologist has evaluated the specimen Technical component performed at [facility], [address] [place], [place] [address] Lab: [phone] Dir: Rush Farmer, MD, MMM  Professional component performed at [facility], [facility], [place], [place], [place] [address] Lab: [phone] Dir: Kathi Simpers, MD       Assessment & Plan:   Problem List Items Addressed This Visit       Cardiovascular and Mediastinum   Hypertension    Under good control on current regimen. Continue current regimen. Continue to monitor. Call with any concerns. Refills given. Labs drawn today.        Relevant Medications   atorvastatin (LIPITOR) 20 MG tablet   benazepril (LOTENSIN) 40 MG tablet     Endocrine   Diabetes mellitus associated with hormonal etiology ([place])    A1c up at 8.9- would like to stop ozempic and restart  farxiga. Will continue metformin and recheck 3 months. Call with any concerns.       Relevant Medications   atorvastatin (LIPITOR) 20 MG tablet   benazepril (LOTENSIN) 40 MG tablet   metFORMIN (GLUCOPHAGE-XR) 500 MG 24 hr tablet   dapagliflozin propanediol (FARXIGA) 10 MG TABS tablet   Other Relevant Orders   Bayer DCA Hb A1c Waived     Other   Hyperlipidemia    Under good control on current regimen. Continue current regimen. Continue to monitor. Call with any concerns. Refills given. Labs drawn today.       Relevant Medications   atorvastatin (LIPITOR) 20 MG tablet   benazepril (LOTENSIN) 40 MG tablet   Other Relevant Orders   Lipid Panel w/o Chol/HDL Ratio   Other Visit Diagnoses     Routine general medical examination at a health care facility    -  Primary   Vaccines up to date. Screening labs checked today. Colonoscopy up to date. Continue diet and exercise. Call with any concerns.   Relevant Orders   Comprehensive metabolic panel   CBC with Differential/Platelet   Lipid Panel w/o Chol/HDL Ratio   PSA   TSH   Urinalysis, Routine w reflex microscopic   Microalbumin, Urine Waived   Bayer  DCA Hb A1c Waived   Need for influenza vaccination       Flu shot given.   Relevant Orders   Flu Vaccine QUAD 75moIM (Fluarix, Fluzone & Alfiuria Quad PF) (Completed)        LABORATORY TESTING:  Health maintenance labs ordered today as discussed above.   The natural history of prostate cancer and ongoing controversy regarding screening and potential treatment outcomes of prostate cancer has been discussed with the patient. The meaning of a false positive PSA and a false negative PSA has been discussed. He indicates understanding of the limitations of this screening test and wishes to proceed with screening PSA testing.   IMMUNIZATIONS:   - Tdap: Tetanus vaccination status reviewed: last tetanus booster within 10 years. - Influenza: Administered today - Pneumovax: Up to date -  Prevnar: Not applicable - COVID: Up to date - HPV: Not applicable - Shingrix vaccine: Refused  SCREENING: - Colonoscopy: Up to date  Discussed with patient purpose of the colonoscopy is to detect colon cancer at curable precancerous or early stages   PATIENT COUNSELING:    Sexuality: Discussed sexually transmitted diseases, partner selection, use of condoms, avoidance of unintended pregnancy  and contraceptive alternatives.   Advised to avoid cigarette smoking.  I discussed with the patient that most people either abstain from alcohol or drink within safe limits (<=14/week and <=4 drinks/occasion for males, <=7/weeks and <= 3 drinks/occasion for females) and that the risk for alcohol disorders and other health effects rises proportionally with the number of drinks per week and how often a drinker exceeds daily limits.  Discussed cessation/primary prevention of drug use and availability of treatment for abuse.   Diet: Encouraged to adjust caloric intake to maintain  or achieve ideal body weight, to reduce intake of dietary saturated fat and total fat, to limit sodium intake by avoiding high sodium foods and not adding table salt, and to maintain adequate dietary potassium and calcium preferably from fresh fruits, vegetables, and low-fat dairy products.    stressed the importance of regular exercise  Injury prevention: Discussed safety belts, safety helmets, smoke detector, smoking near bedding or upholstery.   Dental health: Discussed importance of regular tooth brushing, flossing, and dental visits.   Follow up plan: NEXT PREVENTATIVE PHYSICAL DUE IN 1 YEAR. Return in about 3 months (around 07/12/2022).

## 2022-04-11 NOTE — Assessment & Plan Note (Signed)
Under good control on current regimen. Continue current regimen. Continue to monitor. Call with any concerns. Refills given. Labs drawn today.   

## 2022-04-11 NOTE — Assessment & Plan Note (Signed)
A1c up at 8.9- would like to stop ozempic and restart farxiga. Will continue metformin and recheck 3 months. Call with any concerns.

## 2022-04-11 NOTE — Addendum Note (Signed)
Addended by: Georgina Peer on: 04/11/2022 11:39 AM   Modules accepted: Orders

## 2022-04-12 LAB — COMPREHENSIVE METABOLIC PANEL
ALT: 44 IU/L (ref 0–44)
AST: 41 IU/L — ABNORMAL HIGH (ref 0–40)
Albumin/Globulin Ratio: 1.4 (ref 1.2–2.2)
Albumin: 4.1 g/dL (ref 3.8–4.9)
Alkaline Phosphatase: 102 IU/L (ref 44–121)
BUN/Creatinine Ratio: 12 (ref 9–20)
BUN: 11 mg/dL (ref 6–24)
Bilirubin Total: 0.5 mg/dL (ref 0.0–1.2)
CO2: 24 mmol/L (ref 20–29)
Calcium: 9.4 mg/dL (ref 8.7–10.2)
Chloride: 97 mmol/L (ref 96–106)
Creatinine, Ser: 0.91 mg/dL (ref 0.76–1.27)
Globulin, Total: 3 g/dL (ref 1.5–4.5)
Glucose: 173 mg/dL — ABNORMAL HIGH (ref 70–99)
Potassium: 4.6 mmol/L (ref 3.5–5.2)
Sodium: 136 mmol/L (ref 134–144)
Total Protein: 7.1 g/dL (ref 6.0–8.5)
eGFR: 98 mL/min/{1.73_m2} (ref >59)

## 2022-04-12 LAB — LIPID PANEL W/O CHOL/HDL RATIO
Cholesterol, Total: 125 mg/dL (ref 100–199)
HDL: 39 mg/dL — ABNORMAL LOW (ref >39)
LDL Chol Calc (NIH): 57 mg/dL (ref 0–99)
Triglycerides: 173 mg/dL — ABNORMAL HIGH (ref 0–149)
VLDL Cholesterol Cal: 29 mg/dL (ref 5–40)

## 2022-04-12 LAB — CBC WITH DIFFERENTIAL/PLATELET
Basophils Absolute: 0.1 10*3/uL (ref 0.0–0.2)
Basos: 1 %
EOS (ABSOLUTE): 0.5 10*3/uL — ABNORMAL HIGH (ref 0.0–0.4)
Eos: 5 %
Hematocrit: 49.1 % (ref 37.5–51.0)
Hemoglobin: 16.3 g/dL (ref 13.0–17.7)
Immature Grans (Abs): 0.1 10*3/uL (ref 0.0–0.1)
Immature Granulocytes: 1 %
Lymphocytes Absolute: 4.3 10*3/uL — ABNORMAL HIGH (ref 0.7–3.1)
Lymphs: 41 %
MCH: 29.7 pg (ref 26.6–33.0)
MCHC: 33.2 g/dL (ref 31.5–35.7)
MCV: 90 fL (ref 79–97)
Monocytes Absolute: 1 10*3/uL — ABNORMAL HIGH (ref 0.1–0.9)
Monocytes: 9 %
Neutrophils Absolute: 4.6 10*3/uL (ref 1.4–7.0)
Neutrophils: 43 %
Platelets: 238 10*3/uL (ref 150–450)
RBC: 5.48 x10E6/uL (ref 4.14–5.80)
RDW: 13.1 % (ref 11.6–15.4)
WBC: 10.6 10*3/uL (ref 3.4–10.8)

## 2022-04-12 LAB — TSH: TSH: 3.69 u[IU]/mL (ref 0.450–4.500)

## 2022-04-12 LAB — PSA: Prostate Specific Ag, Serum: <0.1 ng/mL (ref 0.0–4.0)

## 2022-04-25 DIAGNOSIS — L97512 Non-pressure chronic ulcer of other part of right foot with fat layer exposed: Secondary | ICD-10-CM | POA: Diagnosis not present

## 2022-05-08 ENCOUNTER — Other Ambulatory Visit: Payer: Self-pay

## 2022-05-08 ENCOUNTER — Inpatient Hospital Stay: Payer: BC Managed Care – PPO

## 2022-05-08 ENCOUNTER — Other Ambulatory Visit
Admission: RE | Admit: 2022-05-08 | Discharge: 2022-05-08 | Disposition: A | Discharge status: Home / Self Care | Payer: BC Managed Care – PPO | Source: Ambulatory Visit | Attending: Podiatry | Admitting: Podiatry

## 2022-05-08 ENCOUNTER — Inpatient Hospital Stay
Admission: EM | Admit: 2022-05-08 | Discharge: 2022-05-12 | DRG: 854 | Disposition: A | Discharge status: Home / Self Care | Payer: BC Managed Care – PPO | Source: Ambulatory Visit | Attending: Internal Medicine | Admitting: Internal Medicine

## 2022-05-08 DIAGNOSIS — L03115 Cellulitis of right lower limb: Secondary | ICD-10-CM | POA: Diagnosis not present

## 2022-05-08 DIAGNOSIS — L97509 Non-pressure chronic ulcer of other part of unspecified foot with unspecified severity: Secondary | ICD-10-CM | POA: Diagnosis not present

## 2022-05-08 DIAGNOSIS — M869 Osteomyelitis, unspecified: Secondary | ICD-10-CM | POA: Diagnosis not present

## 2022-05-08 DIAGNOSIS — E119 Type 2 diabetes mellitus without complications: Secondary | ICD-10-CM | POA: Diagnosis not present

## 2022-05-08 DIAGNOSIS — M8618 Other acute osteomyelitis, other site: Secondary | ICD-10-CM | POA: Diagnosis not present

## 2022-05-08 DIAGNOSIS — Z79899 Other long term (current) drug therapy: Secondary | ICD-10-CM | POA: Diagnosis not present

## 2022-05-08 DIAGNOSIS — E1142 Type 2 diabetes mellitus with diabetic polyneuropathy: Secondary | ICD-10-CM | POA: Diagnosis not present

## 2022-05-08 DIAGNOSIS — B957 Other staphylococcus as the cause of diseases classified elsewhere: Secondary | ICD-10-CM | POA: Diagnosis not present

## 2022-05-08 DIAGNOSIS — M00071 Staphylococcal arthritis, right ankle and foot: Secondary | ICD-10-CM | POA: Diagnosis not present

## 2022-05-08 DIAGNOSIS — Z8249 Family history of ischemic heart disease and other diseases of the circulatory system: Secondary | ICD-10-CM

## 2022-05-08 DIAGNOSIS — E11628 Type 2 diabetes mellitus with other skin complications: Secondary | ICD-10-CM | POA: Diagnosis not present

## 2022-05-08 DIAGNOSIS — A419 Sepsis, unspecified organism: Secondary | ICD-10-CM | POA: Insufficient documentation

## 2022-05-08 DIAGNOSIS — A411 Sepsis due to other specified staphylococcus: Principal | ICD-10-CM | POA: Diagnosis present

## 2022-05-08 DIAGNOSIS — E11621 Type 2 diabetes mellitus with foot ulcer: Secondary | ICD-10-CM

## 2022-05-08 DIAGNOSIS — I739 Peripheral vascular disease, unspecified: Secondary | ICD-10-CM | POA: Diagnosis present

## 2022-05-08 DIAGNOSIS — E1169 Type 2 diabetes mellitus with other specified complication: Secondary | ICD-10-CM

## 2022-05-08 DIAGNOSIS — Z7982 Long term (current) use of aspirin: Secondary | ICD-10-CM | POA: Diagnosis not present

## 2022-05-08 DIAGNOSIS — E7800 Pure hypercholesterolemia, unspecified: Secondary | ICD-10-CM | POA: Diagnosis not present

## 2022-05-08 DIAGNOSIS — E1151 Type 2 diabetes mellitus with diabetic peripheral angiopathy without gangrene: Secondary | ICD-10-CM | POA: Diagnosis present

## 2022-05-08 DIAGNOSIS — L089 Local infection of the skin and subcutaneous tissue, unspecified: Principal | ICD-10-CM

## 2022-05-08 DIAGNOSIS — R600 Localized edema: Secondary | ICD-10-CM | POA: Diagnosis not present

## 2022-05-08 DIAGNOSIS — R6520 Severe sepsis without septic shock: Secondary | ICD-10-CM

## 2022-05-08 DIAGNOSIS — M868X7 Other osteomyelitis, ankle and foot: Secondary | ICD-10-CM | POA: Diagnosis not present

## 2022-05-08 DIAGNOSIS — I10 Essential (primary) hypertension: Secondary | ICD-10-CM | POA: Diagnosis not present

## 2022-05-08 DIAGNOSIS — M00871 Arthritis due to other bacteria, right ankle and foot: Secondary | ICD-10-CM | POA: Diagnosis not present

## 2022-05-08 DIAGNOSIS — L97519 Non-pressure chronic ulcer of other part of right foot with unspecified severity: Secondary | ICD-10-CM | POA: Diagnosis not present

## 2022-05-08 DIAGNOSIS — Z9889 Other specified postprocedural states: Secondary | ICD-10-CM | POA: Diagnosis not present

## 2022-05-08 DIAGNOSIS — E785 Hyperlipidemia, unspecified: Secondary | ICD-10-CM | POA: Diagnosis present

## 2022-05-08 DIAGNOSIS — Z87891 Personal history of nicotine dependence: Secondary | ICD-10-CM | POA: Diagnosis not present

## 2022-05-08 DIAGNOSIS — M86171 Other acute osteomyelitis, right ankle and foot: Secondary | ICD-10-CM | POA: Diagnosis not present

## 2022-05-08 DIAGNOSIS — Z794 Long term (current) use of insulin: Secondary | ICD-10-CM | POA: Diagnosis not present

## 2022-05-08 DIAGNOSIS — L97512 Non-pressure chronic ulcer of other part of right foot with fat layer exposed: Secondary | ICD-10-CM | POA: Diagnosis not present

## 2022-05-08 DIAGNOSIS — L97516 Non-pressure chronic ulcer of other part of right foot with bone involvement without evidence of necrosis: Secondary | ICD-10-CM | POA: Diagnosis present

## 2022-05-08 DIAGNOSIS — M7731 Calcaneal spur, right foot: Secondary | ICD-10-CM | POA: Diagnosis not present

## 2022-05-08 DIAGNOSIS — Z6841 Body Mass Index (BMI) 40.0 and over, adult: Secondary | ICD-10-CM

## 2022-05-08 DIAGNOSIS — A401 Sepsis due to streptococcus, group B: Secondary | ICD-10-CM | POA: Diagnosis not present

## 2022-05-08 DIAGNOSIS — Z7984 Long term (current) use of oral hypoglycemic drugs: Secondary | ICD-10-CM | POA: Diagnosis not present

## 2022-05-08 DIAGNOSIS — L03031 Cellulitis of right toe: Secondary | ICD-10-CM | POA: Diagnosis not present

## 2022-05-08 HISTORY — DX: Severe sepsis without septic shock: A41.9

## 2022-05-08 LAB — CBC WITH DIFFERENTIAL/PLATELET
Abs Immature Granulocytes: 0.28 10*3/uL — ABNORMAL HIGH (ref 0.00–0.07)
Basophils Absolute: 0.2 10*3/uL — ABNORMAL HIGH (ref 0.0–0.1)
Basophils Relative: 1 %
Eosinophils Absolute: 0.3 10*3/uL (ref 0.0–0.5)
Eosinophils Relative: 2 %
HCT: 49.1 % (ref 39.0–52.0)
Hemoglobin: 16.1 g/dL (ref 13.0–17.0)
Immature Granulocytes: 2 %
Lymphocytes Relative: 27 %
Lymphs Abs: 4.6 10*3/uL — ABNORMAL HIGH (ref 0.7–4.0)
MCH: 29.3 pg (ref 26.0–34.0)
MCHC: 32.8 g/dL (ref 30.0–36.0)
MCV: 89.3 fL (ref 80.0–100.0)
Monocytes Absolute: 1.4 10*3/uL — ABNORMAL HIGH (ref 0.1–1.0)
Monocytes Relative: 8 %
Neutro Abs: 10.4 10*3/uL — ABNORMAL HIGH (ref 1.7–7.7)
Neutrophils Relative %: 60 %
Platelets: 311 10*3/uL (ref 150–400)
RBC: 5.5 MIL/uL (ref 4.22–5.81)
RDW: 14 % (ref 11.5–15.5)
Smear Review: NORMAL
WBC: 17.2 10*3/uL — ABNORMAL HIGH (ref 4.0–10.5)
nRBC: 0.1 % (ref 0.0–0.2)

## 2022-05-08 LAB — COMPREHENSIVE METABOLIC PANEL
ALT: 28 U/L (ref 0–44)
AST: 24 U/L (ref 15–41)
Albumin: 3.2 g/dL — ABNORMAL LOW (ref 3.5–5.0)
Alkaline Phosphatase: 96 U/L (ref 38–126)
Anion gap: 6 (ref 5–15)
BUN: 19 mg/dL (ref 6–20)
CO2: 22 mmol/L (ref 22–32)
Calcium: 9.3 mg/dL (ref 8.9–10.3)
Chloride: 107 mmol/L (ref 98–111)
Creatinine, Ser: 0.83 mg/dL (ref 0.61–1.24)
GFR, Estimated: >60 mL/min (ref >60)
Glucose, Bld: 221 mg/dL — ABNORMAL HIGH (ref 70–99)
Potassium: 4.2 mmol/L (ref 3.5–5.1)
Sodium: 135 mmol/L (ref 135–145)
Total Bilirubin: 1 mg/dL (ref 0.3–1.2)
Total Protein: 8.1 g/dL (ref 6.5–8.1)

## 2022-05-08 LAB — HIV ANTIBODY (ROUTINE TESTING W REFLEX): HIV Screen 4th Generation wRfx: NONREACTIVE

## 2022-05-08 LAB — TYPE AND SCREEN
ABO/RH(D): O POS
Antibody Screen: NEGATIVE

## 2022-05-08 LAB — LACTIC ACID, PLASMA
Lactic Acid, Venous: 2.1 mmol/L (ref 0.5–1.9)
Lactic Acid, Venous: 1.8 mmol/L (ref 0.5–1.9)

## 2022-05-08 LAB — URINALYSIS, ROUTINE W REFLEX MICROSCOPIC
Bacteria, UA: NONE SEEN
Bilirubin Urine: NEGATIVE
Glucose, UA: >=500 mg/dL — AB
Hgb urine dipstick: NEGATIVE
Ketones, ur: 5 mg/dL — AB
Leukocytes,Ua: NEGATIVE
Nitrite: NEGATIVE
Protein, ur: NEGATIVE mg/dL
Specific Gravity, Urine: 1.027 (ref 1.005–1.030)
pH: 5 (ref 5.0–8.0)

## 2022-05-08 LAB — GLUCOSE, CAPILLARY
Glucose-Capillary: 181 mg/dL — ABNORMAL HIGH (ref 70–99)
Glucose-Capillary: 208 mg/dL — ABNORMAL HIGH (ref 70–99)
Glucose-Capillary: 203 mg/dL — ABNORMAL HIGH (ref 70–99)

## 2022-05-08 LAB — PROTIME-INR
INR: 1.2 (ref 0.8–1.2)
Prothrombin Time: 15.5 seconds — ABNORMAL HIGH (ref 11.4–15.2)

## 2022-05-08 LAB — APTT: aPTT: 30 seconds (ref 24–36)

## 2022-05-08 LAB — SURGICAL PCR SCREEN
MRSA, PCR: NEGATIVE
Staphylococcus aureus: NEGATIVE

## 2022-05-08 LAB — PROCALCITONIN: Procalcitonin: 0.1 ng/mL

## 2022-05-08 LAB — SEDIMENTATION RATE: Sed Rate: 45 mm/hr — ABNORMAL HIGH (ref 0–20)

## 2022-05-08 MED ORDER — GADOBUTROL 1 MMOL/ML IV SOLN
10.0000 mL | Freq: Once | INTRAVENOUS | Status: AC | PRN
  Administered 2022-05-08: 10 mL via INTRAVENOUS

## 2022-05-08 MED ORDER — ADULT MULTIVITAMIN W/MINERALS CH
1.0000 | ORAL_TABLET | Freq: Every day | ORAL | Status: DC
  Administered 2022-05-09 – 2022-05-12 (×4): 1 via ORAL
  Filled 2022-05-08 (×4): qty 1

## 2022-05-08 MED ORDER — ASPIRIN 81 MG PO TBEC
81.0000 mg | DELAYED_RELEASE_TABLET | Freq: Every day | ORAL | Status: DC
  Administered 2022-05-09 – 2022-05-12 (×4): 81 mg via ORAL
  Filled 2022-05-08 (×4): qty 1

## 2022-05-08 MED ORDER — SODIUM CHLORIDE 0.9 % IV SOLN: INTRAVENOUS | Status: DC

## 2022-05-08 MED ORDER — OMEGA-3-ACID ETHYL ESTERS 1 G PO CAPS
1.0000 g | ORAL_CAPSULE | Freq: Every day | ORAL | Status: DC
  Administered 2022-05-09 – 2022-05-12 (×4): 1 g via ORAL
  Filled 2022-05-08 (×4): qty 1

## 2022-05-08 MED ORDER — ACETAMINOPHEN 325 MG PO TABS
650.0000 mg | ORAL_TABLET | Freq: Four times a day (QID) | ORAL | Status: DC | PRN
  Administered 2022-05-10: 650 mg via ORAL
  Filled 2022-05-08: qty 2

## 2022-05-08 MED ORDER — ATORVASTATIN CALCIUM 20 MG PO TABS
20.0000 mg | ORAL_TABLET | Freq: Every day | ORAL | Status: DC
  Administered 2022-05-09 – 2022-05-12 (×4): 20 mg via ORAL
  Filled 2022-05-08 (×4): qty 1

## 2022-05-08 MED ORDER — VANCOMYCIN HCL 1750 MG/350ML IV SOLN
1750.0000 mg | Freq: Two times a day (BID) | INTRAVENOUS | Status: DC
  Administered 2022-05-09 – 2022-05-10 (×4): 1750 mg via INTRAVENOUS
  Filled 2022-05-08 (×5): qty 350

## 2022-05-08 MED ORDER — HYDRALAZINE HCL 20 MG/ML IJ SOLN: 5.0000 mg | INTRAMUSCULAR | Status: DC | PRN

## 2022-05-08 MED ORDER — INSULIN ASPART 100 UNIT/ML IJ SOLN
0.0000 [IU] | Freq: Three times a day (TID) | INTRAMUSCULAR | Status: DC
  Administered 2022-05-08: 3 [IU] via SUBCUTANEOUS
  Administered 2022-05-08 – 2022-05-09 (×2): 2 [IU] via SUBCUTANEOUS
  Administered 2022-05-09: 7 [IU] via SUBCUTANEOUS
  Administered 2022-05-10: 3 [IU] via SUBCUTANEOUS
  Administered 2022-05-10 – 2022-05-11 (×4): 2 [IU] via SUBCUTANEOUS
  Administered 2022-05-12: 1 [IU] via SUBCUTANEOUS
  Filled 2022-05-08 (×10): qty 1

## 2022-05-08 MED ORDER — SODIUM CHLORIDE 0.9 % IV SOLN
2.0000 g | INTRAVENOUS | Status: DC
  Administered 2022-05-08: 2 g via INTRAVENOUS
  Filled 2022-05-08: qty 20

## 2022-05-08 MED ORDER — SODIUM CHLORIDE 0.9 % IV SOLN
2.0000 g | Freq: Three times a day (TID) | INTRAVENOUS | Status: DC
  Administered 2022-05-08 – 2022-05-10 (×5): 2 g via INTRAVENOUS
  Filled 2022-05-08 (×5): qty 12.5
  Filled 2022-05-08: qty 2
  Filled 2022-05-08 (×2): qty 12.5

## 2022-05-08 MED ORDER — VANCOMYCIN HCL IN DEXTROSE 1-5 GM/200ML-% IV SOLN
1000.0000 mg | Freq: Once | INTRAVENOUS | Status: AC
  Administered 2022-05-08: 1000 mg via INTRAVENOUS
  Filled 2022-05-08: qty 200

## 2022-05-08 MED ORDER — SODIUM CHLORIDE 0.9 % IV BOLUS
1000.0000 mL | Freq: Once | INTRAVENOUS | Status: AC
  Administered 2022-05-08: 1000 mL via INTRAVENOUS

## 2022-05-08 MED ORDER — VANCOMYCIN HCL 1500 MG/300ML IV SOLN
1500.0000 mg | Freq: Once | INTRAVENOUS | Status: AC
  Administered 2022-05-08: 1500 mg via INTRAVENOUS
  Filled 2022-05-08: qty 300

## 2022-05-08 MED ORDER — HEPARIN SODIUM (PORCINE) 5000 UNIT/ML IJ SOLN
5000.0000 [IU] | Freq: Three times a day (TID) | INTRAMUSCULAR | Status: DC
  Administered 2022-05-08 – 2022-05-12 (×12): 5000 [IU] via SUBCUTANEOUS
  Filled 2022-05-08 (×13): qty 1

## 2022-05-08 MED ORDER — VANCOMYCIN HCL 10 G IV SOLR: 2500.0000 mg | Freq: Once | INTRAVENOUS | Status: DC

## 2022-05-08 MED ORDER — ONDANSETRON HCL 4 MG/2ML IJ SOLN
4.0000 mg | Freq: Three times a day (TID) | INTRAMUSCULAR | Status: DC | PRN
  Filled 2022-05-08: qty 2

## 2022-05-08 MED ORDER — INSULIN ASPART 100 UNIT/ML IJ SOLN
0.0000 [IU] | Freq: Every day | INTRAMUSCULAR | Status: DC
  Administered 2022-05-08: 2 [IU] via SUBCUTANEOUS
  Administered 2022-05-09: 3 [IU] via SUBCUTANEOUS
  Filled 2022-05-08 (×2): qty 1

## 2022-05-08 MED ORDER — MUPIROCIN 2 % EX OINT
1.0000 | TOPICAL_OINTMENT | Freq: Two times a day (BID) | CUTANEOUS | Status: DC
  Administered 2022-05-08 – 2022-05-10 (×2): 1 via NASAL
  Filled 2022-05-08 (×2): qty 22

## 2022-05-08 MED ORDER — CHLORHEXIDINE GLUCONATE 4 % EX LIQD: 60.0000 mL | Freq: Once | CUTANEOUS | Status: DC

## 2022-05-08 NOTE — ED Triage Notes (Signed)
Pt with surgery on October on bottom of right foot, states it has been healing but this week had signs of infection with drainage of pus and blood, swelling in calf and toes. Culture was done on wound in [facility] office.

## 2022-05-08 NOTE — Consult Note (Signed)
PODIATRY / FOOT AND ANKLE SURGERY CONSULTATION NOTE  Requesting Physician: Dr. Blaine Hamper  Reason for consult: R foot infection  Chief Complaint: R foot wound, infection, cellulitis   HPI: Cody Burke is a 56 y.o. 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  Patient previously underwent surgical procedure with Dr. Vickki Muff consisting of sesamoidectomy, hallux arthroplasty, and Jones tenosuspension.  Patient subsequently ended up having worsening swelling and redness to the area and the joint was aspirated and was noted to have purulence and fluctuance to the area.  Patient was seen in clinic today for this issue and was subsequently sent to the hospital for admission and further workup and for possibly a partial first ray amputation depending on MRI findings.  X-ray was taken in clinic which did show osteomyelitic changes present to the joint with concern for septic joint.  Patient presents today resting in bed comfortably and has no pain to the foot at this time.  Patient does have a history of diabetes with polyneuropathy.  PMHx:  Past Medical History:  Diagnosis Date   Diabetes mellitus without complication ([place])    Hyperlipidemia    Hypertension    Obesity    Peripheral vascular disease ([place])    Sleep apnea    severe - new DX - has not gotten CPAP yet    Surgical Hx:  Past Surgical History:  Procedure Laterality Date   ARTHRODESIS METATARSALPHALANGEAL JOINT (MTPJ) Right 03/07/2022   Procedure: [id number] Shauna Hugh;  Surgeon: Samara Deist, DPM;  Location: [facility];  Service: Podiatry;  Laterality: Right;   COLONOSCOPY WITH PROPOFOL N/A 09/21/2015   Procedure: COLONOSCOPY WITH PROPOFOL;  Surgeon: Lucilla Lame, MD;  Location: [place];  Service: Endoscopy;  Laterality: N/A;  Diabetic - oral meds sleep apnea   SESMOIDECTOMY Right 03/07/2022   Procedure: SESMOIDECTOMY;  Surgeon: Samara Deist, DPM;  Location: [facility];   Service: Podiatry;  Laterality: Right;   VASCULAR SURGERY Bilateral    legs   WOUND DEBRIDEMENT Right 03/07/2022   Procedure: DEBRIDEMENT OF RIGHT FOOT ULCER;  Surgeon: Samara Deist, DPM;  Location: [facility];  Service: Podiatry;  Laterality: Right;    FHx:  Family History  Problem Relation Age of Onset   Cancer Mother    Hypertension Father     Social History:  reports that he quit smoking about 18 years ago. His smoking use included cigarettes. He has never used smokeless tobacco. He reports current alcohol use. He reports that he does not use drugs.  Allergies: No Known Allergies  (Not in a hospital admission)   Physical Exam: General: Alert and oriented.  No apparent distress.  Vascular: DP/PT pulses +1 bilateral, capillary fill time appears to be intact to digits bilaterally.  Mild edema present to bilateral lower extremities, moderate to severe edema present to the right first ray and right forefoot with associated erythema.  Neuro: Light touch sensation absent to bilateral lower extremities.   Derm: Open ulceration present to the plantar aspect of the right first metatarsal phalangeal joint that probes directly to bone, mild purulence, associated moderate to severe erythema and edema present to the joint extending proximal to the midfoot, also appears to have erythematous and edematous changes to the second and third toes of the right foot.  Appears to have a blistering type formation present over the dorsal aspect of the first metatarsal phalangeal joint.      MSK: Obvious fluctuance palpable to the first metatarsal phalangeal  joint right foot.  Results for orders placed or performed during the hospital encounter of 05/08/22 (from the past 48 hour(s))  Lactic acid, plasma     Status: Abnormal   Collection Time: 05/08/22  9:54 AM  Result Value Ref Range   Lactic Acid, Venous 2.1 (HH) 0.5 - 1.9 mmol/L    Comment: CRITICAL RESULT CALLED TO, READ BACK BY AND VERIFIED  WITH LINDA [facility] AT 1040 05/08/22 DAS Performed at [facility], [address] [place], [place] [address]   Comprehensive metabolic panel     Status: Abnormal   Collection Time: 05/08/22  9:54 AM  Result Value Ref Range   Sodium 135 135 - 145 mmol/L   Potassium 4.2 3.5 - 5.1 mmol/L   Chloride 107 98 - 111 mmol/L   CO2 22 22 - 32 mmol/L   Glucose, Bld 221 (H) 70 - 99 mg/dL    Comment: Glucose reference range applies only to samples taken after fasting for at least 8 hours.   BUN 19 6 - 20 mg/dL   Creatinine, Ser 0.83 0.61 - 1.24 mg/dL   Calcium 9.3 8.9 - 10.3 mg/dL   Total Protein 8.1 6.5 - 8.1 g/dL   Albumin 3.2 (L) 3.5 - 5.0 g/dL   AST 24 15 - 41 U/L   ALT 28 0 - 44 U/L   Alkaline Phosphatase 96 38 - 126 U/L   Total Bilirubin 1.0 0.3 - 1.2 mg/dL   GFR, Estimated >60 >60 mL/min    Comment: (NOTE) Calculated using the CKD-EPI Creatinine Equation (2021)    Anion gap 6 5 - 15    Comment: Performed at [facility], [address] [place], [place] [address]  CBC with Differential     Status: Abnormal   Collection Time: 05/08/22  9:54 AM  Result Value Ref Range   WBC 17.2 (H) 4.0 - 10.5 K/uL   RBC 5.50 4.22 - 5.81 MIL/uL   Hemoglobin 16.1 13.0 - 17.0 g/dL   HCT 49.1 39.0 - 52.0 %   MCV 89.3 80.0 - 100.0 fL   MCH 29.3 26.0 - 34.0 pg   MCHC 32.8 30.0 - 36.0 g/dL   RDW 14.0 11.5 - 15.5 %   Platelets 311 150 - 400 K/uL   nRBC 0.1 0.0 - 0.2 %   Neutrophils Relative % 60 %   Neutro Abs 10.4 (H) 1.7 - 7.7 K/uL   Lymphocytes Relative 27 %   Lymphs Abs 4.6 (H) 0.7 - 4.0 K/uL   Monocytes Relative 8 %   Monocytes Absolute 1.4 (H) 0.1 - 1.0 K/uL   Eosinophils Relative 2 %   Eosinophils Absolute 0.3 0.0 - 0.5 K/uL   Basophils Relative 1 %   Basophils Absolute 0.2 (H) 0.0 - 0.1 K/uL   RBC Morphology MORPHOLOGY UNREMARKABLE    Smear Review Normal platelet morphology    Immature Granulocytes 2 %   Abs Immature Granulocytes 0.28 (H) 0.00 - 0.07 K/uL   Reactive,  Benign Lymphocytes PRESENT    Smudge Cells PRESENT     Comment: Performed at [facility] [address] [place], [place] [address]  Lactic acid, plasma     Status: None   Collection Time: 05/08/22 11:07 AM  Result Value Ref Range   Lactic Acid, Venous 1.8 0.5 - 1.9 mmol/L    Comment: Performed at [facility], [address] [place], [place] [address]   No results found.  Blood pressure 133/80, pulse (!) 114,  temperature 98.4 F (36.9 C), temperature source Oral, resp. rate 20, height 6' (1.829 m), weight (!) 145.2 kg, SpO2 95 %.  Assessment Septic joint right first metatarsal phalangeal joint with associated diabetic foot ulceration Cellulitis right foot Diabetes type 2 polyneuropathy  Plan -Patient seen and examined. -X-ray imaging reviewed from clinic.  Appears show osteomyelitic changes to the first metatarsal phalangeal joint. -Patient's wound to the plantar aspect of the first metatarsal phalange joint probes to bone with purulent discharge and fluctuance palpable at the joint level. -Wound culture taken and sent off today. -Applied Betadine wet-to-dry dressing. -Patient should be partial weightbearing with heel contact and surgical shoe or boot. -Patient to be n.p.o. at midnight for surgical intervention tomorrow.  Patient likely needs a right partial first ray amputation with antibiotic bead application.  Would like to receive MRI prior to surgical intervention to determine extent of infection. All treatment options were discussed with the patient of both conservative and surgical attempts at correction including potential risks and complications.  Patient has elected for procedure consisting of right partial first ray amputation with antibiotic bead application.  No guarantees given.  Consent obtained. -Will see patient tomorrow to once again discuss MRI results and surgical intervention.  Plan for surgery at 3 PM tomorrow.   Caroline More,  DPM 05/08/2022, 12:47 PM

## 2022-05-08 NOTE — ED Triage Notes (Signed)
First Nurse:  Pt brought over from [facility] clinic due to a foot infection.

## 2022-05-08 NOTE — Consult Note (Signed)
Pharmacy Antibiotic Note  Cody Burke is a 56 y.o. male admitted on 05/08/2022 with  diabetic foot ulcer with osteomyelitis . Podiatry following and planning for surgical debridement 12/15. Pharmacy has been consulted for vancomycin dosing. Patient is also ordered ceftriaxone.  Chart review shows wound culture from foot on 04/25/22 which grew Enterobacter cloacae and Klebsiella pneumoniae.  Plan:  Vancomycin 2.5 g IV loading dose followed by maintenance regimen of vancomycin 1.75 g IV q12h --Calculated AUC: 519, Cmin 14.2 --Daily Scr per protocol --Levels at steady state or as clinically indicated  Height: 6' (182.9 cm) Weight: (!) 145.2 kg (320 lb) IBW/kg (Calculated) : 77.6  Temp (24hrs), Avg:98.4 F (36.9 C), Min:98.4 F (36.9 C), Max:98.4 F (36.9 C)  Recent Labs  Lab 05/08/22 0954 05/08/22 1107  WBC 17.2*  --   CREATININE 0.83  --   LATICACIDVEN 2.1* 1.8    Estimated Creatinine Clearance: 143.5 mL/min (by C-G formula based on SCr of 0.83 mg/dL).    No Known Allergies  Antimicrobials this admission: Ceftriaxone 12/14 >>  Vancomycin 12/14 >>   Dose adjustments this admission: N/A  Microbiology results: 12/14 BCx: pending  Thank you for allowing pharmacy to be a part of this patient's care.  Benita Gutter 05/08/2022 11:49 AM

## 2022-05-08 NOTE — ED Notes (Signed)
First set of blood cultures sent Xray done at [facility] today.

## 2022-05-08 NOTE — H&P (Signed)
History and Physical    Cody Burke [id number]:[id number] DOB: September 21, 1965 DOA: 05/08/2022  Referring MD/NP/PA:   PCP: Valerie Roys, DO   Patient coming from:  The patient is coming from home.    Chief Complaint: right foot ulcer with infection  HPI: Cody Burke is a 56 y.o. male with medical history significant of hypertension, hyperlipidemia, diabetes mellitus, morbid obesity with BMI of 43.4, PVD, who presents with right foot ulcer with infection.  Patient states that he has right foot plantar ulcer for more than 5 months.  Patient has been following up with Dr. Vickki Muff of podiatry. He is status post right foot surgery for chronic ulceration. He states that his right foot infection has been worsening recently. His right foot is more swollen and erythematous. Pt was seen by Dr. Vickki Muff and arthrocentesis performed in the office today. It demonstrated frank purulence. Per report, X-rays were obtained in office which revealed concerns for osteomyelitis.  He was sent to the emergency department for admission and surgical debridement tomorrow.  Patient does not have significant pain in the right foot.  No fever or chills.  Patient denies chest pain, cough, shortness breath.  No nausea, vomiting, diarrhea or abdominal pain.  No symptoms of UTI.  Data reviewed independently and ED Course: pt was found to have WBC 17.2, lactic acid 2.1, temperature 98.4, blood pressure 133/80, heart rate of 114, RR 20, oxygen saturation 95% on room air.  Patient is admitted to [place] bed as inpatient.  Dr. Luana Shu of podiatry is consulted.   EKG:  Not done in ED, will get one.      Review of Systems:   General: no fevers, chills, no body weight gain, fatigue HEENT: no blurry vision, hearing changes or sore throat Respiratory: no dyspnea, coughing, wheezing CV: no chest pain, no palpitations GI: no nausea, vomiting, abdominal pain, diarrhea, constipation GU: no dysuria, burning on urination, increased urinary  frequency, hematuria  Ext: has right foot ulcer, swelling and erythema Neuro: no unilateral weakness, numbness, or tingling, no vision change or hearing loss Skin: no rash, no skin tear. MSK: No muscle spasm, no deformity, no limitation of range of movement in spin Heme: No easy bruising.  Travel history: No recent long distant travel.   Allergy: No Known Allergies  Past Medical History:  Diagnosis Date   Diabetes mellitus without complication ([place])    Hyperlipidemia    Hypertension    Obesity    Peripheral vascular disease ([place])    Sleep apnea    severe - new DX - has not gotten CPAP yet    Past Surgical History:  Procedure Laterality Date   ARTHRODESIS METATARSALPHALANGEAL JOINT (MTPJ) Right 03/07/2022   Procedure: [address]9470 Shauna Hugh;  Surgeon: Samara Deist, DPM;  Location: [facility];  Service: Podiatry;  Laterality: Right;   COLONOSCOPY WITH PROPOFOL N/A 09/21/2015   Procedure: COLONOSCOPY WITH PROPOFOL;  Surgeon: Lucilla Lame, MD;  Location: [facility];  Service: Endoscopy;  Laterality: N/A;  Diabetic - oral meds sleep apnea   SESMOIDECTOMY Right 03/07/2022   Procedure: SESMOIDECTOMY;  Surgeon: Samara Deist, DPM;  Location: [facility];  Service: Podiatry;  Laterality: Right;   VASCULAR SURGERY Bilateral    legs   WOUND DEBRIDEMENT Right 03/07/2022   Procedure: DEBRIDEMENT OF RIGHT FOOT ULCER;  Surgeon: Samara Deist, DPM;  Location: [facility];  Service: Podiatry;  Laterality: Right;    Social History:  reports that he quit smoking about 18 years ago. His smoking use included  cigarettes. He has never used smokeless tobacco. He reports current alcohol use. He reports that he does not use drugs.  Family History:  Family History  Problem Relation Age of Onset   Cancer Mother    Hypertension Father      Prior to Admission medications   Medication Sig Start Date End Date Taking? Authorizing Provider  aspirin EC 81 MG tablet Take 81 mg by mouth daily.     [provider]  atorvastatin (LIPITOR) 20 MG tablet Take 1 tablet (20 mg total) by mouth daily. 04/11/22   Johnson, Megan P, DO  benazepril (LOTENSIN) 40 MG tablet Take 1 tablet (40 mg total) by mouth daily. 04/11/22   Johnson, Megan P, DO  dapagliflozin propanediol (FARXIGA) 10 MG TABS tablet Take 10 mg by mouth daily. 04/11/22   Johnson, Megan P, DO  fluticasone (FLONASE) 50 MCG/ACT nasal spray Place into both nostrils. 07/24/20   [provider]  glucose blood test strip Please dispense insurance preference. Use to check BS BID 11/09/19   Cannady, Jolene T, NP  Insulin Pen Needle (PEN NEEDLES) 31G X 8 MM MISC 1 each by Does not apply route once a week. 04/08/19   Johnson, Megan P, DO  metFORMIN (GLUCOPHAGE-XR) 500 MG 24 hr tablet Take 2 tablets (1,000 mg total) by mouth 2 (two) times daily. 04/11/22   Park Liter P, DO  Multiple Vitamins-Minerals (MULTIVITAMIN GUMMIES ADULT PO) Take by mouth daily.    [provider]  Omega-3 Fatty Acids (FISH OIL) 1000 MG CAPS Take 1 mg by mouth daily.    [provider]    Physical Exam: Vitals:   05/08/22 0946 05/08/22 0952  BP: 133/80   Pulse: (!) 114   Resp: 20   Temp: 98.4 F (36.9 C)   TempSrc: Oral   SpO2: 95%   Weight:  (!) 145.2 kg  Height:  6' (1.829 m)   General: Not in acute distress HEENT:       Eyes: PERRL, EOMI, no scleral icterus.       ENT: No discharge from the ears and nose, no pharynx injection, no tonsillar enlargement.        Neck: No JVD, no bruit, no mass felt. Heme: No neck lymph node enlargement. Cardiac: S1/S2, RRR, No murmurs, No gallops or rubs. Respiratory: No rales, wheezing, rhonchi or rubs. GI: Soft, nondistended, nontender, no rebound pain, no organomegaly, BS present. GU: No hematuria Ext: Has a plantar ulcer, right foot is swollen and erythematous, no significant tenderness.     Musculoskeletal: No joint deformities, No joint redness or warmth, no limitation of ROM in  spin. Skin: No rashes.  Neuro: Alert, oriented X3, cranial nerves II-XII grossly intact, moves all extremities normally. Psych: Patient is not psychotic, no suicidal or hemocidal ideation.  Labs on Admission: I have personally reviewed following labs and imaging studies  CBC: Recent Labs  Lab 05/08/22 0954  WBC 17.2*  NEUTROABS 10.4*  HGB 16.1  HCT 49.1  MCV 89.3  PLT 035   Basic Metabolic Panel: Recent Labs  Lab 05/08/22 0954  NA 135  K 4.2  CL 107  CO2 22  GLUCOSE 221*  BUN 19  CREATININE 0.83  CALCIUM 9.3   GFR: Estimated Creatinine Clearance: 143.5 mL/min (by C-G formula based on SCr of 0.83 mg/dL). Liver Function Tests: Recent Labs  Lab 05/08/22 0954  AST 24  ALT 28  ALKPHOS 96  BILITOT 1.0  PROT 8.1  ALBUMIN 3.2*  No results for input(s): "LIPASE", "AMYLASE" in the last 168 hours. No results for input(s): "AMMONIA" in the last 168 hours. Coagulation Profile: No results for input(s): "INR", "PROTIME" in the last 168 hours. Cardiac Enzymes: No results for input(s): "CKTOTAL", "CKMB", "CKMBINDEX", "TROPONINI" in the last 168 hours. BNP (last 3 results) No results for input(s): "PROBNP" in the last 8760 hours. HbA1C: No results for input(s): "HGBA1C" in the last 72 hours. CBG: No results for input(s): "GLUCAP" in the last 168 hours. Lipid Profile: No results for input(s): "CHOL", "HDL", "LDLCALC", "TRIG", "CHOLHDL", "LDLDIRECT" in the last 72 hours. Thyroid Function Tests: No results for input(s): "TSH", "T4TOTAL", "FREET4", "T3FREE", "THYROIDAB" in the last 72 hours. Anemia Panel: No results for input(s): "VITAMINB12", "FOLATE", "FERRITIN", "TIBC", "IRON", "RETICCTPCT" in the last 72 hours. Urine analysis:    Component Value Date/Time   APPEARANCEUR Clear 04/11/2022 1141   GLUCOSEU 2+ (A) 04/11/2022 1141   BILIRUBINUR Negative 04/11/2022 1141   PROTEINUR Negative 04/11/2022 1141   NITRITE Negative 04/11/2022 1141   LEUKOCYTESUR Negative  04/11/2022 1141   Sepsis Labs: _0 (procalcitonin:4,lacticidven:4) )No results found for this or any previous visit (from the past 240 hour(s)).   Radiological Exams on Admission: No results found.    Assessment/Plan Principal Problem:   Diabetic foot ulcer with osteomyelitis ([place]) Active Problems:   Severe sepsis ([place])   Hypertension   Hyperlipidemia   Diabetes mellitus without complication (HCC)   PVD (peripheral vascular disease) (HCC)   Obesity, Class III, BMI 40-49.9 (morbid obesity) ([place])   Assessment and Plan:  Severe sepsis due to diabetic right foot ulcer with osteomyelitis [facility]): pt meets criteria for severe sepsis with WBC 17.2, heart rate of 114.  Lactic acid is elevated 2.1 which has normalized to 1.8 after giving IVF in ED. Dr. Luana Shu of podiatry is planning to do I&D tomorrow.  - Admitted to MedSurg bed as inpatient - Empiric antimicrobial treatment with vancomycin and cefepime (patient received 1 dose of Rocephin which is changed to cefepime) - Blood cultures x 2  - ESR and CRP - MRI-right foot  - will get Procalcitonin - IVF: 2 L of NS bolus   Hypertension: -IV hydralazine as needed -Hold Lotensin since patient may die risk of developing hypotension due to severe sepsis  Hyperlipidemia -Lipitor  Diabetes mellitus without complication ([place]): Recent A1c 8.9, poorly controlled.  Patient taking metformin and Farxiga at home -SSI  PVD (peripheral vascular disease) ([place]) -Aspirin and Lipitor  Obesity, Class III, BMI 40-49.9 (morbid obesity) ([place]): Patient is taking Farxigar at home.  BMI 43.40, body weight 145.2 kg -Exercise and healthy diet -Encouraged to lose weight       DVT ppx: SQ Heparin     Code Status: Full code  Family Communication:    Yes, patient's wife and daughter  by phone  Disposition Plan:  Anticipate discharge back to previous environment  Consults called: Dr. Luana Shu of podiatry is consulted.   Admission status and  Level of care: Med-Surg:    as inpt      Dispo: The patient is from: Home              Anticipated d/c is to: Home              Anticipated d/c date is: 2 days              Patient currently is not medically stable to d/c.    Severity of Illness:  The appropriate patient status for this  patient is INPATIENT. Inpatient status is judged to be reasonable and necessary in order to provide the required intensity of service to ensure the patient's safety. The patient's presenting symptoms, physical exam findings, and initial radiographic and laboratory data in the context of their chronic comorbidities is felt to place them at high risk for further clinical deterioration. Furthermore, it is not anticipated that the patient will be medically stable for discharge from the hospital within 2 midnights of admission.   * I certify that at the point of admission it is my clinical judgment that the patient will require inpatient hospital care spanning beyond 2 midnights from the point of admission due to high intensity of service, high risk for further deterioration and high frequency of surveillance required.*       Date of Service 05/08/2022    Ivor Costa [facility]   If 7PM-7AM, please contact night-coverage [email or web address] 05/08/2022, 12:59 PM

## 2022-05-08 NOTE — ED Provider Notes (Signed)
[facility] Provider Note    Event Date/Time   First MD Initiated Contact with Patient 05/08/22 1015     (approximate)   History   Recurrent Skin Infections   HPI  Cody Burke is a 56 y.o. male with a past medical history of diabetes, obesity, hyperlipidemia, hypertension who presents today for evaluation of foot wound.  Patient was sent in by Dr. Vickki Muff.  He is status post right foot surgery for chronic ulceration, and presented to his office today with swelling and redness to his right forefoot that has been worsening.  Arthrocentesis performed in the office today demonstrated frank purulence to the area.  X-rays were also obtained in the office and revealed concerns for erosive changes of the first metatarsal head consistent with osteomyelitis.  He was sent to the emergency department for admission and surgical debridement tomorrow.  Patient Active Problem List   Diagnosis Date Noted   Diabetic foot ulcer with osteomyelitis ([place]) 05/08/2022   Severe sepsis ([place]) 05/08/2022   Diabetes mellitus without complication ([place]) 83/66/2947   Obesity, Class III, BMI 40-49.9 (morbid obesity) ([place]) 05/08/2022   PVD (peripheral vascular disease) ([place]) 05/08/2022   Pressure injury of right foot, stage 1 11/28/2021   Elevated hemoglobin ([place]) 03/14/2021   Morbid obesity ([place]) 04/14/2018   Sleep apnea 05/01/2016   Benign neoplasm of transverse colon    Diabetes mellitus associated with hormonal etiology ([place])    Hyperlipidemia    Hypertension           Physical Exam   Triage Vital Signs: ED Triage Vitals  Enc Vitals Group     BP 05/08/22 0946 133/80     Pulse Rate 05/08/22 0946 (!) 114     Resp 05/08/22 0946 20     Temp 05/08/22 0946 98.4 F (36.9 C)     Temp Source 05/08/22 0946 Oral     SpO2 05/08/22 0946 95 %     Weight 05/08/22 0952 (!) 320 lb (145.2 kg)     Height 05/08/22 0952 6' (1.829 m)     Head Circumference --      Peak Flow --      Pain  Score 05/08/22 0952 1     Pain Loc --      Pain Edu? --      Excl. in [place]? --     Most recent vital signs: Vitals:   05/08/22 0946 05/08/22 1314  BP: 133/80 (!) 138/90  Pulse: (!) 114 92  Resp: 20 18  Temp: 98.4 F (36.9 C) 98.6 F (37 C)  SpO2: 95% 99%    Physical Exam Vitals and nursing note reviewed.  Constitutional:      General: Awake and alert. No acute distress.    Appearance: Normal appearance. The patient is normal weight.  HENT:     Head: Normocephalic and atraumatic.     Mouth: Mucous membranes are moist.  Eyes:     General: PERRL. Normal EOMs        Right eye: No discharge.        Left eye: No discharge.     Conjunctiva/sclera: Conjunctivae normal.  Cardiovascular:     Rate and Rhythm: Normal rate and regular rhythm.     Pulses: Normal pulses.     Heart sounds: Normal heart sounds Pulmonary:     Effort: Pulmonary effort is normal. No respiratory distress.     Breath sounds: Normal breath sounds.  Abdominal:     Abdomen  is soft. There is no abdominal tenderness. No rebound or guarding. No distention. Musculoskeletal:        General: No swelling. Normal range of motion.     Cervical back: Normal range of motion and neck supple.  Right foot: 1x1cm ulcer that probes to bone with purulence to plantar aspect of right foot with surrounding erythema extending to midfoot. Significant swelling noted to great toe. No lymphangitis. No crepitus. Skin:    General: Skin is warm and dry.     Capillary Refill: Capillary refill takes less than 2 seconds.     Findings: No rash.  Neurological:     Mental Status: The patient is awake and alert.      ED Results / Procedures / Treatments   Labs (all labs ordered are listed, but only abnormal results are displayed) Labs Reviewed  LACTIC ACID, PLASMA - Abnormal; Notable for the following components:      Result Value   Lactic Acid, Venous 2.1 (*)    All other components within normal limits  COMPREHENSIVE METABOLIC  PANEL - Abnormal; Notable for the following components:   Glucose, Bld 221 (*)    Albumin 3.2 (*)    All other components within normal limits  CBC WITH DIFFERENTIAL/PLATELET - Abnormal; Notable for the following components:   WBC 17.2 (*)    Neutro Abs 10.4 (*)    Lymphs Abs 4.6 (*)    Monocytes Absolute 1.4 (*)    Basophils Absolute 0.2 (*)    Abs Immature Granulocytes 0.28 (*)    All other components within normal limits  GLUCOSE, CAPILLARY - Abnormal; Notable for the following components:   Glucose-Capillary 181 (*)    All other components within normal limits  CULTURE, BLOOD (ROUTINE X 2)  CULTURE, BLOOD (ROUTINE X 2)  AEROBIC/ANAEROBIC CULTURE W GRAM STAIN (SURGICAL/DEEP WOUND)  SURGICAL PCR SCREEN  LACTIC ACID, PLASMA  PROCALCITONIN  URINALYSIS, ROUTINE W REFLEX MICROSCOPIC  SEDIMENTATION RATE  C-REACTIVE PROTEIN  PROTIME-INR  APTT  HIV ANTIBODY (ROUTINE TESTING W REFLEX)  TYPE AND SCREEN     EKG     RADIOLOGY I independently reviewed and interpreted imaging and agree with radiologists findings.     PROCEDURES:  Critical Care performed:   .Critical Care E&M  Performed by: Marquette Old, PA-C Critical care provider statement:    Critical care time (minutes):  35   Critical care time was exclusive of:  Separately billable procedures and treating other patients   Critical care was necessary to treat or prevent imminent or life-threatening deterioration of the following conditions:  Sepsis   Critical care was time spent personally by me on the following activities:  Discussions with consultants, examination of patient, evaluation of patient's response to treatment, ordering and performing treatments and interventions, ordering and review of laboratory studies, ordering and review of radiographic studies and pulse oximetry   I assumed direction of critical care for this patient from another provider in my specialty: yes     Care discussed with: admitting  provider   After initial E/M assessment, critical care services were subsequently performed that were exclusive of separately billable procedures or treatment.      MEDICATIONS ORDERED IN ED: Medications  vancomycin (VANCOCIN) IVPB 1000 mg/200 mL premix (1,000 mg Intravenous New Bag/Given 05/08/22 1151)    Followed by  vancomycin (VANCOREADY) IVPB 1500 mg/300 mL (has no administration in time range)  ondansetron (ZOFRAN) injection 4 mg (has no administration in time range)  acetaminophen (TYLENOL)  tablet 650 mg (has no administration in time range)  hydrALAZINE (APRESOLINE) injection 5 mg (has no administration in time range)  insulin aspart (novoLOG) injection 0-9 Units (2 Units Subcutaneous Given 05/08/22 1337)  insulin aspart (novoLOG) injection 0-5 Units (has no administration in time range)  heparin injection 5,000 Units (5,000 Units Subcutaneous Given 05/08/22 1338)  vancomycin (VANCOREADY) IVPB 1750 mg/350 mL (has no administration in time range)  ceFEPIme (MAXIPIME) 2 g in sodium chloride 0.9 % 100 mL IVPB (has no administration in time range)  chlorhexidine (HIBICLENS) 4 % liquid 4 Application (has no administration in time range)  aspirin EC tablet 81 mg (has no administration in time range)  atorvastatin (LIPITOR) tablet 20 mg (has no administration in time range)  multivitamin with minerals tablet 1 tablet (has no administration in time range)  omega-3 acid ethyl esters (LOVAZA) capsule 1 g (has no administration in time range)  mupirocin ointment (BACTROBAN) 2 % 1 Application (has no administration in time range)  sodium chloride 0.9 % bolus 1,000 mL (1,000 mLs Intravenous New Bag/Given 05/08/22 1109)  sodium chloride 0.9 % bolus 1,000 mL (1,000 mLs Intravenous New Bag/Given 05/08/22 1327)     IMPRESSION / MDM / ASSESSMENT AND PLAN / ED COURSE  I reviewed the triage vital signs and the nursing notes.   Differential diagnosis includes, but is not limited to, abscess,  cellulitis, nonhealing ulcer, osteomyelitis.  Awake and alert, mildly tachycardic on arrival though normotensive and afebrile.  Patient has an ulceration to the plantar aspect of his wound and erythema with swelling to the great toe extension of erythema to his forefoot.  There is purulence from the plantar wound.    I reviewed the patient's chart.  He had a recent surgery consisting of sesamoidectomy, hallux arthroplasty and Jones tendon suspension on 03/07/2022.  He was seen in the office again today and had an arthrocentesis that revealed frank purulence and he was sent to the emergency department for admission.  X-ray obtained in clinic did demonstrate osteomyelitic changes.  Labs obtained in triage are significant for leukocytosis to 17 and a lactate to 2.1.  I discussed with Dr. Vickki Muff who requested an MRI.  Broad-spectrum antibiotics were also initiated.  All findings and recommendations were discussed with the patient and his wife who agreed with the plan.  Patient was seen by Dr. Luana Shu in the emergency room.  Patient was accepted to the hospitalist service prior to MRI result.  He declined analgesia given that he has no pain.   Patient's presentation is most consistent with acute presentation with potential threat to life or bodily function.    Clinical Course as of 05/08/22 1424  Thu May 08, 2022  1131 Discussed with Dr. Vickki Muff, he would like an MRI of the foot [JP]    Clinical Course User Index [JP] Annjeanette Sarwar, Clarnce Flock, PA-C     FINAL CLINICAL IMPRESSION(S) / ED DIAGNOSES   Final diagnoses:  Diabetic foot infection ([place])  Osteomyelitis of right foot, unspecified type ([place])     Rx / DC Orders   ED Discharge Orders     None        Note:  This document was prepared using Dragon voice recognition software and may include unintentional dictation errors.   Emeline Gins 05/08/22 1424    Blake Divine, MD 05/08/22 1540

## 2022-05-08 NOTE — Consult Note (Signed)
Brief consult note: Patient seen in my office today.  Is status post right foot surgery for chronic ulceration.  Presented with worsening swelling and redness to his right forefoot.  Arthrocentesis of the joint revealed frank purulence to the area.  X-rays in the office showed concerns for erosive changes to the first metatarsal head consistent with osteomyelitis.  Sent to the ER for admission with likely need for surgical debridement tomorrow.  Will need IV antibiotics long-term as well.  Will plan to see you upon admission and plan for surgery tomorrow.

## 2022-05-08 NOTE — Consult Note (Signed)
PHARMACY -  BRIEF ANTIBIOTIC NOTE   Pharmacy has received consult(s) for vancomycin from an ED provider.  The patient's profile has been reviewed for ht/wt/allergies/indication/available labs.    One time order(s) placed for vancomycin '2500mg'$  IV x 1  Further antibiotics/pharmacy consults should be ordered by admitting physician if indicated.                       Thank you, Alison Murray 05/08/2022  10:41 AM

## 2022-05-09 ENCOUNTER — Encounter
Admission: EM | Disposition: A | Discharge status: Home / Self Care | Payer: Self-pay | Source: Ambulatory Visit | Attending: Internal Medicine

## 2022-05-09 ENCOUNTER — Ambulatory Visit: Admission: RE | Admit: 2022-05-09 | Payer: BC Managed Care – PPO | Source: Home / Self Care | Admitting: Podiatry

## 2022-05-09 ENCOUNTER — Inpatient Hospital Stay: Payer: BC Managed Care – PPO | Admitting: Anesthesiology

## 2022-05-09 ENCOUNTER — Other Ambulatory Visit: Payer: Self-pay

## 2022-05-09 ENCOUNTER — Inpatient Hospital Stay: Payer: BC Managed Care – PPO

## 2022-05-09 DIAGNOSIS — E1169 Type 2 diabetes mellitus with other specified complication: Secondary | ICD-10-CM | POA: Diagnosis not present

## 2022-05-09 DIAGNOSIS — L97509 Non-pressure chronic ulcer of other part of unspecified foot with unspecified severity: Secondary | ICD-10-CM | POA: Diagnosis not present

## 2022-05-09 DIAGNOSIS — M869 Osteomyelitis, unspecified: Secondary | ICD-10-CM | POA: Diagnosis not present

## 2022-05-09 DIAGNOSIS — E11621 Type 2 diabetes mellitus with foot ulcer: Secondary | ICD-10-CM | POA: Diagnosis not present

## 2022-05-09 HISTORY — PX: AMPUTATION: SHX166

## 2022-05-09 LAB — CBC
HCT: 41.9 % (ref 39.0–52.0)
Hemoglobin: 13.8 g/dL (ref 13.0–17.0)
MCH: 29.6 pg (ref 26.0–34.0)
MCHC: 32.9 g/dL (ref 30.0–36.0)
MCV: 89.9 fL (ref 80.0–100.0)
Platelets: 281 10*3/uL (ref 150–400)
RBC: 4.66 MIL/uL (ref 4.22–5.81)
RDW: 14.3 % (ref 11.5–15.5)
WBC: 16.1 10*3/uL — ABNORMAL HIGH (ref 4.0–10.5)
nRBC: 0 % (ref 0.0–0.2)

## 2022-05-09 LAB — BASIC METABOLIC PANEL
Anion gap: 7 (ref 5–15)
BUN: 14 mg/dL (ref 6–20)
CO2: 20 mmol/L — ABNORMAL LOW (ref 22–32)
Calcium: 8.1 mg/dL — ABNORMAL LOW (ref 8.9–10.3)
Chloride: 108 mmol/L (ref 98–111)
Creatinine, Ser: 0.74 mg/dL (ref 0.61–1.24)
GFR, Estimated: >60 mL/min (ref >60)
Glucose, Bld: 157 mg/dL — ABNORMAL HIGH (ref 70–99)
Potassium: 3.9 mmol/L (ref 3.5–5.1)
Sodium: 135 mmol/L (ref 135–145)

## 2022-05-09 LAB — GLUCOSE, CAPILLARY
Glucose-Capillary: 151 mg/dL — ABNORMAL HIGH (ref 70–99)
Glucose-Capillary: 159 mg/dL — ABNORMAL HIGH (ref 70–99)
Glucose-Capillary: 163 mg/dL — ABNORMAL HIGH (ref 70–99)
Glucose-Capillary: 316 mg/dL — ABNORMAL HIGH (ref 70–99)
Glucose-Capillary: 291 mg/dL — ABNORMAL HIGH (ref 70–99)

## 2022-05-09 LAB — C-REACTIVE PROTEIN: CRP: 12.4 mg/dL — ABNORMAL HIGH (ref <1.0)

## 2022-05-09 LAB — VANCOMYCIN, RANDOM: Vancomycin Rm: 9 ug/mL

## 2022-05-09 SURGERY — AMPUTATION, FOOT, RAY
Anesthesia: General | Laterality: Right

## 2022-05-09 MED ORDER — SODIUM CHLORIDE 0.9 % IR SOLN
Status: DC | PRN
  Administered 2022-05-09: 3000 mL

## 2022-05-09 MED ORDER — VANCOMYCIN HCL 1000 MG IV SOLR
INTRAVENOUS | Status: AC
  Filled 2022-05-09: qty 20

## 2022-05-09 MED ORDER — FENTANYL CITRATE (PF) 100 MCG/2ML IJ SOLN
INTRAMUSCULAR | Status: AC
  Filled 2022-05-09: qty 2

## 2022-05-09 MED ORDER — ACETAMINOPHEN 10 MG/ML IV SOLN
INTRAVENOUS | Status: DC | PRN
  Administered 2022-05-09: 1000 mg via INTRAVENOUS

## 2022-05-09 MED ORDER — ONDANSETRON HCL 4 MG/2ML IJ SOLN
INTRAMUSCULAR | Status: DC | PRN
  Administered 2022-05-09: 4 mg via INTRAVENOUS

## 2022-05-09 MED ORDER — SUCCINYLCHOLINE CHLORIDE 200 MG/10ML IV SOSY
PREFILLED_SYRINGE | INTRAVENOUS | Status: AC
  Filled 2022-05-09: qty 10

## 2022-05-09 MED ORDER — ACETAMINOPHEN 10 MG/ML IV SOLN
INTRAVENOUS | Status: AC
  Filled 2022-05-09: qty 100

## 2022-05-09 MED ORDER — VANCOMYCIN HCL 1000 MG IV SOLR
INTRAVENOUS | Status: DC | PRN
  Administered 2022-05-09: 1000 mg

## 2022-05-09 MED ORDER — SUCCINYLCHOLINE CHLORIDE 200 MG/10ML IV SOSY
PREFILLED_SYRINGE | INTRAVENOUS | Status: DC | PRN
  Administered 2022-05-09: 100 mg via INTRAVENOUS

## 2022-05-09 MED ORDER — OXYCODONE HCL 5 MG/5ML PO SOLN: 5.0000 mg | Freq: Once | ORAL | Status: DC | PRN

## 2022-05-09 MED ORDER — DEXAMETHASONE SODIUM PHOSPHATE 10 MG/ML IJ SOLN
INTRAMUSCULAR | Status: DC | PRN
  Administered 2022-05-09: 5 mg via INTRAVENOUS

## 2022-05-09 MED ORDER — 0.9 % SODIUM CHLORIDE (POUR BTL) OPTIME
TOPICAL | Status: DC | PRN
  Administered 2022-05-09: 500 mL

## 2022-05-09 MED ORDER — PROPOFOL 10 MG/ML IV BOLUS
INTRAVENOUS | Status: DC | PRN
  Administered 2022-05-09: 200 mg via INTRAVENOUS

## 2022-05-09 MED ORDER — FENTANYL CITRATE (PF) 100 MCG/2ML IJ SOLN: 25.0000 ug | INTRAMUSCULAR | Status: DC | PRN

## 2022-05-09 MED ORDER — FENTANYL CITRATE (PF) 100 MCG/2ML IJ SOLN
INTRAMUSCULAR | Status: DC | PRN
  Administered 2022-05-09: 25 ug via INTRAVENOUS
  Administered 2022-05-09: 50 ug via INTRAVENOUS
  Administered 2022-05-09: 25 ug via INTRAVENOUS

## 2022-05-09 MED ORDER — OXYCODONE HCL 5 MG PO TABS: 5.0000 mg | ORAL_TABLET | Freq: Once | ORAL | Status: DC | PRN

## 2022-05-09 MED ORDER — DEXMEDETOMIDINE HCL IN NACL 200 MCG/50ML IV SOLN
INTRAVENOUS | Status: DC | PRN
  Administered 2022-05-09: 12 ug via INTRAVENOUS

## 2022-05-09 MED ORDER — OXYCODONE-ACETAMINOPHEN 5-325 MG PO TABS: 1.0000 | ORAL_TABLET | Freq: Four times a day (QID) | ORAL | Status: DC | PRN

## 2022-05-09 MED ORDER — DEXAMETHASONE SODIUM PHOSPHATE 10 MG/ML IJ SOLN
INTRAMUSCULAR | Status: AC
  Filled 2022-05-09: qty 1

## 2022-05-09 MED ORDER — ONDANSETRON HCL 4 MG/2ML IJ SOLN
INTRAMUSCULAR | Status: AC
  Filled 2022-05-09: qty 2

## 2022-05-09 MED ORDER — SODIUM CHLORIDE 0.9 % IV SOLN: INTRAVENOUS | Status: DC | PRN

## 2022-05-09 MED ORDER — BUPIVACAINE HCL (PF) 0.5 % IJ SOLN
INTRAMUSCULAR | Status: AC
  Filled 2022-05-09: qty 30

## 2022-05-09 MED ORDER — BUPIVACAINE HCL 0.5 % IJ SOLN
INTRAMUSCULAR | Status: DC | PRN
  Administered 2022-05-09: 20 mL

## 2022-05-09 MED ORDER — LIDOCAINE HCL (CARDIAC) PF 100 MG/5ML IV SOSY
PREFILLED_SYRINGE | INTRAVENOUS | Status: DC | PRN
  Administered 2022-05-09: 100 mg via INTRAVENOUS

## 2022-05-09 SURGICAL SUPPLY — 54 items
BLADE MED AGGRESSIVE (BLADE) ×1 IMPLANT
BLADE OSC/SAGITTAL MD 5.5X18 (BLADE) IMPLANT
BLADE SURG 15 STRL LF DISP TIS (BLADE) IMPLANT
BLADE SURG 15 STRL SS (BLADE)
BLADE SURG MINI STRL (BLADE) IMPLANT
BNDG ELASTIC 4X5.8 VLCR STR LF (GAUZE/BANDAGES/DRESSINGS) ×1 IMPLANT
BNDG ESMARK 4X12 TAN STRL LF (GAUZE/BANDAGES/DRESSINGS) ×1 IMPLANT
BNDG GAUZE DERMACEA FLUFF 4 (GAUZE/BANDAGES/DRESSINGS) ×1 IMPLANT
CNTNR SPEC 2.5X3XGRAD LEK (MISCELLANEOUS) ×1
CONT SPEC 4OZ STRL OR WHT (MISCELLANEOUS) ×1
CONTAINER SPEC 2.5X3XGRAD LEK (MISCELLANEOUS) ×1 IMPLANT
CUFF TOURN SGL QUICK 12 (TOURNIQUET CUFF) IMPLANT
CUFF TOURN SGL QUICK 18X4 (TOURNIQUET CUFF) IMPLANT
CUFF TOURN SGL QUICK 24 (TOURNIQUET CUFF) ×1
CUFF TRNQT CYL 24X4X40X1 (TOURNIQUET CUFF) IMPLANT
DRAPE FLUOR MINI C-ARM 54X84 (DRAPES) IMPLANT
DURAPREP 26ML APPLICATOR (WOUND CARE) ×1 IMPLANT
ELECT REM PT RETURN 9FT ADLT (ELECTROSURGICAL) ×1
ELECTRODE REM PT RTRN 9FT ADLT (ELECTROSURGICAL) ×1 IMPLANT
GAUZE PACKING IODOFORM 1/2INX (GAUZE/BANDAGES/DRESSINGS) IMPLANT
GAUZE SPONGE 4X4 12PLY STRL (GAUZE/BANDAGES/DRESSINGS) ×2 IMPLANT
GAUZE STRETCH 2X75IN STRL (MISCELLANEOUS) IMPLANT
GAUZE XEROFORM 1X8 LF (GAUZE/BANDAGES/DRESSINGS) ×1 IMPLANT
GLOVE BIO SURGEON STRL SZ7 (GLOVE) ×1 IMPLANT
GLOVE SURG UNDER LTX SZ7 (GLOVE) ×1 IMPLANT
GOWN STRL REUS W/ TWL LRG LVL3 (GOWN DISPOSABLE) ×2 IMPLANT
GOWN STRL REUS W/TWL LRG LVL3 (GOWN DISPOSABLE) ×2
HANDPIECE VERSAJET DEBRIDEMENT (MISCELLANEOUS) IMPLANT
IV NS IRRIG 3000ML ARTHROMATIC (IV SOLUTION) IMPLANT
KIT STIMULAN RAPID CURE 5CC (Orthopedic Implant) IMPLANT
KIT TURNOVER KIT A (KITS) ×1 IMPLANT
LABEL OR SOLS (LABEL) ×1 IMPLANT
Large OrthoWedge Postop Shoe LAWSON 110070 Same Day NonCharge IMPLANT
MANIFOLD NEPTUNE II (INSTRUMENTS) ×1 IMPLANT
NDL FILTER BLUNT 18X1 1/2 (NEEDLE) ×1 IMPLANT
NDL HYPO 25X1 1.5 SAFETY (NEEDLE) ×2 IMPLANT
NEEDLE FILTER BLUNT 18X1 1/2 (NEEDLE) ×1 IMPLANT
NEEDLE HYPO 25X1 1.5 SAFETY (NEEDLE) ×2 IMPLANT
NS IRRIG 500ML POUR BTL (IV SOLUTION) ×1 IMPLANT
PACK EXTREMITY ARMC (MISCELLANEOUS) ×1 IMPLANT
PAD ABD DERMACEA PRESS 5X9 (GAUZE/BANDAGES/DRESSINGS) IMPLANT
PULSAVAC PLUS IRRIG FAN TIP (DISPOSABLE) ×1
SOL PREP PVP 2OZ (MISCELLANEOUS) ×1
SOLUTION PREP PVP 2OZ (MISCELLANEOUS) ×1 IMPLANT
STOCKINETTE STRL 6IN 960660 (GAUZE/BANDAGES/DRESSINGS) ×1 IMPLANT
SUT ETHILON 3-0 FS-10 30 BLK (SUTURE) ×2
SUT VIC AB 3-0 SH 27 (SUTURE) ×1
SUT VIC AB 3-0 SH 27X BRD (SUTURE) ×1 IMPLANT
SUTURE EHLN 3-0 FS-10 30 BLK (SUTURE) ×2 IMPLANT
SWAB CULTURE AMIES ANAERIB BLU (MISCELLANEOUS) IMPLANT
SYR 10ML LL (SYRINGE) ×1 IMPLANT
TIP FAN IRRIG PULSAVAC PLUS (DISPOSABLE) IMPLANT
TRAP FLUID SMOKE EVACUATOR (MISCELLANEOUS) ×1 IMPLANT
WATER STERILE IRR 500ML POUR (IV SOLUTION) ×1 IMPLANT

## 2022-05-09 NOTE — Transfer of Care (Signed)
Immediate Anesthesia Transfer of Care Note  Patient: Cody Burke  Procedure(s) Performed: AMPUTATION FIRST RAY (Right)  Patient Location: PACU  Anesthesia Type:General  Level of Consciousness: awake and drowsy  Airway & Oxygen Therapy: Patient Spontanous Breathing and Patient connected to face mask oxygen  Post-op Assessment: Report given to RN and Post -op Vital signs reviewed and stable  Post vital signs: Reviewed and stable  Last Vitals:  Vitals Value Taken Time  BP 122/83 05/09/22 1319  Temp    Pulse 81 05/09/22 1322  Resp 16 05/09/22 1322  SpO2 95 % 05/09/22 1322  Vitals shown include unvalidated device data.  Last Pain:  Vitals:   05/08/22 2037  TempSrc:   PainSc: 0-No pain         Complications: No notable events documented.

## 2022-05-09 NOTE — H&P (Signed)
HISTORY AND PHYSICAL INTERVAL NOTE:  05/09/2022  11:42 AM  Cody Burke  has presented today for surgery, with the diagnosis of First Digit Ray Toe Amputation.  The various methods of treatment have been discussed with the patient.  No guarantees were given.  After consideration of risks, benefits and other options for treatment, the patient has consented to surgery.  I have reviewed the patients' chart and labs.    PROCEDURE: RIGHT FOOT INCISION AND DRAINAGE WITH REMOVAL OF ALL NONVIABLE AND NECROTIC TISSUES INCLUDING BONE PARTIAL 1ST RAY AMPUTATION ANTIBIOTIC BEAD APPLICATION   A history and physical examination was performed in the hospital.  The patient was reexamined.  There have been no changes to this history and physical examination.  Caroline More, DPM

## 2022-05-09 NOTE — Anesthesia Postprocedure Evaluation (Signed)
Anesthesia Post Note  Patient: Cody Burke  Procedure(s) Performed: AMPUTATION FIRST RAY (Right)  Patient location during evaluation: PACU Anesthesia Type: General Level of consciousness: awake and alert Pain management: pain level controlled Vital Signs Assessment: post-procedure vital signs reviewed and stable Respiratory status: spontaneous breathing, nonlabored ventilation, respiratory function stable and patient connected to nasal cannula oxygen Cardiovascular status: blood pressure returned to baseline and stable Postop Assessment: no apparent nausea or vomiting Anesthetic complications: no  No notable events documented.   Last Vitals:  Vitals:   05/09/22 1400 05/09/22 1423  BP: 121/85 125/78  Pulse: 74 72  Resp: 13 16  Temp:  36.6 C  SpO2: 97% 96%    Last Pain:  Vitals:   05/09/22 1345  TempSrc:   PainSc: 0-No pain                 Dimas Millin

## 2022-05-09 NOTE — Consult Note (Signed)
Pharmacy Antibiotic Note  Cody Burke is a 56 y.o. male admitted on 05/08/2022 with  diabetic foot ulcer with osteomyelitis . Podiatry following and planning for surgical debridement 12/15. Pharmacy has been consulted for vancomycin dosing. Patient is also ordered ceftriaxone.  Chart review shows wound culture from foot on 04/25/22 which grew Enterobacter cloacae and Klebsiella pneumoniae.  Plan:  Continue vancomycin 1.75 g IV q12h --Calculated AUC: 501, Cmin 13.5 --Daily Scr per protocol --Levels at steady state or as clinically indicated  Height: 6' (182.9 cm) Weight: (!) 145.2 kg (320 lb) IBW/kg (Calculated) : 77.6  Temp (24hrs), Avg:98.1 F (36.7 C), Min:97.7 F (36.5 C), Max:98.6 F (37 C)  Recent Labs  Lab 05/08/22 0954 05/08/22 1107 05/09/22 0438 05/09/22 1036  WBC 17.2*  --  16.1*  --   CREATININE 0.83  --  0.74  --   LATICACIDVEN 2.1* 1.8  --   --   VANCORANDOM  --   --   --  9     Estimated Creatinine Clearance: 148.9 mL/min (by C-G formula based on SCr of 0.74 mg/dL).    No Known Allergies  Antimicrobials this admission: Ceftriaxone 12/14 >>  Vancomycin 12/14 >>   Dose adjustments this admission: N/A  Microbiology results: 12/14 BCx: NGTD 12/14 WCx: GPC in pairs & chains on gram stain  Thank you for allowing pharmacy to be a part of this patient's care.  Benita Gutter 05/09/2022 11:17 AM

## 2022-05-09 NOTE — Anesthesia Preprocedure Evaluation (Signed)
Anesthesia Evaluation  Patient identified by MRN, date of birth, ID band Patient awake    Reviewed: Allergy & Precautions, NPO status , Patient's Chart, lab work & pertinent test results  History of Anesthesia Complications Negative for: history of anesthetic complications  Airway Mallampati: IV  TM Distance: >3 FB Neck ROM: full    Dental no notable dental hx. (+) Chipped, Dental Advidsory Given   Pulmonary neg pulmonary ROS, neg COPD, Patient abstained from smoking.Not current smoker, former smoker   Pulmonary exam normal breath sounds clear to auscultation       Cardiovascular Exercise Tolerance: Good METShypertension, Pt. on medications + CAD  (-) Past MI Normal cardiovascular exam(-) dysrhythmias  Rhythm:Regular Rate:Normal - Systolic murmurs    Neuro/Psych negative neurological ROS  negative psych ROS   GI/Hepatic negative GI ROS, Neg liver ROS,neg GERD  ,,  Endo/Other  negative endocrine ROSdiabetes, Poorly Controlled, Type 2  On ozempic, held for 8 days  Renal/GU negative Renal ROS  negative genitourinary   Musculoskeletal   Abdominal  (+) + obese  Peds  Hematology negative hematology ROS (+)   Anesthesia Other Findings Past Medical History: No date: Diabetes mellitus without complication (HCC) No date: Hyperlipidemia No date: Hypertension No date: Obesity No date: Peripheral vascular disease (HCC) No date: Sleep apnea     Comment:  severe - new DX - has not gotten CPAP yet  Past Surgical History: 03/07/2022: ARTHRODESIS METATARSALPHALANGEAL JOINT (MTPJ); Right     Comment:  Procedure: [id number] Shauna Hugh;  Surgeon:               Samara Deist, DPM;  Location: [facility];  Service:               Podiatry;  Laterality: Right; 09/21/2015: COLONOSCOPY WITH PROPOFOL; N/A     Comment:  Procedure: COLONOSCOPY WITH PROPOFOL;  Surgeon: Lucilla Lame, MD;  Location: [place];   Service:               Endoscopy;  Laterality: N/A;  Diabetic - oral meds sleep              apnea 03/07/2022: SESMOIDECTOMY; Right     Comment:  Procedure: SESMOIDECTOMY;  Surgeon: Samara Deist, DPM;              Location: [facility];  Service: Podiatry;  Laterality:               Right; No date: VASCULAR SURGERY; Bilateral     Comment:  legs 03/07/2022: WOUND DEBRIDEMENT; Right     Comment:  Procedure: DEBRIDEMENT OF RIGHT FOOT ULCER;  Surgeon:               Samara Deist, DPM;  Location: [facility];  Service:               Podiatry;  Laterality: Right;  BMI    Body Mass Index: 43.40 kg/m      Reproductive/Obstetrics negative OB ROS                             Anesthesia Physical Anesthesia Plan  ASA: 3  Anesthesia Plan: General   Post-op Pain Management: Ofirmev IV (intra-op)* and Toradol IV (intra-op)*   Induction: Intravenous  PONV Risk Score and Plan: 2 and TIVA, Midazolam, Propofol infusion and Ondansetron  Airway Management Planned: Natural  Airway and Nasal Cannula  Additional Equipment: None  Intra-op Plan:   Post-operative Plan: Extubation in OR  Informed Consent: I have reviewed the patients History and Physical, chart, labs and discussed the procedure including the risks, benefits and alternatives for the proposed anesthesia with the patient or authorized representative who has indicated his/her understanding and acceptance.     Dental Advisory Given  Plan Discussed with: Anesthesiologist, CRNA and Surgeon  Anesthesia Plan Comments: (Patient consented for risks of anesthesia including but not limited to:  - adverse reactions to medications - risk of airway placement if required - damage to eyes, teeth, lips or other oral mucosa - nerve damage due to positioning  - sore throat or hoarseness - Damage to heart, brain, nerves, lungs, other parts of body or loss of life  Patient voiced understanding.)        Anesthesia  Quick Evaluation

## 2022-05-09 NOTE — Plan of Care (Signed)

## 2022-05-09 NOTE — Anesthesia Procedure Notes (Signed)
Procedure Name: Intubation Date/Time: 05/09/2022 12:01 PM  Performed by: Doreen Salvage, CRNAPre-anesthesia Checklist: Patient identified, Patient being monitored, Timeout performed, Emergency Drugs available and Suction available Patient Re-evaluated:Patient Re-evaluated prior to induction Oxygen Delivery Method: Circle system utilized Preoxygenation: Pre-oxygenation with 100% oxygen Induction Type: IV induction Ventilation: Mask ventilation without difficulty Laryngoscope Size: Mac, 3 and McGraph Grade View: Grade I Tube type: Oral Tube size: 7.5 mm Number of attempts: 1 Airway Equipment and Method: Stylet Placement Confirmation: ETT inserted through vocal cords under direct vision, positive ETCO2 and breath sounds checked- equal and bilateral Secured at: 25 cm Tube secured with: Tape Dental Injury: Teeth and Oropharynx as per pre-operative assessment

## 2022-05-09 NOTE — Op Note (Addendum)
PODIATRY / FOOT AND ANKLE SURGERY OPERATIVE REPORT    SURGEON: Caroline More, DPM  PRE-OPERATIVE DIAGNOSIS:  1.  Right first metatarsal phalangeal joint septic joint infection with associated osteomyelitis, cellulitis, and diabetic foot ulcerations 2.  Diabetes type 2 polyneuropathy  POST-OPERATIVE DIAGNOSIS: Same  PROCEDURE(S): Right partial first ray amputation Rotational skin flap closure right foot Application of antibiotic beads  HEMOSTASIS: Right ankle tourniquet  ANESTHESIA: general  ESTIMATED BLOOD LOSS: 40 cc  FINDING(S): 1.  Septic joint right first metatarsal phalangeal joint with necrotic and nonviable tissue surrounding the joint.  First metatarsal appeared to be infected down close to the base where resection was performed.  PATHOLOGY/SPECIMEN(S): Right first ray with proximal margin marked in purple ink pathology specimen, first metatarsal base closing bone culture  INDICATIONS:   Cody Burke is a 56 y.o. male who presents with a nonhealing ulceration of the plantar aspect of the right first metatarsal phalangeal joint.  Patient was seen by Dr. Vickki Muff in clinic and was noted to have worsening signs of infection as well as potential septic joint.  X-ray imaging was taken as well as MRI which showed osteomyelitis present to the joint with substantial abscess and osteomyelitis presenting to the first metatarsal midshaft almost to the base.  Patient was admitted to the hospital and presents for surgical intervention today.  All treatment options were discussed with the patient and patient's family both conservative and surgical attempts at correction clean potential risks and complications.  At this time patient is elected for surgical intervention consisting of right partial first ray amputation with removal of all nonviable necrotic tissues, applications of antibiotic beads.  No guarantees given.  Discussed with patient he may have to have subsequent surgery in the future if  this does not heal.  Patient is at high risk for limb loss.  Patient understands..  DESCRIPTION: After obtaining full informed written consent, the patient was brought back to the operating room and placed supine upon the operating table.  The patient received IV antibiotics prior to induction.  After obtaining adequate anesthesia, 20 cc of half percent Marcaine plain was injected about the ankle and an ankle type block.  The patient was prepped and draped in the standard fashion.  An Esmarch bandage was used to exsanguinate the right lower extremity and pneumatic ankle tourniquet was inflated.  Attention was then directed to the right first ray.  There were 2 separate ulcerations 1 being over the dorsal aspect of the first metatarsal phalangeal joint and one being under the plantar aspect.  The dorsal wound measured approximately 2 cm x 1 cm and the plantar wound measured approximately 1 cm x 1 cm.  Both wounds probe directly to bone.  The incision was made in such a way to excise both the dorsal and plantar wounds and make a racquet type of incision around the big toe connecting the 2 ulcerative sites that were excised.  The incision was made straight to bone.  The ulcerative areas were excised.  And extensor tenotomy and capsulotomy was performed followed by release the collateral and suspensory ligaments as well as any connection of the plantar plate and flexor tendon.  The big toe was disarticulated and passed off the operative site.  There appeared to be notable purulence and nonviable tissue within the joint consistent with septic joint and osteomyelitis.  The abscess appeared to be tunneling around the flexor tendon to the plantar aspect of the foot and to the first webspace.  Circumferential dissection  was then continued around the first metatarsal head extending to the midshaft and base.  At this time sagittal bone saw was used to resect the first metatarsal at the base with the appropriate beveling  taking more dorsal medial and plantar lateral.  The first metatarsal was then dissected free and passed off the operative site and a proximal margin was marked in purple ink.  The pathologic specimen was passed off the operative site and labeled as right partial first ray with proximal margin marked in purple ink.  The bone at the base level appeared to be fairly healthy and viable and appeared to be hard.  No substantial evidence clinically of osteomyelitis to this area at the bony resected site.  Once again all nonviable necrotic tissues were resected and passed off the operative site.  There appeared to be some unhealthy tissue present directly underneath the skin near the dorsal aspect of the second metatarsal phalangeal joint midshaft second metatarsal.  The surgical site was flushed with copious amounts normal sterile saline with pulse lavage, 3 L.  Any further nonviable necrotic tissue was debrided further with curette as well as a 15 blade and scissors.  Tissues appeared to be fairly healthy and viable.  Any bleeding vessels were cauterized with electrocauterization.  Antibiotic beads with vancomycin were then placed into the procedure site and around the first metatarsal base, first interspace, and around the area of the amputation in general.  Deep closure was then obtained with 3-0 Vicryl.  The wound was then packed with iodoform packing gauze, half inch.  The skin was then rotated and advanced 3 cm to cover the void and reapproximated well coapted with 3-0 nylon in a combination of simple and horizontal mattress type stitching.  The pneumatic ankle tourniquet was deflated and a prompt hyperemic response was noted to all digits of the right foot including the skin flap.  A postoperative dressing was applied consisting of Xeroform to the incisional area followed by 4 x 4 gauze, ABD, Kerlix, Ace wrap with compression.  The patient tolerated the procedure and anesthesia well was transferred to recovery  with vital signs stable vascular status appearing to be intact to all toes remaining of the right foot.  Patient will be placed back into the inpatient room with the appropriate orders and instructions as well as medications.  Patient is to remain nonweightbearing and only use heel contact for transfers.  Would prefer he stays off of it almost entirely during the healing process.  Will discuss with family.  Will plan on seeing patient again tomorrow to remove packing gauze.  Believe mostly that all infection was removed with amputation.  Would recommend patient stay on IV antibiotics through the weekend and then reassess Monday with cultures.  Potentially could discharge on oral antibiotics based on cultures and presentation.  If bone culture or biopsy does come back positive then patient will need IV antibiotics for a prolonged period of time and will need infectious disease consultation.  COMPLICATIONS: None  CONDITION: Good, stable  Caroline More, DPM

## 2022-05-09 NOTE — Progress Notes (Signed)
[place] [facility] [place] NAME: Cody Burke    MR#:  [id number]  DATE OF BIRTH:  05-11-1966  SUBJECTIVE:  patient was admitted from podiatry office with increasing infection and swelling of the right foot. NPO for surgery. Wife at bedside.   VITALS:  Blood pressure 125/78, pulse 72, temperature 97.8 F (36.6 C), resp. rate 16, height 6' (1.829 m), weight (!) 145.2 kg, SpO2 96 %.  PHYSICAL EXAMINATION:   GENERAL:  56 y.o.-year-old patient lying in the bed with no acute distress. Obese LUNGS: Normal breath sounds bilaterally, no wheezing CARDIOVASCULAR: S1, S2 normal. No murmur   ABDOMEN: Soft, nontender, nondistended. Bowel sounds present.  EXTREMITIES:     NEUROLOGIC: nonfocal  patient is alert and awake SKIN: as above  LABORATORY PANEL:  CBC Recent Labs  Lab 05/09/22 0438  WBC 16.1*  HGB 13.8  HCT 41.9  PLT 281    Chemistries  Recent Labs  Lab 05/08/22 0954 05/09/22 0438  NA 135 135  K 4.2 3.9  CL 107 108  CO2 22 20*  GLUCOSE 221* 157*  BUN 19 14  CREATININE 0.83 0.74  CALCIUM 9.3 8.1*  AST 24  --   ALT 28  --   ALKPHOS 96  --   BILITOT 1.0  --    Cardiac Enzymes No results for input(s): "TROPONINI" in the last 168 hours. RADIOLOGY:  MR FOOT RIGHT W WO CONTRAST  Result Date: 05/08/2022 CLINICAL DATA:  Plantar first MTP joint ulcer. Status post debridement in October with fibular sesamoidectomy and first IP joint arthrodesis. EXAM: MRI OF THE RIGHT FOREFOOT WITHOUT AND WITH CONTRAST TECHNIQUE: Multiplanar, multisequence MR imaging of the right forefoot was performed before and after the administration of intravenous contrast. CONTRAST:  62m GADAVIST GADOBUTROL 1 MMOL/ML IV SOLN COMPARISON:  Intraoperative x-rays dated March 07, 2022. FINDINGS: Bones/Joint/Cartilage Fairly confluent marrow edema and enhancement with corresponding decreased T1 marrow signal involving the majority of the first metatarsal, sparing  the base. Patchy marrow edema and enhancement at the base of the first proximal phalanx with associated decreased T1 marrow signal. Findings are consistent with osteomyelitis. Large first MTP joint effusion. No acute fracture or dislocation. Prior first IP joint arthrodesis. No joint effusion. Ligaments Second through fifth toe collateral ligaments are intact. Muscles and Tendons Expanded and ill-defined flexor hallucis longus tendon at the level of the first metatarsal head with associated tenosynovial enhancement. Increased T2 signal within and atrophy of the intrinsic muscles of the forefoot, nonspecific, but likely related to diabetic muscle changes. Soft tissue Plantar forefoot soft tissue ulceration with sinus tract extending to the first MTP joint (series 12, image 22). Large multiloculated rim enhancing fluid collection dorsal to the first MTP joint, measuring 4.0 x 2.1 x 2.2 cm (series 14, image 10). This extends to the dorsal skin surface (series 12, image 26). Prominent dorsal and medial forefoot soft tissue swelling extending into the first and second toes. No soft tissue mass. IMPRESSION: 1. Plantar forefoot soft tissue ulceration with sinus tract extending to the first MTP joint. 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 2. Large 4.0 cm abscess dorsal to the first MTP joint extending to the skin surface. 3. Expanded and ill-defined flexor hallucis longus tendon at the level of the first metatarsal head with associated tenosynovial enhancement, concerning for infectious tendinopathy/tenosynovitis. 4. Severe forefoot cellulitis extending into the first and second toes. Electronically Signed  By: Cody Burke M.D.   On: 05/08/2022 16:03    Assessment and Plan Cody Burke is a 56 y.o. male with medical history significant of hypertension, hyperlipidemia, diabetes mellitus, morbid obesity with BMI of 43.4, PVD, who presents with right foot  ulcer with infection.    X-rays were obtained in office which revealed concerns for osteomyelitis  MRI right foot 1. Plantar forefoot soft tissue ulceration with sinus tract extending to the first MTP joint. 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 2. Large 4.0 cm abscess dorsal to the first MTP joint extending to the skin surface. 3. Expanded and ill-defined flexor hallucis longus tendon at the level of the first metatarsal head with associated tenosynovial enhancement, concerning for infectious tendinopathy/tenosynovitis. 4. Severe forefoot cellulitis extending into the first and second toes.  Severe sepsis due to diabetic right foot ulcer with osteomyelitis Cody Burke): pt meets criteria for severe sepsis with WBC 17.2, heart rate of 114.  Lactic acid is elevated 2.1 which has normalized to 1.8 after giving IVF in ED. --Dr. Luana Burke of podiatry  consulted --12/15--pt is s/pRight first metatarsal phalangeal joint septic joint infection with associated osteomyelitis, cellulitis, and diabetic foot ulcerations - Empiric antimicrobial treatment with vancomycin and cefepime  - Blood cultures x 2--negative    Hypertension: -IV hydralazine as needed -Hold Lotensin since patient may die risk of developing hypotension due to severe sepsis   Hyperlipidemia -Lipitor   Diabetes mellitus without complication (Cody Burke): Recent A1c 8.9, poorly controlled.  Patient taking metformin and Farxiga at home -SSI   PVD (peripheral vascular disease) (Cody Burke) -Aspirin and Lipitor   Obesity, Class III, BMI 40-49.9 (morbid obesity) (Cody Burke): Patient is taking Farxigar at home.  BMI 43.40, body weight 145.2 kg -Exercise and healthy diet -Encouraged to lose weight    Procedures:s/pRight first metatarsal phalangeal joint septic joint infection with associated osteomyelitis Family communication :wife Consults :Podiatry CODE STATUS: full DVT Prophylaxis  :heparin Level of care: Med-Surg Status is: Inpatient Remains inpatient appropriate because: diabetic foot infection    TOTAL TIME TAKING CARE OF THIS PATIENT: 35 minutes.  >50% time spent on counselling and coordination of care  Note: This dictation was prepared with Dragon dictation along with smaller phrase technology. Any transcriptional errors that result from this process are unintentional.  Fritzi Mandes M.D    [facility]   CC: Primary care physician; Valerie Roys, DO

## 2022-05-09 NOTE — Inpatient Diabetes Management (Signed)
Inpatient Diabetes Program Recommendations  AACE/ADA: New Consensus Statement on Inpatient Glycemic Control   Target Ranges:  Prepandial:   less than 140 mg/dL      Peak postprandial:   less than 180 mg/dL (1-2 hours)      Critically ill patients:  140 - 180 mg/dL    Latest Reference Range & Units 05/09/22 08:06  Glucose-Capillary 70 - 99 mg/dL 151 (H)    Latest Reference Range & Units 05/08/22 13:32 05/08/22 17:33 05/08/22 21:19  Glucose-Capillary 70 - 99 mg/dL 181 (H) 208 (H) 203 (H)    Review of Glycemic Control  Diabetes history: DM2 Outpatient Diabetes medications: Farxiga 10 mg daily, Metformin XR 1000 mg BID Current orders for Inpatient glycemic control: Novolog 0-9 units TID with meals, Novolog 0-5 units QHS  Inpatient Diabetes Program Recommendations:    Insulin: Please consider ordering Novolog 3 units TID with meals for meal coverage if patient eats at least 50% of meals.  Thanks, Barnie Alderman, RN, MSN, [place] Diabetes Coordinator Inpatient Diabetes Program [phone] (Team Pager from 8am to [place])

## 2022-05-10 DIAGNOSIS — E1169 Type 2 diabetes mellitus with other specified complication: Secondary | ICD-10-CM | POA: Diagnosis not present

## 2022-05-10 DIAGNOSIS — E11621 Type 2 diabetes mellitus with foot ulcer: Secondary | ICD-10-CM | POA: Diagnosis not present

## 2022-05-10 DIAGNOSIS — L97509 Non-pressure chronic ulcer of other part of unspecified foot with unspecified severity: Secondary | ICD-10-CM | POA: Diagnosis not present

## 2022-05-10 DIAGNOSIS — M869 Osteomyelitis, unspecified: Secondary | ICD-10-CM | POA: Diagnosis not present

## 2022-05-10 LAB — CREATININE, SERUM
Creatinine, Ser: 0.68 mg/dL (ref 0.61–1.24)
GFR, Estimated: >60 mL/min (ref >60)

## 2022-05-10 LAB — BODY FLUID CULTURE W GRAM STAIN

## 2022-05-10 LAB — GLUCOSE, CAPILLARY
Glucose-Capillary: 197 mg/dL — ABNORMAL HIGH (ref 70–99)
Glucose-Capillary: 221 mg/dL — ABNORMAL HIGH (ref 70–99)
Glucose-Capillary: 157 mg/dL — ABNORMAL HIGH (ref 70–99)
Glucose-Capillary: 147 mg/dL — ABNORMAL HIGH (ref 70–99)

## 2022-05-10 MED ORDER — SODIUM CHLORIDE 0.9 % IV SOLN
2.0000 g | INTRAVENOUS | Status: DC
  Administered 2022-05-10 – 2022-05-12 (×3): 2 g via INTRAVENOUS
  Filled 2022-05-10 (×3): qty 2

## 2022-05-10 MED ORDER — METFORMIN HCL ER 500 MG PO TB24
1000.0000 mg | ORAL_TABLET | Freq: Two times a day (BID) | ORAL | Status: DC
  Administered 2022-05-10 – 2022-05-12 (×4): 1000 mg via ORAL
  Filled 2022-05-10 (×5): qty 2

## 2022-05-10 MED ORDER — DAPAGLIFLOZIN PROPANEDIOL 10 MG PO TABS
10.0000 mg | ORAL_TABLET | Freq: Every day | ORAL | Status: DC
  Administered 2022-05-10 – 2022-05-12 (×3): 10 mg via ORAL
  Filled 2022-05-10 (×3): qty 1

## 2022-05-10 MED ORDER — BENAZEPRIL HCL 20 MG PO TABS
40.0000 mg | ORAL_TABLET | Freq: Every day | ORAL | Status: DC
  Administered 2022-05-10 – 2022-05-12 (×3): 40 mg via ORAL
  Filled 2022-05-10 (×3): qty 2

## 2022-05-10 NOTE — Plan of Care (Signed)

## 2022-05-10 NOTE — Progress Notes (Signed)
PODIATRY / FOOT AND ANKLE SURGERY CONSULTATION NOTE  Requesting Physician: Dr. Blaine Hamper  Reason for consult: R foot infection  Chief Complaint: R foot wound, infection, cellulitis   HPI: Cody Burke is a 56 y.o. male who presents s/p 1 day right partial 1st ray amputation with antibiotic bead application and rotational skin flap closure with packing.  Patient had some mild strikethrough in the evening last night and has additional bandaging applied.  Patient was instructed on continued elevation and NWB with heel contact only for transfers.  Patient notes no pain at this time.  PMHx:  Past Medical History:  Diagnosis Date   Diabetes mellitus without complication ([place])    Hyperlipidemia    Hypertension    Obesity    Peripheral vascular disease ([place])    Sleep apnea    severe - new DX - has not gotten CPAP yet    Surgical Hx:  Past Surgical History:  Procedure Laterality Date   ARTHRODESIS METATARSALPHALANGEAL JOINT (MTPJ) Right 03/07/2022   Procedure: 96[address] Shauna Hugh;  Surgeon: Samara Deist, DPM;  Location: [facility];  Service: Podiatry;  Laterality: Right;   COLONOSCOPY WITH PROPOFOL N/A 09/21/2015   Procedure: COLONOSCOPY WITH PROPOFOL;  Surgeon: Lucilla Lame, MD;  Location: [place];  Service: Endoscopy;  Laterality: N/A;  Diabetic - oral meds sleep apnea   SESMOIDECTOMY Right 03/07/2022   Procedure: SESMOIDECTOMY;  Surgeon: Samara Deist, DPM;  Location: [facility];  Service: Podiatry;  Laterality: Right;   VASCULAR SURGERY Bilateral    legs   WOUND DEBRIDEMENT Right 03/07/2022   Procedure: DEBRIDEMENT OF RIGHT FOOT ULCER;  Surgeon: Samara Deist, DPM;  Location: [facility];  Service: Podiatry;  Laterality: Right;    FHx:  Family History  Problem Relation Age of Onset   Cancer Mother    Hypertension Father     Social History:  reports that he quit smoking about 18 years ago. His smoking use included cigarettes. He has never used smokeless tobacco. He  reports current alcohol use. He reports that he does not use drugs.  Allergies: No Known Allergies  Medications Prior to Admission  Medication Sig Dispense Refill   aspirin EC 81 MG tablet Take 81 mg by mouth daily.     atorvastatin (LIPITOR) 20 MG tablet Take 1 tablet (20 mg total) by mouth daily. 90 tablet 1   benazepril (LOTENSIN) 40 MG tablet Take 1 tablet (40 mg total) by mouth daily. 90 tablet 1   dapagliflozin propanediol (FARXIGA) 10 MG TABS tablet Take 10 mg by mouth daily. 30 tablet 3   metFORMIN (GLUCOPHAGE-XR) 500 MG 24 hr tablet Take 2 tablets (1,000 mg total) by mouth 2 (two) times daily. 360 tablet 1   Multiple Vitamins-Minerals (MULTIVITAMIN GUMMIES ADULT PO) Take by mouth daily.     Omega-3 Fatty Acids (FISH OIL) 1000 MG CAPS Take 1,000 mg by mouth daily.     fluticasone (FLONASE) 50 MCG/ACT nasal spray Place into both nostrils. (Patient not taking: Reported on 05/08/2022)     glucose blood test strip Please dispense insurance preference. Use to check BS BID 200 each 4   Insulin Pen Needle (PEN NEEDLES) 31G X 8 MM MISC 1 each by Does not apply route once a week. 52 each 1     Physical Exam: General: Alert and oriented.  No apparent distress.  Vascular: DP/PT pulses +1 bilateral, capillary fill time appears to be intact to digits bilaterally.    Neuro: Light touch sensation absent to bilateral  lower extremities.  Derm: R partial 1st ray amp site appears to have mild serous drainage today out of the packing areas once removed, incision line appears well coapted with sutures intact, CFT intact to flaps, dramatically reduced erythema and edema present.  No purulence or fluctuance present today.  Appears to be very stable.    MSK: R partial 1st ray amputation  Results for orders placed or performed during the hospital encounter of 05/08/22 (from the past 48 hour(s))  Lactic acid, plasma     Status: Abnormal   Collection Time: 05/08/22  9:54 AM  Result Value Ref Range    Lactic Acid, Venous 2.1 (HH) 0.5 - 1.9 mmol/L    Comment: CRITICAL RESULT CALLED TO, READ BACK BY AND VERIFIED WITH LINDA [facility] AT 1040 05/08/22 DAS Performed at [facility], [address] [place], [place] [address]   Comprehensive metabolic panel     Status: Abnormal   Collection Time: 05/08/22  9:54 AM  Result Value Ref Range   Sodium 135 135 - 145 mmol/L   Potassium 4.2 3.5 - 5.1 mmol/L   Chloride 107 98 - 111 mmol/L   CO2 22 22 - 32 mmol/L   Glucose, Bld 221 (H) 70 - 99 mg/dL    Comment: Glucose reference range applies only to samples taken after fasting for at least 8 hours.   BUN 19 6 - 20 mg/dL   Creatinine, Ser 0.83 0.61 - 1.24 mg/dL   Calcium 9.3 8.9 - 10.3 mg/dL   Total Protein 8.1 6.5 - 8.1 g/dL   Albumin 3.2 (L) 3.5 - 5.0 g/dL   AST 24 15 - 41 U/L   ALT 28 0 - 44 U/L   Alkaline Phosphatase 96 38 - 126 U/L   Total Bilirubin 1.0 0.3 - 1.2 mg/dL   GFR, Estimated >60 >60 mL/min    Comment: (NOTE) Calculated using the CKD-EPI Creatinine Equation (2021)    Anion gap 6 5 - 15    Comment: Performed at [facility], [address] [place], [place] [address]  CBC with Differential     Status: Abnormal   Collection Time: 05/08/22  9:54 AM  Result Value Ref Range   WBC 17.2 (H) 4.0 - 10.5 K/uL   RBC 5.50 4.22 - 5.81 MIL/uL   Hemoglobin 16.1 13.0 - 17.0 g/dL   HCT 49.1 39.0 - 52.0 %   MCV 89.3 80.0 - 100.0 fL   MCH 29.3 26.0 - 34.0 pg   MCHC 32.8 30.0 - 36.0 g/dL   RDW 14.0 11.5 - 15.5 %   Platelets 311 150 - 400 K/uL   nRBC 0.1 0.0 - 0.2 %   Neutrophils Relative % 60 %   Neutro Abs 10.4 (H) 1.7 - 7.7 K/uL   Lymphocytes Relative 27 %   Lymphs Abs 4.6 (H) 0.7 - 4.0 K/uL   Monocytes Relative 8 %   Monocytes Absolute 1.4 (H) 0.1 - 1.0 K/uL   Eosinophils Relative 2 %   Eosinophils Absolute 0.3 0.0 - 0.5 K/uL   Basophils Relative 1 %   Basophils Absolute 0.2 (H) 0.0 - 0.1 K/uL   RBC Morphology MORPHOLOGY UNREMARKABLE    Smear Review Normal  platelet morphology    Immature Granulocytes 2 %   Abs Immature Granulocytes 0.28 (H) 0.00 - 0.07 K/uL   Reactive, Benign Lymphocytes PRESENT    Smudge Cells PRESENT     Comment: Performed at [facility], [address] [place], [place]  [address]  Culture, blood (routine x 2)     Status: None (Preliminary result)   Collection Time: 05/08/22  9:54 AM   Specimen: BLOOD  Result Value Ref Range   Specimen Description BLOOD RIGHT ANTECUBITAL    Special Requests      BOTTLES DRAWN AEROBIC AND ANAEROBIC Blood Culture adequate volume   Culture      NO GROWTH < 24 HOURS Performed at [facility], [address], [place], [place] [address]    Report Status PENDING   Procalcitonin     Status: None   Collection Time: 05/08/22  9:54 AM  Result Value Ref Range   Procalcitonin 0.10 ng/mL    Comment:        Interpretation: PCT (Procalcitonin) <= 0.5 ng/mL: Systemic infection (sepsis) is not likely. Local bacterial infection is possible. (NOTE)       Sepsis PCT Algorithm           Lower Respiratory Tract                                      Infection PCT Algorithm    ----------------------------     ----------------------------         PCT < 0.25 ng/mL                PCT < 0.10 ng/mL          Strongly encourage             Strongly discourage   discontinuation of antibiotics    initiation of antibiotics    ----------------------------     -----------------------------       PCT 0.25 - 0.50 ng/mL            PCT 0.10 - 0.25 ng/mL               OR       >80% decrease in PCT            Discourage initiation of                                            antibiotics      Encourage discontinuation           of antibiotics    ----------------------------     -----------------------------         PCT >= 0.50 ng/mL              PCT 0.26 - 0.50 ng/mL               AND        <80% decrease in PCT             Encourage initiation of                                              antibiotics       Encourage continuation           of antibiotics    ----------------------------     -----------------------------        PCT >= 0.50 ng/mL  PCT > 0.50 ng/mL               AND         increase in PCT                  Strongly encourage                                      initiation of antibiotics    Strongly encourage escalation           of antibiotics                                     -----------------------------                                           PCT <= 0.25 ng/mL                                                 OR                                        > 80% decrease in PCT                                      Discontinue / Do not initiate                                             antibiotics  Performed at [facility], [address] [place], [place] [address]   Culture, blood (routine x 2)     Status: None (Preliminary result)   Collection Time: 05/08/22 11:00 AM   Specimen: BLOOD  Result Value Ref Range   Specimen Description BLOOD LEFT ANTECUBITAL    Special Requests      BOTTLES DRAWN AEROBIC AND ANAEROBIC Blood Culture results may not be optimal due to an inadequate volume of blood received in culture bottles   Culture      NO GROWTH < 24 HOURS Performed at [facility], [address], [place], [place] [address]    Report Status PENDING   Lactic acid, plasma     Status: None   Collection Time: 05/08/22 11:07 AM  Result Value Ref Range   Lactic Acid, Venous 1.8 0.5 - 1.9 mmol/L    Comment: Performed at [facility], [address] [place], [place] [address]  Aerobic/Anaerobic Culture w Gram Stain (surgical/deep wound)     Status: None (Preliminary result)   Collection Time: 05/08/22  1:06 PM   Specimen: Wound  Result Value Ref Range   Specimen Description      WOUND Performed at [facility], [address], [place], [place] [address]    Special Requests      NONE  Performed  at [facility], [place], [place] [address]    Gram Stain      FEW WBC PRESENT, PREDOMINANTLY PMN FEW GRAM POSITIVE COCCI IN PAIRS RARE GRAM POSITIVE COCCI IN CHAINS    Culture      ABUNDANT STREPTOCOCCUS AGALACTIAE RARE GRAM NEGATIVE RODS IDENTIFICATION AND SUSCEPTIBILITIES TO FOLLOW TESTING AGAINST S. AGALACTIAE NOT ROUTINELY PERFORMED DUE TO PREDICTABILITY OF AMP/PEN/VAN SUSCEPTIBILITY. Performed at [facility], [place] [address] [place], [place] [address]    Report Status PENDING   Glucose, capillary     Status: Abnormal   Collection Time: 05/08/22  1:32 PM  Result Value Ref Range   Glucose-Capillary 181 (H) 70 - 99 mg/dL    Comment: Glucose reference range applies only to samples taken after fasting for at least 8 hours.  Surgical PCR screen     Status: None   Collection Time: 05/08/22  1:34 PM   Specimen: Nasal Mucosa; Nasal Swab  Result Value Ref Range   MRSA, PCR NEGATIVE NEGATIVE   Staphylococcus aureus NEGATIVE NEGATIVE    Comment: (NOTE) The Xpert SA Assay (FDA approved for NASAL specimens in patients 75 years of age and older), is one component of a comprehensive surveillance program. It is not intended to diagnose infection nor to guide or monitor treatment. Performed at [facility] [address] [place], [place] [address]   Sedimentation rate     Status: Abnormal   Collection Time: 05/08/22  2:24 PM  Result Value Ref Range   Sed Rate 45 (H) 0 - 20 mm/hr    Comment: Performed at [facility], [address] [place], [place] [address]  C-reactive protein     Status: Abnormal   Collection Time: 05/08/22  2:24 PM  Result Value Ref Range   CRP 12.4 (H) <1.0 mg/dL    Comment: Performed at [facility], [place] [address], [place], [place] [address]  Protime-INR     Status: Abnormal   Collection Time: 05/08/22  2:24 PM  Result Value Ref Range   Prothrombin Time 15.5 (H) 11.4 - 15.2 seconds   INR 1.2 0.8 -  1.2    Comment: (NOTE) INR goal varies based on device and disease states. Performed at [facility], [address] [place], [place] [address]   APTT     Status: None   Collection Time: 05/08/22  2:24 PM  Result Value Ref Range   aPTT 30 24 - 36 seconds    Comment: Performed at [facility], [address] [place], [place] [address]  Type and screen [place]     Status: None   Collection Time: 05/08/22  2:24 PM  Result Value Ref Range   ABO/RH(D) O POS    Antibody Screen NEG    Sample Expiration      05/11/2022,2359 Performed at [facility], [address] [place], [place] [address]   HIV Antibody (routine testing w rflx)     Status: None   Collection Time: 05/08/22  2:24 PM  Result Value Ref Range   HIV Screen 4th Generation wRfx Non Reactive Non Reactive    Comment: Performed at [facility], [place] [address] [place], [place] [address]  Glucose, capillary     Status: Abnormal   Collection Time: 05/08/22  5:33 PM  Result Value Ref Range   Glucose-Capillary 208 (H) 70 - 99 mg/dL    Comment: Glucose reference  range applies only to samples taken after fasting for at least 8 hours.  Urinalysis, Routine w reflex microscopic Urine, Clean Catch     Status: Abnormal   Collection Time: 05/08/22  5:55 PM  Result Value Ref Range   Color, Urine STRAW (A) YELLOW   APPearance CLEAR (A) CLEAR   Specific Gravity, Urine 1.027 1.005 - 1.030   pH 5.0 5.0 - 8.0   Glucose, UA >=500 (A) NEGATIVE mg/dL   Hgb urine dipstick NEGATIVE NEGATIVE   Bilirubin Urine NEGATIVE NEGATIVE   Ketones, ur 5 (A) NEGATIVE mg/dL   Protein, ur NEGATIVE NEGATIVE mg/dL   Nitrite NEGATIVE NEGATIVE   Leukocytes,Ua NEGATIVE NEGATIVE   RBC / HPF 0-5 0 - 5 RBC/hpf   WBC, UA 0-5 0 - 5 WBC/hpf   Bacteria, UA NONE SEEN NONE SEEN   Squamous Epithelial / LPF 0-5 0 - 5   Mucus PRESENT     Comment: Performed at [facility], [address]  [place], [place] [address]  Glucose, capillary     Status: Abnormal   Collection Time: 05/08/22  9:19 PM  Result Value Ref Range   Glucose-Capillary 203 (H) 70 - 99 mg/dL    Comment: Glucose reference range applies only to samples taken after fasting for at least 8 hours.  Basic metabolic panel     Status: Abnormal   Collection Time: 05/09/22  4:38 AM  Result Value Ref Range   Sodium 135 135 - 145 mmol/L   Potassium 3.9 3.5 - 5.1 mmol/L   Chloride 108 98 - 111 mmol/L   CO2 20 (L) 22 - 32 mmol/L   Glucose, Bld 157 (H) 70 - 99 mg/dL    Comment: Glucose reference range applies only to samples taken after fasting for at least 8 hours.   BUN 14 6 - 20 mg/dL   Creatinine, Ser 0.74 0.61 - 1.24 mg/dL   Calcium 8.1 (L) 8.9 - 10.3 mg/dL   GFR, Estimated >60 >60 mL/min    Comment: (NOTE) Calculated using the CKD-EPI Creatinine Equation (2021)    Anion gap 7 5 - 15    Comment: Performed at [facility], [address] [place], [place] [address]  CBC     Status: Abnormal   Collection Time: 05/09/22  4:38 AM  Result Value Ref Range   WBC 16.1 (H) 4.0 - 10.5 K/uL   RBC 4.66 4.22 - 5.81 MIL/uL   Hemoglobin 13.8 13.0 - 17.0 g/dL   HCT 41.9 39.0 - 52.0 %   MCV 89.9 80.0 - 100.0 fL   MCH 29.6 26.0 - 34.0 pg   MCHC 32.9 30.0 - 36.0 g/dL   RDW 14.3 11.5 - 15.5 %   Platelets 281 150 - 400 K/uL   nRBC 0.0 0.0 - 0.2 %    Comment: Performed at [facility], [address] [place], [place] [address]  Glucose, capillary     Status: Abnormal   Collection Time: 05/09/22  8:06 AM  Result Value Ref Range   Glucose-Capillary 151 (H) 70 - 99 mg/dL    Comment: Glucose reference range applies only to samples taken after fasting for at least 8 hours.  Vancomycin, random     Status: None   Collection Time: 05/09/22 10:36 AM  Result Value Ref Range   Vancomycin Rm 9 ug/mL    Comment:        Random Vancomycin therapeutic range is dependent on dosage and time of specimen collection. A  peak  range is 20-40 ug/mL A trough range is 5-15 ug/mL        Performed at [facility], [address] [place], [place] [address]   Glucose, capillary     Status: Abnormal   Collection Time: 05/09/22 11:23 AM  Result Value Ref Range   Glucose-Capillary 159 (H) 70 - 99 mg/dL    Comment: Glucose reference range applies only to samples taken after fasting for at least 8 hours.   Comment 1 Notify RN    Comment 2 Document in Chart   Aerobic/Anaerobic Culture w Gram Stain (surgical/deep wound)     Status: None (Preliminary result)   Collection Time: 05/09/22 12:36 PM   Specimen: PATH Other; Tissue  Result Value Ref Range   Specimen Description      BONE Performed at [facility], [address] [place], [place] [address]    Special Requests      RIGHT FIRST RAY BONE Performed at [facility], [address] [place], [place] [address]    Gram Stain NO WBC SEEN NO ORGANISMS SEEN     Culture      NO GROWTH < 24 HOURS Performed at [facility], [place] [address] [place], [place] [address]    Report Status PENDING   Glucose, capillary     Status: Abnormal   Collection Time: 05/09/22  1:22 PM  Result Value Ref Range   Glucose-Capillary 163 (H) 70 - 99 mg/dL    Comment: Glucose reference range applies only to samples taken after fasting for at least 8 hours.  Glucose, capillary     Status: Abnormal   Collection Time: 05/09/22  5:19 PM  Result Value Ref Range   Glucose-Capillary 316 (H) 70 - 99 mg/dL    Comment: Glucose reference range applies only to samples taken after fasting for at least 8 hours.  Glucose, capillary     Status: Abnormal   Collection Time: 05/09/22  9:27 PM  Result Value Ref Range   Glucose-Capillary 291 (H) 70 - 99 mg/dL    Comment: Glucose reference range applies only to samples taken after fasting for at least 8 hours.   Comment 1 Notify RN   Creatinine, serum     Status: None   Collection Time: 05/10/22  7:23 AM  Result  Value Ref Range   Creatinine, Ser 0.68 0.61 - 1.24 mg/dL   GFR, Estimated >60 >60 mL/min    Comment: (NOTE) Calculated using the CKD-EPI Creatinine Equation (2021) Performed at [facility], [address] [place], [place] [address]   Glucose, capillary     Status: Abnormal   Collection Time: 05/10/22  7:32 AM  Result Value Ref Range   Glucose-Capillary 197 (H) 70 - 99 mg/dL    Comment: Glucose reference range applies only to samples taken after fasting for at least 8 hours.   DG Foot Complete Right  Result Date: 05/09/2022 CLINICAL DATA:  Right foot surgery EXAM: RIGHT FOOT COMPLETE - 3+ VIEW COMPARISON:  MRI 05/08/2022 FINDINGS: Interval first ray resection to the level of the first metatarsal base. Antibiotic beads are seen within the resection bed. The remaining structures of the forefoot appear intact. No fracture or dislocation. Bidirectional calcaneal enthesophytes. Expected postoperative changes within the soft tissues of the medial forefoot. IMPRESSION: Interval first ray resection to the level of the first metatarsal base. No adverse postoperative findings. Electronically Signed   By: Davina Poke D.O.   On: 05/09/2022 15:27  MR FOOT RIGHT W WO CONTRAST  Result Date: 05/08/2022 CLINICAL DATA:  Plantar first MTP joint ulcer. Status post debridement in October with fibular sesamoidectomy and first IP joint arthrodesis. EXAM: MRI OF THE RIGHT FOREFOOT WITHOUT AND WITH CONTRAST TECHNIQUE: Multiplanar, multisequence MR imaging of the right forefoot was performed before and after the administration of intravenous contrast. CONTRAST:  62m GADAVIST GADOBUTROL 1 MMOL/ML IV SOLN COMPARISON:  Intraoperative x-rays dated March 07, 2022. FINDINGS: Bones/Joint/Cartilage Fairly confluent marrow edema and enhancement with corresponding decreased T1 marrow signal involving the majority of the first metatarsal, sparing the base. Patchy marrow edema and enhancement at the base of the  first proximal phalanx with associated decreased T1 marrow signal. Findings are consistent with osteomyelitis. Large first MTP joint effusion. No acute fracture or dislocation. Prior first IP joint arthrodesis. No joint effusion. Ligaments Second through fifth toe collateral ligaments are intact. Muscles and Tendons Expanded and ill-defined flexor hallucis longus tendon at the level of the first metatarsal head with associated tenosynovial enhancement. Increased T2 signal within and atrophy of the intrinsic muscles of the forefoot, nonspecific, but likely related to diabetic muscle changes. Soft tissue Plantar forefoot soft tissue ulceration with sinus tract extending to the first MTP joint (series 12, image 22). Large multiloculated rim enhancing fluid collection dorsal to the first MTP joint, measuring 4.0 x 2.1 x 2.2 cm (series 14, image 10). This extends to the dorsal skin surface (series 12, image 26). Prominent dorsal and medial forefoot soft tissue swelling extending into the first and second toes. No soft tissue mass. IMPRESSION: 1. Plantar forefoot soft tissue ulceration with sinus tract extending to the first MTP joint. 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 2. Large 4.0 cm abscess dorsal to the first MTP joint extending to the skin surface. 3. Expanded and ill-defined flexor hallucis longus tendon at the level of the first metatarsal head with associated tenosynovial enhancement, concerning for infectious tendinopathy/tenosynovitis. 4. Severe forefoot cellulitis extending into the first and second toes. Electronically Signed   By: WTitus DubinM.D.   On: 05/08/2022 16:03    Blood pressure (!) 137/98, pulse 72, temperature (!) 97.4 F (36.3 C), resp. rate 17, height 6' (1.829 m), weight (!) 145.2 kg, SpO2 97 %.  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 -X-ray imaging reviewed, postop, WNL.  Reviewed with patient in detail. -Incision site appears well coapted with sutures intact, no active bleeding, mild serous drainage, no purulence.  Greatly reduced erythema and edema. -Removed packing, no additional purulence or fluctuance palpable. -Reapplied xeroform, 4x4, abd, kerlix, ace with compression. -Continue NWB to right foot heel contact in surgical shoe for transfers only.  Would prefer he stays NWB to right foot for a few weeks with heel contact in surgical shoe for transfers for a few weeks to improve chances of healing. -Appreciate medicine recs for IV Abx.  Believe amputation likely curative of infection.  Closing bone culture with no growth to date.  Path pending.  Wound culture growing strep agalactiae and klebsisella.  Could potentially d/c on 7 day course of keflex/cipro.  Would rec IV Abx through the weekend and revisit on Monday for dressing change, Abx final recs, and possible discharge. -PT recs appreciated.  ACaroline More DPM 05/10/2022,  9:26 AM

## 2022-05-10 NOTE — Progress Notes (Signed)
[facility] [place] NAME: Cody Burke    MR#:  [id number]  DATE OF BIRTH:  28-Jul-1965  SUBJECTIVE:  patient was admitted from podiatry office with increasing infection and swelling of the right foot.  POD #1 overall doing well no pain   VITALS:  Blood pressure (!) 137/98, pulse 72, temperature (!) 97.4 F (36.3 C), resp. rate 17, height 6' (1.829 m), weight (!) 145.2 kg, SpO2 97 %.  PHYSICAL EXAMINATION:   GENERAL:  56 y.o.-year-old patient lying in the bed with no acute distress. Obese LUNGS: Normal breath sounds bilaterally, no wheezing CARDIOVASCULAR: S1, S2 normal. No murmur   ABDOMEN: Soft, nontender, nondistended. Bowel sounds present.  EXTREMITIES:     NEUROLOGIC: nonfocal  patient is alert and awake SKIN: as above  LABORATORY PANEL:  CBC Recent Labs  Lab 05/09/22 0438  WBC 16.1*  HGB 13.8  HCT 41.9  PLT 281     Chemistries  Recent Labs  Lab 05/08/22 0954 05/09/22 0438 05/10/22 0723  NA 135 135  --   K 4.2 3.9  --   CL 107 108  --   CO2 22 20*  --   GLUCOSE 221* 157*  --   BUN 19 14  --   CREATININE 0.83 0.74 0.68  CALCIUM 9.3 8.1*  --   AST 24  --   --   ALT 28  --   --   ALKPHOS 96  --   --   BILITOT 1.0  --   --     Cardiac Enzymes No results for input(s): "TROPONINI" in the last 168 hours. RADIOLOGY:  DG Foot Complete Right  Result Date: 05/09/2022 CLINICAL DATA:  Right foot surgery EXAM: RIGHT FOOT COMPLETE - 3+ VIEW COMPARISON:  MRI 05/08/2022 FINDINGS: Interval first ray resection to the level of the first metatarsal base. Antibiotic beads are seen within the resection bed. The remaining structures of the forefoot appear intact. No fracture or dislocation. Bidirectional calcaneal enthesophytes. Expected postoperative changes within the soft tissues of the medial forefoot. IMPRESSION: Interval first ray resection to the level of the first metatarsal base. No adverse postoperative findings.  Electronically Signed   By: Davina Poke D.O.   On: 05/09/2022 15:27   MR FOOT RIGHT W WO CONTRAST  Result Date: 05/08/2022 CLINICAL DATA:  Plantar first MTP joint ulcer. Status post debridement in October with fibular sesamoidectomy and first IP joint arthrodesis. EXAM: MRI OF THE RIGHT FOREFOOT WITHOUT AND WITH CONTRAST TECHNIQUE: Multiplanar, multisequence MR imaging of the right forefoot was performed before and after the administration of intravenous contrast. CONTRAST:  44m GADAVIST GADOBUTROL 1 MMOL/ML IV SOLN COMPARISON:  Intraoperative x-rays dated March 07, 2022. FINDINGS: Bones/Joint/Cartilage Fairly confluent marrow edema and enhancement with corresponding decreased T1 marrow signal involving the majority of the first metatarsal, sparing the base. Patchy marrow edema and enhancement at the base of the first proximal phalanx with associated decreased T1 marrow signal. Findings are consistent with osteomyelitis. Large first MTP joint effusion. No acute fracture or dislocation. Prior first IP joint arthrodesis. No joint effusion. Ligaments Second through fifth toe collateral ligaments are intact. Muscles and Tendons Expanded and ill-defined flexor hallucis longus tendon at the level of the first metatarsal head with associated tenosynovial enhancement. Increased T2 signal within and atrophy of the intrinsic muscles of the forefoot, nonspecific, but likely related to diabetic muscle changes. Soft tissue Plantar forefoot soft tissue ulceration with sinus tract extending to  the first MTP joint (series 12, image 22). Large multiloculated rim enhancing fluid collection dorsal to the first MTP joint, measuring 4.0 x 2.1 x 2.2 cm (series 14, image 10). This extends to the dorsal skin surface (series 12, image 26). Prominent dorsal and medial forefoot soft tissue swelling extending into the first and second toes. No soft tissue mass. IMPRESSION: 1. Plantar forefoot soft tissue ulceration with sinus  tract extending to the first MTP joint. 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 2. Large 4.0 cm abscess dorsal to the first MTP joint extending to the skin surface. 3. Expanded and ill-defined flexor hallucis longus tendon at the level of the first metatarsal head with associated tenosynovial enhancement, concerning for infectious tendinopathy/tenosynovitis. 4. Severe forefoot cellulitis extending into the first and second toes. Electronically Signed   By: Titus Dubin M.D.   On: 05/08/2022 16:03    Assessment and Plan Cody Burke is a 56 y.o. male with medical history significant of hypertension, hyperlipidemia, diabetes mellitus, morbid obesity with BMI of 43.4, PVD, who presents with right foot ulcer with infection.    X-rays were obtained in office which revealed concerns for osteomyelitis  MRI right foot 1. Plantar forefoot soft tissue ulceration with sinus tract extending to the first MTP joint. 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 2. Large 4.0 cm abscess dorsal to the first MTP joint extending to the skin surface. 3. Expanded and ill-defined flexor hallucis longus tendon at the level of the first metatarsal head with associated tenosynovial enhancement, concerning for infectious tendinopathy/tenosynovitis. 4. Severe forefoot cellulitis extending into the first and second toes.  Severe sepsis due to diabetic right foot ulcer with osteomyelitis [facility]): pt meets criteria for severe sepsis with WBC 17.2, heart rate of 114.  Lactic acid is elevated 2.1 which has normalized to 1.8 after giving IVF in ED. --Dr. Luana Shu of podiatry  consulted --12/15--pt is s/pRight first metatarsal phalangeal joint septic joint infection with associated osteomyelitis, cellulitis, and diabetic foot ulcerations - Empiric antimicrobial treatment with vancomycin and cefepime  - Blood  cultures x 2--negative  --WC kleb and strept agalactiae --Deep surgical tissue/bone --no wbc, no bacteria --MRSA PCR neg-d/c vanc. D/w pharmacy   Hypertension: -IV hydralazine as needed - resume lotensin  Hyperlipidemia -Lipitor   Diabetes mellitus without complication [facility]): Recent A1c 8.9, poorly controlled.  Patient taking metformin and Farxiga at home -SSI and po home meds   PVD (peripheral vascular disease) ([place]) -Aspirin and Lipitor   Obesity, Class III, BMI 40-49.9 (morbid obesity) ([place]): Patient is taking Farxigar at home.  BMI 43.40, body weight 145.2 kg -Exercise and healthy diet -Encouraged to lose weight    Procedures:s/pRight first metatarsal phalangeal joint septic joint infection with associated osteomyelitis Family communication :wife Consults :Podiatry CODE STATUS: full DVT Prophylaxis :heparin Level of care: Med-Surg Status is: Inpatient Remains inpatient appropriate because: diabetic foot infection Dr Luana Shu recommends continue IV antibiotic over the weekend and hopefully discharge on Monday. PT OT to see patient.    TOTAL TIME TAKING CARE OF THIS PATIENT: 35 minutes.  >50% time spent on counselling and coordination of care  Note: This dictation was prepared with Dragon dictation along with smaller phrase technology. Any transcriptional errors that result from this process are unintentional.  Fritzi Mandes M.D    [facility]   CC: Primary care physician; Valerie Roys, DO

## 2022-05-11 DIAGNOSIS — M869 Osteomyelitis, unspecified: Secondary | ICD-10-CM | POA: Diagnosis not present

## 2022-05-11 DIAGNOSIS — E11621 Type 2 diabetes mellitus with foot ulcer: Secondary | ICD-10-CM | POA: Diagnosis not present

## 2022-05-11 DIAGNOSIS — L97509 Non-pressure chronic ulcer of other part of unspecified foot with unspecified severity: Secondary | ICD-10-CM | POA: Diagnosis not present

## 2022-05-11 DIAGNOSIS — E1169 Type 2 diabetes mellitus with other specified complication: Secondary | ICD-10-CM | POA: Diagnosis not present

## 2022-05-11 LAB — GLUCOSE, CAPILLARY
Glucose-Capillary: 121 mg/dL — ABNORMAL HIGH (ref 70–99)
Glucose-Capillary: 213 mg/dL — ABNORMAL HIGH (ref 70–99)
Glucose-Capillary: 181 mg/dL — ABNORMAL HIGH (ref 70–99)
Glucose-Capillary: 151 mg/dL — ABNORMAL HIGH (ref 70–99)

## 2022-05-11 LAB — CREATININE, SERUM
Creatinine, Ser: 0.73 mg/dL (ref 0.61–1.24)
GFR, Estimated: >60 mL/min (ref >60)

## 2022-05-11 NOTE — Evaluation (Signed)
Physical Therapy Evaluation Patient Details Name: Cody Burke MRN: [id number] DOB: 1966-01-22 Today's Date: 05/11/2022  History of Present Illness  Per H&P from 12/14:Cody Burke is a 56 y.o. male with medical history significant of hypertension, hyperlipidemia, diabetes mellitus, morbid obesity with BMI of 43.4, PVD, who presents with right foot ulcer with infection.     Patient states that he has right foot plantar ulcer for more than 5 months.  Patient has been following up with Dr. Vickki Muff of podiatry. He is status post right foot surgery for chronic ulceration. He states that his right foot infection has been worsening recently. His right foot is more swollen and erythematous. Pt was seen by Dr. Vickki Muff and arthrocentesis performed in the office today. It demonstrated frank purulence. Per report, X-rays were obtained in office which revealed concerns for osteomyelitis.  He was sent to the emergency department for admission and surgical debridement tomorrow.  Patient does not have significant pain in the right foot.  No fever or chills.  Patient denies chest pain, cough, shortness breath.  No nausea, vomiting, diarrhea or abdominal pain.  No symptoms of UTI. On 05/09/2022-Patient required the following surgery: PROCEDURE:  RIGHT FOOT INCISION AND DRAINAGE WITH REMOVAL OF ALL NONVIABLE AND NECROTIC TISSUES INCLUDING BONE  PARTIAL 1ST RAY AMPUTATION  ANTIBIOTIC BEAD APPLICATION  Clinical Impression  Patient presents in transport mode upon entering room- using post op shoe as instructed on right foot. He denied any pain throughout his evaluation today. He presents with some mild BUE and some LE muscle weakness and very limited mobility per MD order. He was able to follow all VC and demo on how to keep weight off forefoot and able to return demo on safe transfer with min guard today. He will benefit from skilled PT services to address his level of weakness and instruct in safe mobility procedures to maximize  his safety and independence with all mobility and decrease his risk of injury or falls.        Recommendations for follow up therapy are one component of a multi-disciplinary discharge planning process, led by the attending physician.  Recommendations may be updated based on patient status, additional functional criteria and insurance authorization.  Follow Up Recommendations Home health PT      Assistance Recommended at Discharge Intermittent Supervision/Assistance  Patient can return home with the following  A little help with walking and/or transfers;A little help with bathing/dressing/bathroom;Assistance with cooking/housework;Help with stairs or ramp for entrance;Assist for transportation    Equipment Recommendations None recommended by PT  Recommendations for Other Services       Functional Status Assessment Patient has had a recent decline in their functional status and demonstrates the ability to make significant improvements in function in a reasonable and predictable amount of time.     Precautions / Restrictions Restrictions Weight Bearing Restrictions: Yes RLE Weight Bearing: Non weight bearing Other Position/Activity Restrictions: Per Dr. Caroline More instruction in order- May use heel contact for transfers or short distances but needs to stay off right foot for most part at all times.      Mobility  Bed Mobility Overal bed mobility: Modified Independent (use of railing)               Patient Response: Cooperative  Transfers Overall transfer level: Modified independent (BUE support on arm rest)                 General transfer comment: use of unloading post-op shoe right foot  Ambulation/Gait Ambulation/Gait assistance: Modified independent (Device/Increase time) Gait Distance (Feet): 10 Feet (MD restricted ambulation to transfers and short distances to stay off right foot for healing.) Assistive device: None Gait Pattern/deviations: Step-to  pattern, Decreased step length - left, Decreased step length - right   Gait velocity interpretation: <1.8 ft/sec, indicate of risk for recurrent falls   General Gait Details: use of post- op shoe to maintain NWB to right forefoot- only able to walk on heel  Stairs            Wheelchair Mobility    Modified Rankin (Stroke Patients Only)       Balance Overall balance assessment: Modified Independent, Mild deficits observed, not formally tested                                           Pertinent Vitals/Pain Pain Assessment Pain Assessment: No/denies pain    Home Living Family/patient expects to be discharged to:: Private residence Living Arrangements: Spouse/significant other Available Help at Discharge: Family;Available 24 hours/day Type of Home: House Home Access: Stairs to enter Entrance Stairs-Rails: Can reach both Entrance Stairs-Number of Steps: 2   Home Layout: One level Home Equipment: Conservation officer, nature (2 wheels);Other (comment) Additional Comments: has knee scooter    Prior Function Prior Level of Function : Independent/Modified Independent                     Hand Dominance   Dominant Hand: Right    Extremity/Trunk Assessment   Upper Extremity Assessment Upper Extremity Assessment: Generalized weakness    Lower Extremity Assessment Lower Extremity Assessment: Generalized weakness    Cervical / Trunk Assessment Cervical / Trunk Assessment: Normal  Communication   Communication: No difficulties  Cognition Arousal/Alertness: Awake/alert Behavior During Therapy: WFL for tasks assessed/performed Overall Cognitive Status: Within Functional Limits for tasks assessed                                          General Comments General comments (skin integrity, edema, etc.): Limited standing- heel contact only on right foot    Exercises     Assessment/Plan    PT Assessment Patient needs continued PT  services  PT Problem List Decreased strength;Decreased mobility;Decreased activity tolerance;Decreased balance;Impaired sensation;Decreased skin integrity       PT Treatment Interventions Gait training;Therapeutic activities;Therapeutic exercise;Balance training;Stair training;Functional mobility training;Neuromuscular re-education    PT Goals (Current goals can be found in the Care Plan section)  Acute Rehab PT Goals Patient Stated Goal: Be mobile and heal fast PT Goal Formulation: With patient Time For Goal Achievement: 05/25/22 Potential to Achieve Goals: Good    Frequency 7X/week     Co-evaluation               AM-PAC PT "6 Clicks" Mobility  Outcome Measure Help needed turning from your back to your side while in a flat bed without using bedrails?: A Little Help needed moving from lying on your back to sitting on the side of a flat bed without using bedrails?: A Little Help needed moving to and from a bed to a chair (including a wheelchair)?: A Little Help needed standing up from a chair using your arms (e.g., wheelchair or bedside chair)?: A Little Help needed to walk in hospital room?:  A Little Help needed climbing 3-5 steps with a railing? : A Little 6 Click Score: 18    End of Session Equipment Utilized During Treatment: Gait belt Activity Tolerance: Patient tolerated treatment well Patient left: in chair;with call bell/phone within reach;with family/visitor present Nurse Communication: Mobility status;Weight bearing status PT Visit Diagnosis: Unsteadiness on feet (R26.81);Other abnormalities of gait and mobility (R26.89);Muscle weakness (generalized) (M62.81);Difficulty in walking, not elsewhere classified (R26.2)    Time: 9980-6999 PT Time Calculation (min) (ACUTE ONLY): 24 min   Charges:   PT Evaluation $PT Eval Low Complexity: 1 Low PT Treatments $Gait Training: 8-22 mins         Lewis Moccasin, PT 05/11/2022, 1:43 PM

## 2022-05-11 NOTE — Progress Notes (Signed)
[facility] [place] NAME: Cody Burke    MR#:  [id number]  DATE OF BIRTH:  Jul 04, 1965  SUBJECTIVE:  patient was admitted from podiatry office with increasing infection and swelling of the right foot.  POD #2 overall doing well no pain Sugars better   VITALS:  Blood pressure (!) 149/82, pulse 77, temperature 97.7 F (36.5 C), resp. rate 16, height 6' (1.829 m), weight (!) 145.2 kg, SpO2 100 %.  PHYSICAL EXAMINATION:   GENERAL:  56 y.o.-year-old patient lying in the bed with no acute distress. Obese LUNGS: Normal breath sounds bilaterally, no wheezing CARDIOVASCULAR: S1, S2 normal. No murmur   ABDOMEN: Soft, nontender, nondistended. Bowel sounds present.  EXTREMITIES:     NEUROLOGIC: nonfocal  patient is alert and awake SKIN: as above  LABORATORY PANEL:  CBC Recent Labs  Lab 05/09/22 0438  WBC 16.1*  HGB 13.8  HCT 41.9  PLT 281     Chemistries  Recent Labs  Lab 05/08/22 0954 05/09/22 0438 05/10/22 0723 05/11/22 0414  NA 135 135  --   --   K 4.2 3.9  --   --   CL 107 108  --   --   CO2 22 20*  --   --   GLUCOSE 221* 157*  --   --   BUN 19 14  --   --   CREATININE 0.83 0.74   < > 0.73  CALCIUM 9.3 8.1*  --   --   AST 24  --   --   --   ALT 28  --   --   --   ALKPHOS 96  --   --   --   BILITOT 1.0  --   --   --    < > = values in this interval not displayed.     RADIOLOGY:  DG Foot Complete Right  Result Date: 05/09/2022 CLINICAL DATA:  Right foot surgery EXAM: RIGHT FOOT COMPLETE - 3+ VIEW COMPARISON:  MRI 05/08/2022 FINDINGS: Interval first ray resection to the level of the first metatarsal base. Antibiotic beads are seen within the resection bed. The remaining structures of the forefoot appear intact. No fracture or dislocation. Bidirectional calcaneal enthesophytes. Expected postoperative changes within the soft tissues of the medial forefoot. IMPRESSION: Interval first ray resection to the level of the  first metatarsal base. No adverse postoperative findings. Electronically Signed   By: Davina Poke D.O.   On: 05/09/2022 15:27    Assessment and Plan Cody Burke is a 56 y.o. male with medical history significant of hypertension, hyperlipidemia, diabetes mellitus, morbid obesity with BMI of 43.4, PVD, who presents with right foot ulcer with infection.    X-rays were obtained in office which revealed concerns for osteomyelitis  MRI right foot 1. Plantar forefoot soft tissue ulceration with sinus tract extending to the first MTP joint. 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 2. Large 4.0 cm abscess dorsal to the first MTP joint extending to the skin surface. 3. Expanded and ill-defined flexor hallucis longus tendon at the level of the first metatarsal head with associated tenosynovial enhancement, concerning for infectious tendinopathy/tenosynovitis. 4. Severe forefoot cellulitis extending into the first and second toes.  Severe sepsis due to diabetic right foot ulcer with osteomyelitis [facility]): pt meets criteria for severe sepsis with WBC 17.2, heart rate of 114.  Lactic acid is elevated 2.1 which has normalized  to 1.8 after giving IVF in ED. --Dr. Luana Shu of podiatry  consulted --12/15--pt is s/pRight first metatarsal phalangeal joint septic joint infection with associated osteomyelitis, cellulitis, and diabetic foot ulcerations - Empiric antimicrobial treatment with vancomycin and cefepime  - Blood cultures x 2--negative  --WC kleb and strept agalactiae --Deep surgical tissue/bone --no wbc, no bacteria --MRSA PCR neg-d/c vanc. D/w pharmacy --PT to work today   Hypertension: -IV hydralazine as needed - resume lotensin  Hyperlipidemia -Lipitor   Diabetes mellitus without complication [facility]): Recent A1c 8.9, poorly controlled.  Patient taking metformin and Farxiga at home -SSI and po home meds   PVD (peripheral vascular  disease) ([place]) -Aspirin and Lipitor   Obesity, Class III, BMI 40-49.9 (morbid obesity) ([place]): Patient is taking Farxigar at home.  BMI 43.40, body weight 145.2 kg -Exercise and healthy diet -Encouraged to lose weight    Procedures:s/pRight first metatarsal phalangeal joint septic joint infection with associated osteomyelitis Family communication :wife Consults :Podiatry CODE STATUS: full DVT Prophylaxis :heparin Level of care: Med-Surg Status is: Inpatient Remains inpatient appropriate because: diabetic foot infection Dr Luana Shu recommends continue IV antibiotic over the weekend and hopefully discharge on Monday. PT OT to see patient.    TOTAL TIME TAKING CARE OF THIS PATIENT: 35 minutes.  >50% time spent on counselling and coordination of care  Note: This dictation was prepared with Dragon dictation along with smaller phrase technology. Any transcriptional errors that result from this process are unintentional.  Fritzi Mandes M.D    [facility]   CC: Primary care physician; Valerie Roys, DO

## 2022-05-11 NOTE — Plan of Care (Signed)

## 2022-05-12 ENCOUNTER — Encounter: Payer: Self-pay | Admitting: Podiatry

## 2022-05-12 DIAGNOSIS — Z794 Long term (current) use of insulin: Secondary | ICD-10-CM

## 2022-05-12 DIAGNOSIS — I10 Essential (primary) hypertension: Secondary | ICD-10-CM | POA: Diagnosis not present

## 2022-05-12 DIAGNOSIS — Z7984 Long term (current) use of oral hypoglycemic drugs: Secondary | ICD-10-CM

## 2022-05-12 DIAGNOSIS — L97509 Non-pressure chronic ulcer of other part of unspecified foot with unspecified severity: Secondary | ICD-10-CM | POA: Diagnosis not present

## 2022-05-12 DIAGNOSIS — M8618 Other acute osteomyelitis, other site: Secondary | ICD-10-CM

## 2022-05-12 DIAGNOSIS — E7800 Pure hypercholesterolemia, unspecified: Secondary | ICD-10-CM

## 2022-05-12 DIAGNOSIS — Z87891 Personal history of nicotine dependence: Secondary | ICD-10-CM

## 2022-05-12 DIAGNOSIS — I739 Peripheral vascular disease, unspecified: Secondary | ICD-10-CM | POA: Diagnosis not present

## 2022-05-12 DIAGNOSIS — E1169 Type 2 diabetes mellitus with other specified complication: Secondary | ICD-10-CM | POA: Diagnosis not present

## 2022-05-12 DIAGNOSIS — E11621 Type 2 diabetes mellitus with foot ulcer: Secondary | ICD-10-CM | POA: Diagnosis not present

## 2022-05-12 LAB — GLUCOSE, CAPILLARY
Glucose-Capillary: 104 mg/dL — ABNORMAL HIGH (ref 70–99)
Glucose-Capillary: 124 mg/dL — ABNORMAL HIGH (ref 70–99)

## 2022-05-12 MED ORDER — AMOXICILLIN-POT CLAVULANATE 875-125 MG PO TABS: 1.0000 | ORAL_TABLET | Freq: Two times a day (BID) | ORAL | Status: DC

## 2022-05-12 MED ORDER — SULFAMETHOXAZOLE-TRIMETHOPRIM 800-160 MG PO TABS: 1.0000 | ORAL_TABLET | Freq: Two times a day (BID) | ORAL | 0 refills | Status: AC

## 2022-05-12 MED ORDER — ORAL CARE MOUTH RINSE: 15.0000 mL | OROMUCOSAL | Status: DC | PRN

## 2022-05-12 MED ORDER — AMOXICILLIN-POT CLAVULANATE 875-125 MG PO TABS: 1.0000 | ORAL_TABLET | Freq: Two times a day (BID) | ORAL | 0 refills | Status: AC

## 2022-05-12 MED ORDER — SULFAMETHOXAZOLE-TRIMETHOPRIM 800-160 MG PO TABS: 1.0000 | ORAL_TABLET | Freq: Two times a day (BID) | ORAL | Status: DC

## 2022-05-12 MED ORDER — OXYCODONE-ACETAMINOPHEN 5-325 MG PO TABS: 1.0000 | ORAL_TABLET | Freq: Three times a day (TID) | ORAL | 0 refills | Status: DC | PRN

## 2022-05-12 NOTE — Discharge Instructions (Signed)
Podiatry discharge instructions: 1.  Keep dressings clean, dry, and intact.  Recommend dressing changes every other day consisting of removing dressing and cleaning foot off with warm soapy water except avoid getting any moisture on the area of the procedure site.  Do not shower.  Once the dressing is removed and the foot has been cleaned would recommend placing Betadine paint on areas of macerated or wet looking tissue around the incisional area.  If it appears to be very moist and would recommend applying Betadine soaked gauze directly to the incision sites followed by 4 x 4 gauze, ABD pad, Kerlix, Ace wrap.  If it appears to be very dry then you can paint Betadine directly on the incision line and then apply nonadherent gauze such as Adaptic or Xeroform to the incision line followed by 4 x 4 gauze, ABD pad, Kerlix, Ace wrap.  Once again change every other day or for loosening or saturation. 2.  Recommend staying nonweightbearing for the most part at all times to the right lower extremity.  May use heel contact for transfers but would really recommend staying off the foot is much as possible. 3.  Take antibiotics as prescribed per infectious disease recommendations. 4.  Follow-up in clinic within 1 week of discharge date.

## 2022-05-12 NOTE — Consult Note (Addendum)
NAME: Cody Burke  DOB: Nov 08, 1965  MRN: [id number]  Date/Time: 05/12/2022 12:02 PM  REQUESTING PROVIDER: Dr.Baker Subjective:  REASON FOR CONSULT: rt great toe infection ? Cody Burke is a 56 y.o. with a history of DM, rt great toe arthrodesis Which he had in Oct 2023. Presents with infected ulcer rt great toe plantar surface Pt says he has had an ulcer on the met head on the plantar surface for many weeks now. This started like a callus when he wore tight steel toe boots- He was followed by podiatrist and underwent arthrodesis of the great toe. The surgical site healed well- the ulcer also was healing well until recently when it flared up and he saw podiatrist in his office and asked tot get admitted. He had a culture on 04/25/22 and it was klebsiella and enterobacter sensitive to bactrim. 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 Surgical culture so far GBS and staph lugdunesis As per Dr.Baker this is a therapeutic amputation I am seeing the patient for antibiotic management   Past Medical History:  Diagnosis Date   Diabetes mellitus without complication ([place])    Hyperlipidemia    Hypertension    Obesity    Peripheral vascular disease ([place])    Sleep apnea    severe - new DX - has not gotten CPAP yet    Past Surgical History:  Procedure Laterality Date   AMPUTATION Right 05/09/2022   Procedure: AMPUTATION FIRST RAY;  Surgeon: Caroline More, DPM;  Location: [facility];  Service: Podiatry;  Laterality: Right;   ARTHRODESIS METATARSALPHALANGEAL JOINT (MTPJ) Right 03/07/2022   Procedure: 24825 - Shauna Hugh;  Surgeon: Samara Deist, DPM;  Location: [facility];  Service: Podiatry;  Laterality: Right;   COLONOSCOPY WITH PROPOFOL N/A 09/21/2015   Procedure: COLONOSCOPY WITH PROPOFOL;  Surgeon: Lucilla Lame, MD;  Location: [place];  Service: Endoscopy;  Laterality: N/A;  Diabetic - oral meds sleep apnea   SESMOIDECTOMY Right 03/07/2022   Procedure: SESMOIDECTOMY;  Surgeon: Samara Deist, DPM;  Location: [facility];  Service: Podiatry;  Laterality: Right;   VASCULAR SURGERY Bilateral    legs   WOUND DEBRIDEMENT Right 03/07/2022   Procedure: DEBRIDEMENT OF RIGHT FOOT ULCER;  Surgeon: Samara Deist, DPM;  Location: [facility];  Service: Podiatry;  Laterality: Right;    Social History   Socioeconomic History   Marital status: Married    Spouse name: Not on file   Number of children: Not on file   Years of education: Not on file   Highest education level: Not on file  Occupational History   Not on file  Tobacco Use   Smoking status: Former    Types: Cigarettes    Quit date: 07/30/2003    Years since quitting: 18.7   Smokeless tobacco: Never  Vaping Use   Vaping Use: Never used  Substance and Sexual Activity   Alcohol use: Yes    Comment: very little   Drug use: No   Sexual activity: Not Currently  Other Topics Concern   Not on file  Social History Narrative   Not on file   Social Determinants of Health   Financial Resource Strain: Not on file  Food Insecurity: No Food Insecurity (05/08/2022)   Hunger Vital Sign    Worried About Running Out of Food in the Last Year: Never true    Ran Out of Food in the Last Year: Never true  Transportation Needs: No Transportation Needs (05/08/2022)   PRAPARE - Hydrologist (Medical): No    Lack of Transportation (Non-Medical): No   Physical Activity: Not on file  Stress: Not on file  Social Connections: Not on file  Intimate Partner Violence: Not At Risk (05/08/2022)   Humiliation, Afraid, Rape, and Kick questionnaire    Fear of Current or Ex-Partner: No    Emotionally Abused: No    Physically Abused: No    Sexually Abused: No    Family History  Problem Relation Age of Onset   Cancer Mother    Hypertension Father    No Known Allergies I? Current Facility-Administered Medications  Medication Dose Route Frequency Provider Last Rate Last Admin   acetaminophen (TYLENOL) tablet 650 mg  650 mg Oral Q6H PRN Ivor Costa, MD   650 mg at 05/10/22 2155   aspirin EC tablet 81 mg  81 mg Oral Daily Ivor Costa, MD   81 mg at 05/12/22 0842   atorvastatin (LIPITOR) tablet 20 mg  20 mg Oral Daily Ivor Costa, MD   20 mg at 05/12/22 0842   benazepril (LOTENSIN) tablet 40 mg  40 mg Oral Daily Fritzi Mandes, MD   40 mg at 05/12/22 0843   cefTRIAXone (ROCEPHIN) 2 g in sodium chloride 0.9 % 100 mL IVPB  2 g Intravenous Q24H Fritzi Mandes, MD   Stopped at 05/11/22 1610   dapagliflozin propanediol (FARXIGA) tablet 10 mg  10 mg Oral Daily Fritzi Mandes, MD   10 mg at 05/12/22 0842   heparin injection 5,000 Units  5,000 Units Subcutaneous Q8H Ivor Costa, MD   5,000 Units at 05/12/22 0646   insulin aspart (novoLOG) injection 0-5 Units  0-5 Units Subcutaneous QHS Ivor Costa, MD   3 Units at 05/09/22 2150   insulin aspart (novoLOG) injection 0-9 Units  0-9 Units Subcutaneous TID WC Ivor Costa, MD   2 Units at 05/11/22 1701   metFORMIN (GLUCOPHAGE-XR) 24 hr tablet 1,000 mg  1,000 mg Oral BID WC Fritzi Mandes, MD   1,000 mg at 05/12/22 6301   multivitamin with minerals tablet 1 tablet  1 tablet Oral Daily Ivor Costa, MD   1 tablet at 05/12/22 0846   omega-3 acid ethyl esters (LOVAZA) capsule 1 g  1 g Oral Daily Ivor Costa, MD   1 g at 05/12/22 0842   ondansetron (ZOFRAN) injection 4 mg  4 mg Intravenous Q8H PRN Ivor Costa, MD       Oral care mouth  rinse  15 mL Mouth Rinse PRN Fritzi Mandes, MD       oxyCODONE-acetaminophen (PERCOCET/ROXICET) 5-325 MG per tablet 1-2 tablet  1-2 tablet Oral Q6H PRN Caroline More, DPM         Abtx:  Anti-infectives (From admission, onward)    Start     Dose/Rate Route Frequency Ordered Stop   05/10/22 1500  cefTRIAXone (  ROCEPHIN) 2 g in sodium chloride 0.9 % 100 mL IVPB        2 g 200 mL/hr over 30 Minutes Intravenous Every 24 hours 05/10/22 1358     05/09/22 1233  vancomycin (VANCOCIN) powder  Status:  Discontinued          As needed 05/09/22 1233 05/09/22 1313   05/09/22 0000  vancomycin (VANCOREADY) IVPB 1750 mg/350 mL  Status:  Discontinued        1,750 mg 175 mL/hr over 120 Minutes Intravenous Every 12 hours 05/08/22 1202 05/10/22 1352   05/08/22 1400  ceFEPIme (MAXIPIME) 2 g in sodium chloride 0.9 % 100 mL IVPB  Status:  Discontinued        2 g 200 mL/hr over 30 Minutes Intravenous Every 8 hours 05/08/22 1204 05/10/22 1358   05/08/22 1145  vancomycin (VANCOREADY) IVPB 1500 mg/300 mL       See Hyperspace for full Linked Orders Report.   1,500 mg 150 mL/hr over 120 Minutes Intravenous  Once 05/08/22 1037 05/08/22 1901   05/08/22 1100  vancomycin (VANCOCIN) 2,500 mg in sodium chloride 0.9 % 500 mL IVPB  Status:  Discontinued        2,500 mg 262.5 mL/hr over 120 Minutes Intravenous  Once 05/08/22 1033 05/08/22 1035   05/08/22 1045  vancomycin (VANCOCIN) IVPB 1000 mg/200 mL premix       See Hyperspace for full Linked Orders Report.   1,000 mg 200 mL/hr over 60 Minutes Intravenous  Once 05/08/22 1037 05/08/22 1251   05/08/22 1030  cefTRIAXone (ROCEPHIN) 2 g in sodium chloride 0.9 % 100 mL IVPB  Status:  Discontinued        2 g 200 mL/hr over 30 Minutes Intravenous Every 24 hours 05/08/22 1024 05/08/22 1204       REVIEW OF SYSTEMS:  Const: negative fever, negative chills, negative weight loss Eyes: negative diplopia or visual changes, negative eye pain ENT: negative coryza, negative sore  throat Resp: negative cough, hemoptysis, dyspnea Cards: negative for chest pain, palpitations, lower extremity edema GU: negative for frequency, dysuria and hematuria GI: Negative for abdominal pain, diarrhea, bleeding, constipation Skin: negative for rash and pruritus Heme: negative for easy bruising and gum/nose bleeding MS: rt foot pain and discharge Neurolo:negative for headaches, dizziness, vertigo, memory problems  Psych: negative for feelings of anxiety, depression  Endocrine:  diabetes Allergy/Immunology- negative for any medication or food allergies ?  Objective:  VITALS:  BP (!) 143/83 (BP Location: Left Arm)   Pulse 65   Temp 97.7 F (36.5 C)   Resp 14   Ht 6' (1.829 m)   Wt (!) 145.2 kg   SpO2 99%   BMI 43.40 kg/m   PHYSICAL EXAM:  General: Alert, cooperative, no distress, appears stated age.  Head: Normocephalic, without obvious abnormality, atraumatic. Eyes: Conjunctivae clear, anicteric sclerae. Pupils are equal ENT Nares normal. No drainage or sinus tenderness. Lips, mucosa, and tongue normal. No Thrush Neck: Supple, symmetrical, no adenopathy, thyroid: non tender no carotid bruit and no JVD. Back: No CVA tenderness. Lungs: Clear to auscultation bilaterally. No Wheezing or Rhonchi. No rales. Heart: Regular rate and rhythm, no murmur, rub or gallop. Abdomen: Soft, non-tender,not distended. Bowel sounds normal. No masses Extremities: rt foot surgical dresisng not removed Pcitre reviewed Surgical site well approximated with no residual erythema . Infection     Before amputation   Skin: venous stasis and pigmentation both legs Lymph: Cervical, supraclavicular normal. Neurologic: Grossly non-focal Pertinent Labs Lab  Results CBC    Component Value Date/Time   WBC 16.1 (H) 05/09/2022 0438   RBC 4.66 05/09/2022 0438   HGB 13.8 05/09/2022 0438   HGB 16.3 04/11/2022 0816   HCT 41.9 05/09/2022 0438   HCT 49.1 04/11/2022 0816   PLT 281 05/09/2022  0438   PLT 238 04/11/2022 0816   MCV 89.9 05/09/2022 0438   MCV 90 04/11/2022 0816   MCH 29.6 05/09/2022 0438   MCHC 32.9 05/09/2022 0438   RDW 14.3 05/09/2022 0438   RDW 13.1 04/11/2022 0816   LYMPHSABS 4.6 (H) 05/08/2022 0954   LYMPHSABS 4.3 (H) 04/11/2022 0816   MONOABS 1.4 (H) 05/08/2022 0954   EOSABS 0.3 05/08/2022 0954   EOSABS 0.5 (H) 04/11/2022 0816   BASOSABS 0.2 (H) 05/08/2022 0954   BASOSABS 0.1 04/11/2022 0816       Latest Ref Rng & Units 05/11/2022    4:14 AM 05/10/2022    7:23 AM 05/09/2022    4:38 AM  CMP  Glucose 70 - 99 mg/dL   157   BUN 6 - 20 mg/dL   14   Creatinine 0.61 - 1.24 mg/dL 0.73  0.68  0.74   Sodium 135 - 145 mmol/L   135   Potassium 3.5 - 5.1 mmol/L   3.9   Chloride 98 - 111 mmol/L   108   CO2 22 - 32 mmol/L   20   Calcium 8.9 - 10.3 mg/dL   8.1       Microbiology: Recent Results (from the past 240 hour(s))  Culture, blood (routine x 2)     Status: None (Preliminary result)   Collection Time: 05/08/22  9:54 AM   Specimen: BLOOD  Result Value Ref Range Status   Specimen Description BLOOD RIGHT ANTECUBITAL  Final   Special Requests   Final    BOTTLES DRAWN AEROBIC AND ANAEROBIC Blood Culture adequate volume   Culture   Final    NO GROWTH 4 DAYS Performed at [facility], [address], [place], [place] [address]    Report Status PENDING  Incomplete  Body fluid culture w Gram Stain     Status: None   Collection Time: 05/08/22 10:00 AM   Specimen: Synovium; Body Fluid  Result Value Ref Range Status   Specimen Description   Final    SYNOVIAL Performed at [facility], [address] [place], [place] [address]    Special Requests   Final    ULCER OF RIGHT FOOT WITH LAT LAYER Performed at [facility], [address] [place], [place] [address]    Gram Stain   Final    MODERATE WBC PRESENT, PREDOMINANTLY PMN MODERATE GRAM POSITIVE COCCI IN CHAINS CRITICAL RESULT CALLED TO, READ BACK BY AND VERIFIED  WITH: RN TORI LABRADA ON 05/08/22 @ 2004 BY DRT    Culture   Final    ABUNDANT GROUP B STREP(S.AGALACTIAE)ISOLATED TESTING AGAINST S. AGALACTIAE NOT ROUTINELY PERFORMED DUE TO PREDICTABILITY OF AMP/PEN/VAN SUSCEPTIBILITY. ABUNDANT HAEMOPHILUS PARAINFLUENZAE BETA LACTAMASE NEGATIVE Performed at [facility], [place] [address], [place], [place] [address]    Report Status 05/10/2022 FINAL  Final  Culture, blood (routine x 2)     Status: None (Preliminary result)   Collection Time: 05/08/22 11:00 AM   Specimen: BLOOD  Result Value Ref Range Status   Specimen Description BLOOD LEFT ANTECUBITAL  Final   Special Requests   Final    BOTTLES DRAWN AEROBIC AND ANAEROBIC Blood Culture results  may not be optimal due to an inadequate volume of blood received in culture bottles   Culture   Final    NO GROWTH 4 DAYS Performed at [facility], [address] [place], [place] [address]    Report Status PENDING  Incomplete  Aerobic/Anaerobic Culture w Gram Stain (surgical/deep wound)     Status: None (Preliminary result)   Collection Time: 05/08/22  1:06 PM   Specimen: Wound  Result Value Ref Range Status   Specimen Description   Final    WOUND Performed at [facility], [address] [place], [place] [address]    Special Requests   Final    NONE Performed at [facility], [address] [place], [place] [address]    Gram Stain   Final    FEW WBC PRESENT, PREDOMINANTLY PMN FEW GRAM POSITIVE COCCI IN PAIRS RARE GRAM POSITIVE COCCI IN CHAINS Performed at [facility], [place] [address], [place], [place] [address]    Culture   Final    ABUNDANT GROUP B STREP(S.AGALACTIAE)ISOLATED TESTING AGAINST S. AGALACTIAE NOT ROUTINELY PERFORMED DUE TO PREDICTABILITY OF AMP/PEN/VAN SUSCEPTIBILITY. FEW KLEBSIELLA PNEUMONIAE NO ANAEROBES ISOLATED; CULTURE IN PROGRESS FOR 5 DAYS    Report Status PENDING  Incomplete   Organism ID, Bacteria KLEBSIELLA PNEUMONIAE  Final       Susceptibility   Klebsiella pneumoniae - MIC*    AMPICILLIN >=32 RESISTANT Resistant     CEFAZOLIN <=4 SENSITIVE Sensitive     CEFEPIME <=0.12 SENSITIVE Sensitive     CEFTAZIDIME <=1 SENSITIVE Sensitive     CEFTRIAXONE <=0.25 SENSITIVE Sensitive     CIPROFLOXACIN <=0.25 SENSITIVE Sensitive     GENTAMICIN <=1 SENSITIVE Sensitive     IMIPENEM <=0.25 SENSITIVE Sensitive     TRIMETH/SULFA <=20 SENSITIVE Sensitive     AMPICILLIN/SULBACTAM 4 SENSITIVE Sensitive     PIP/TAZO <=4 SENSITIVE Sensitive     * FEW KLEBSIELLA PNEUMONIAE  Surgical PCR screen     Status: None   Collection Time: 05/08/22  1:34 PM   Specimen: Nasal Mucosa; Nasal Swab  Result Value Ref Range Status   MRSA, PCR NEGATIVE NEGATIVE Final   Staphylococcus aureus NEGATIVE NEGATIVE Final    Comment: (NOTE) The Xpert SA Assay (FDA approved for NASAL specimens in patients 23 years of age and older), is one component of a comprehensive surveillance program. It is not intended to diagnose infection nor to guide or monitor treatment. Performed at [facility], [address] [place], [place] [address]   Aerobic/Anaerobic Culture w Gram Stain (surgical/deep wound)     Status: None (Preliminary result)   Collection Time: 05/09/22 12:36 PM   Specimen: PATH Other; Tissue  Result Value Ref Range Status   Specimen Description   Final    BONE Performed at [facility], [address] [place], [place] [address]    Special Requests   Final    RIGHT FIRST RAY BONE Performed at [facility], [place], [place] [address]    Gram Stain NO WBC SEEN NO ORGANISMS SEEN   Final   Culture   Final    RARE STAPHYLOCOCCUS LUGDUNENSIS RARE GROUP B STREP(S.AGALACTIAE)ISOLATED TESTING AGAINST S. AGALACTIAE NOT ROUTINELY PERFORMED DUE TO PREDICTABILITY OF AMP/PEN/VAN SUSCEPTIBILITY. NO ANAEROBES ISOLATED; CULTURE IN PROGRESS FOR 5 DAYS SUSCEPTIBILITIES TO FOLLOW Performed at [facility], [place] [address], [place], [place] [address]    Report Status PENDING  Incomplete  IMAGING RESULTS: MRI reviewed Rt foot- septic arthritis of the MTP Osteo- of 1st metatarsal phalanx I have personally reviewed the films ? 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 ___________________________________________________ Discussed with patient, wife and care team Note:  This document was prepared using Dragon voice recognition software and may include unintentional dictation errors.

## 2022-05-12 NOTE — Progress Notes (Signed)
Physical Therapy Treatment & Discharge  Patient Details Name: Cody Burke MRN: [id number] DOB: 1966-04-13 Today's Date: 05/12/2022   History of Present Illness 56 y/o male presented to ED on 05/08/22 for R foot infection. X-ray shows concerns for erosive changes to 1st metatarsal head consistent with osteomyelitis. Recent surgery on R foot due to chronic ulceration. S/p R partial 1st ray amputation on 12/15. PMH: T2DM, HTN, PVD, sleep apnea    PT Comments    Patient in bed on arrival and agreeable to PT tx session. Able to negotiate 2 stairs with R hand rail modI while maintaining heel WB on R foot in darco shoe. Discussed limiting walking to keep weight off of R forefoot and to allow better healing, patient verbalized understanding. Overall, patient is functioning at modI level for mobility with no AD and able to adequately maintain heel WB on R throughout. Patient has met all PT goals. No further skilled PT needs identified acutely. PT will sign off.      Recommendations for follow up therapy are one component of a multi-disciplinary discharge planning process, led by the attending physician.  Recommendations may be updated based on patient status, additional functional criteria and insurance authorization.  Follow Up Recommendations  Home health PT     Assistance Recommended at Discharge Intermittent Supervision/Assistance  Patient can return home with the following A little help with walking and/or transfers;A little help with bathing/dressing/bathroom;Assistance with cooking/housework;Help with stairs or ramp for entrance;Assist for transportation   Equipment Recommendations  None recommended by PT    Recommendations for Other Services       Precautions / Restrictions Precautions Precautions: Fall Required Braces or Orthoses: Other Brace Other Brace: darco shoe Restrictions Weight Bearing Restrictions: Yes RLE Weight Bearing: Non weight bearing Other Position/Activity  Restrictions: Per Dr. Caroline More instruction in order- May use heel contact for transfers or short distances but needs to stay off right foot for most part at all times.     Mobility  Bed Mobility Overal bed mobility: Modified Independent                  Transfers Overall transfer level: Modified independent                      Ambulation/Gait Ambulation/Gait assistance: Modified independent (Device/Increase time) Gait Distance (Feet): 10 Feet Assistive device: None Gait Pattern/deviations: Step-to pattern, Decreased step length - left, Decreased step length - right Gait velocity: decreased     General Gait Details: able to maintain heel WB on R with use of post op shoe   Stairs Stairs: Yes Stairs assistance: Modified independent (Device/Increase time) Stair Management: One rail Right, Step to pattern, Sideways Number of Stairs: 2 General stair comments: able to maintain heel WB on R during ascent and descent   Wheelchair Mobility    Modified Rankin (Stroke Patients Only)       Balance Overall balance assessment: Modified Independent                                          Cognition Arousal/Alertness: Awake/alert Behavior During Therapy: WFL for tasks assessed/performed Overall Cognitive Status: Within Functional Limits for tasks assessed  Exercises      General Comments        Pertinent Vitals/Pain Pain Assessment Pain Assessment: No/denies pain    Home Living                          Prior Function            PT Goals (current goals can now be found in the care plan section) Acute Rehab PT Goals Patient Stated Goal: to go home and heal fast PT Goal Formulation: All assessment and education complete, DC therapy Progress towards PT goals: Goals met/education completed, patient discharged from PT    Frequency    7X/week      PT Plan  Current plan remains appropriate    Co-evaluation              AM-PAC PT "6 Clicks" Mobility   Outcome Measure  Help needed turning from your back to your side while in a flat bed without using bedrails?: None Help needed moving from lying on your back to sitting on the side of a flat bed without using bedrails?: None Help needed moving to and from a bed to a chair (including a wheelchair)?: None Help needed standing up from a chair using your arms (e.g., wheelchair or bedside chair)?: None Help needed to walk in hospital room?: None Help needed climbing 3-5 steps with a railing? : None 6 Click Score: 24    End of Session   Activity Tolerance: Patient tolerated treatment well Patient left: in chair;with call bell/phone within reach Nurse Communication: Mobility status;Weight bearing status PT Visit Diagnosis: Unsteadiness on feet (R26.81);Other abnormalities of gait and mobility (R26.89);Muscle weakness (generalized) (M62.81);Difficulty in walking, not elsewhere classified (R26.2)     Time: 1130-1155 PT Time Calculation (min) (ACUTE ONLY): 25 min  Charges:  $Gait Training: 23-37 mins                     Ayodele Hartsock A. Gilford Rile PT, DPT [facility] - Acute Rehabilitation Services    Newton Frutiger A Teshara Moree 05/12/2022, 12:26 PM

## 2022-05-12 NOTE — Progress Notes (Signed)
PODIATRY / FOOT AND ANKLE SURGERY CONSULTATION NOTE  Requesting Physician: Dr. Blaine Hamper  Reason for consult: R foot infection  Chief Complaint: R foot wound, infection, cellulitis   HPI: Cody Burke is a 56 y.o. male who presents s/p 3 days right partial 1st ray amputation with antibiotic bead application and rotational skin flap closure with packing.  Patient had some mild strikethrough since last visit.  Patient was instructed on continued elevation and NWB with heel contact only for transfers.  He has been trying to adhere to this.  Patient notes no pain at this time.  PMHx:  Past Medical History:  Diagnosis Date   Diabetes mellitus without complication ([place])    Hyperlipidemia    Hypertension    Obesity    Peripheral vascular disease ([place])    Sleep apnea    severe - new DX - has not gotten CPAP yet    Surgical Hx:  Past Surgical History:  Procedure Laterality Date   ARTHRODESIS METATARSALPHALANGEAL JOINT (MTPJ) Right 03/07/2022   Procedure: [address];  Surgeon: Samara Deist, DPM;  Location: [facility];  Service: Podiatry;  Laterality: Right;   COLONOSCOPY WITH PROPOFOL N/A 09/21/2015   Procedure: COLONOSCOPY WITH PROPOFOL;  Surgeon: Lucilla Lame, MD;  Location: [place];  Service: Endoscopy;  Laterality: N/A;  Diabetic - oral meds sleep apnea   SESMOIDECTOMY Right 03/07/2022   Procedure: SESMOIDECTOMY;  Surgeon: Samara Deist, DPM;  Location: [facility];  Service: Podiatry;  Laterality: Right;   VASCULAR SURGERY Bilateral    legs   WOUND DEBRIDEMENT Right 03/07/2022   Procedure: DEBRIDEMENT OF RIGHT FOOT ULCER;  Surgeon: Samara Deist, DPM;  Location: [facility];  Service: Podiatry;  Laterality: Right;    FHx:  Family History  Problem Relation Age of Onset   Cancer Mother    Hypertension Father     Social History:  reports that he quit smoking about 18 years ago. His smoking use included cigarettes. He has never used smokeless tobacco. He reports  current alcohol use. He reports that he does not use drugs.  Allergies: No Known Allergies  Medications Prior to Admission  Medication Sig Dispense Refill   aspirin EC 81 MG tablet Take 81 mg by mouth daily.     atorvastatin (LIPITOR) 20 MG tablet Take 1 tablet (20 mg total) by mouth daily. 90 tablet 1   benazepril (LOTENSIN) 40 MG tablet Take 1 tablet (40 mg total) by mouth daily. 90 tablet 1   dapagliflozin propanediol (FARXIGA) 10 MG TABS tablet Take 10 mg by mouth daily. 30 tablet 3   metFORMIN (GLUCOPHAGE-XR) 500 MG 24 hr tablet Take 2 tablets (1,000 mg total) by mouth 2 (two) times daily. 360 tablet 1   Multiple Vitamins-Minerals (MULTIVITAMIN GUMMIES ADULT PO) Take by mouth daily.     Omega-3 Fatty Acids (FISH OIL) 1000 MG CAPS Take 1,000 mg by mouth daily.     glucose blood test strip Please dispense insurance preference. Use to check BS BID 200 each 4   Insulin Pen Needle (PEN NEEDLES) 31G X 8 MM MISC 1 each by Does not apply route once a week. 52 each 1     Physical Exam: General: Alert and oriented.  No apparent distress.  Vascular: DP/PT pulses +1 bilateral, capillary fill time appears to be intact to digits bilaterally.    Neuro: Light touch sensation absent to bilateral lower extremities.  Derm: R partial 1st ray amp site appears to have mild to moderate serosanguineous drainage  today out of the previous packing areas, incision line appears well coapted with sutures intact, CFT intact to flaps, reduced erythema and edema present.  No purulence or fluctuance present today.  Appears to be very stable.  Some of the antibiotic beads also are coming out the plantar surface of the foot near the area of the previously packed gauze.      MSK: R partial 1st ray amputation  Results for orders placed or performed during the hospital encounter of 05/08/22 (from the past 48 hour(s))  Glucose, capillary     Status: Abnormal   Collection Time: 05/10/22 11:32 AM  Result Value Ref  Range   Glucose-Capillary 221 (H) 70 - 99 mg/dL    Comment: Glucose reference range applies only to samples taken after fasting for at least 8 hours.  Glucose, capillary     Status: Abnormal   Collection Time: 05/10/22  4:08 PM  Result Value Ref Range   Glucose-Capillary 157 (H) 70 - 99 mg/dL    Comment: Glucose reference range applies only to samples taken after fasting for at least 8 hours.  Glucose, capillary     Status: Abnormal   Collection Time: 05/10/22  9:44 PM  Result Value Ref Range   Glucose-Capillary 147 (H) 70 - 99 mg/dL    Comment: Glucose reference range applies only to samples taken after fasting for at least 8 hours.  Creatinine, serum     Status: None   Collection Time: 05/11/22  4:14 AM  Result Value Ref Range   Creatinine, Ser 0.73 0.61 - 1.24 mg/dL   GFR, Estimated >60 >60 mL/min    Comment: (NOTE) Calculated using the CKD-EPI Creatinine Equation (2021) Performed at [facility], [address] [place], [place] [address]   Glucose, capillary     Status: Abnormal   Collection Time: 05/11/22  7:53 AM  Result Value Ref Range   Glucose-Capillary 121 (H) 70 - 99 mg/dL    Comment: Glucose reference range applies only to samples taken after fasting for at least 8 hours.  Glucose, capillary     Status: Abnormal   Collection Time: 05/11/22 12:11 PM  Result Value Ref Range   Glucose-Capillary 213 (H) 70 - 99 mg/dL    Comment: Glucose reference range applies only to samples taken after fasting for at least 8 hours.  Glucose, capillary     Status: Abnormal   Collection Time: 05/11/22  4:56 PM  Result Value Ref Range   Glucose-Capillary 181 (H) 70 - 99 mg/dL    Comment: Glucose reference range applies only to samples taken after fasting for at least 8 hours.  Glucose, capillary     Status: Abnormal   Collection Time: 05/11/22  9:47 PM  Result Value Ref Range   Glucose-Capillary 151 (H) 70 - 99 mg/dL    Comment: Glucose reference range applies only to  samples taken after fasting for at least 8 hours.  Glucose, capillary     Status: Abnormal   Collection Time: 05/12/22  8:05 AM  Result Value Ref Range   Glucose-Capillary 104 (H) 70 - 99 mg/dL    Comment: Glucose reference range applies only to samples taken after fasting for at least 8 hours.   No results found.  Blood pressure (!) 143/83, pulse 65, temperature 97.7 F (36.5 C), resp. rate 14, height 6' (1.829 m), weight (!) 145.2 kg, SpO2 99 %.  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 -X-ray imaging reviewed, postop, WNL.  Reviewed with patient in detail. -Incision site appears well coapted with sutures intact, no active bleeding, mild to moderate serosanguineous drainage, no purulence.  Greatly reduced erythema and edema. -Apply Betadine paint to the incisional area followed by xeroform to incision lines, 4x4, abd, kerlix, Coban with compression.  For discharge and recommend that patient change the dressing every other day removing the dressing and cleaning the foot off with warm soapy water avoiding the incision.  Patient is to apply Betadine paint to areas of macerated tissue followed by nonadherent gauze to the incision line, 4 x 4 gauze, ABD, Kerlix, Ace wrap.  If appears to be very macerated would recommend usage of Betadine gauze directly to the incision site.  Instructions placed in discharge instructions. -Continue NWB to right foot heel contact in surgical shoe for transfers only.  Would prefer he stays NWB to right foot for a few weeks with heel contact in surgical shoe for transfers for a few weeks to improve chances of healing. -Appreciate medicine recs for IV Abx.  Believe amputation likely curative of infection.  Closing bone culture  growing staph lugdunensis and strep agalactiae, unsure if contaminant versus true infection.  No obvious signs of infection present today when assessing and looking at the foot itself.  Path still pending.  Wound culture growing strep agalactiae and klebsisella.  Due to issues with closing bone culture, consultation placed with infectious disease, discussed with Dr. Steva Ready.  Defer to her recommendations for antibiotic therapy. -PT recs appreciated.  Podiatry team to sign off at this time.  Once patient has antibiotic regimen figured out can likely be discharged home.  Will monitor peripherally at this time until discharge.  If patient is still in house tomorrow we will see for further dressing change.  Caroline More, DPM 05/12/2022, 9:10 AM

## 2022-05-12 NOTE — Discharge Summary (Signed)
Physician Discharge Summary   Patient: Cody Burke MRN: [id number] DOB: 03-21-1966  Admit date:     05/08/2022  Discharge date: 05/12/22  Discharge Physician: Fritzi Mandes   PCP: Valerie Roys, DO   Recommendations at discharge:   patient follow-up with Dr. Cleda Mccreedy podiatry as outpatient continued dressing changes as per instruction by Dr. Luana Shu follow-up PCP in 1 to 2 weeks  Discharge Diagnoses: Principal Problem:   Diabetic foot ulcer with osteomyelitis ([place]) Active Problems:   Severe sepsis ([place])   Hypertension   Hyperlipidemia   Diabetes mellitus without complication ([place])   PVD (peripheral vascular disease) ([place])   Obesity, Class III, BMI 40-49.9 (morbid obesity) [facility])   Hospital Course:  Cody Burke is a 56 y.o. male with medical history significant of hypertension, hyperlipidemia, diabetes mellitus, morbid obesity with BMI of 43.4, PVD, who presents with right foot ulcer with infection.     X-rays were obtained in office which revealed concerns for osteomyelitis  MRI right foot 1. Plantar forefoot soft tissue ulceration with sinus tract extending to the first MTP joint. 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 2. Large 4.0 cm abscess dorsal to the first MTP joint extending to the skin surface. 3. Expanded and ill-defined flexor hallucis longus tendon at the level of the first metatarsal head with associated tenosynovial enhancement, concerning for infectious tendinopathy/tenosynovitis. 4. Severe forefoot cellulitis extending into the first and second toes.   Severe sepsis due to diabetic right foot ulcer with osteomyelitis [facility]): pt meets criteria for severe sepsis with WBC 17.2, heart rate of 114.  Lactic acid is elevated 2.1 which has normalized to 1.8 after giving IVF in ED. --Dr. Luana Shu of podiatry  consulted --12/15--pt is s/pRight first metatarsal phalangeal joint septic joint infection with  associated osteomyelitis, cellulitis, and diabetic foot ulcerations - Empiric antimicrobial treatment with vancomycin and cefepime  - Blood cultures x 2--negative  --WC kleb and strept agalactiae --Deep surgical tissue/bone --no wbc, no bacteria --MRSA PCR neg-d/c vanc. D/w pharmacy --PT to work today -- patient seen by infectious disease and recommends Augmentin and Bactrim for 10 days. Patient will follow-up with podiatry as outpatient. Dressing instructions given.   Hypertension: -IV hydralazine as needed - resume lotensin   Hyperlipidemia -Lipitor   Diabetes mellitus without complication [facility]): Recent A1c 8.9, poorly controlled.  Patient taking metformin and Farxiga at home -SSI and po home meds   PVD (peripheral vascular disease) ([place]) -Aspirin and Lipitor   Obesity, Class III, BMI 40-49.9 (morbid obesity) ([place]): Patient is taking Farxigar at home.  BMI 43.40, body weight 145.2 kg -Exercise and healthy diet -Encouraged to lose weight     Procedures:s/pRight first metatarsal phalangeal joint septic joint infection with associated osteomyelitis Family communication :wife Consults :Podiatry CODE STATUS: full DVT Prophylaxis :heparin Level of care: Med-Surg overall stable. Discharged to home.    Pain control - [employer] Controlled Substance Reporting System database was reviewed. and patient was instructed, not to drive, operate heavy machinery, perform activities at heights, swimming or participation in water activities or provide baby-sitting services while on Pain, Sleep and Anxiety Medications; until their outpatient Physician has advised to do so again. Also recommended to not to take more than prescribed Pain, Sleep and Anxiety Medications.  Diet recommendation:  Discharge Diet Orders (From admission, onward)     Start     Ordered   05/12/22 0000  Diet - low sodium heart healthy  05/12/22 1507           Cardiac and Carb modified diet DISCHARGE  MEDICATION: Allergies as of 05/12/2022   No Known Allergies      Medication List     TAKE these medications    amoxicillin-clavulanate 875-125 MG tablet Commonly known as: AUGMENTIN Take 1 tablet by mouth every 12 (twelve) hours for 10 days.   aspirin EC 81 MG tablet Take 81 mg by mouth daily.   atorvastatin 20 MG tablet Commonly known as: LIPITOR Take 1 tablet (20 mg total) by mouth daily.   benazepril 40 MG tablet Commonly known as: LOTENSIN Take 1 tablet (40 mg total) by mouth daily.   dapagliflozin propanediol 10 MG Tabs tablet Commonly known as: Farxiga Take 10 mg by mouth daily.   Fish Oil 1000 MG Caps Take 1,000 mg by mouth daily.   glucose blood test strip Please dispense insurance preference. Use to check BS BID   metFORMIN 500 MG 24 hr tablet Commonly known as: GLUCOPHAGE-XR Take 2 tablets (1,000 mg total) by mouth 2 (two) times daily.   MULTIVITAMIN GUMMIES ADULT PO Take by mouth daily.   oxyCODONE-acetaminophen 5-325 MG tablet Commonly known as: PERCOCET/ROXICET Take 1 tablet by mouth every 8 (eight) hours as needed for severe pain.   Pen Needles 31G X 8 MM Misc 1 each by Does not apply route once a week.   sulfamethoxazole-trimethoprim 800-160 MG tablet Commonly known as: BACTRIM DS Take 1 tablet by mouth every 12 (twelve) hours for 10 days.               Discharge Care Instructions  (From admission, onward)           Start     Ordered   05/12/22 0000  Discharge wound care:       Comments: For discharge and recommend that patient change the dressing every other day removing the dressing and cleaning the foot off with warm soapy water avoiding the incision.  Patient is to apply Betadine paint to areas of macerated tissue followed by nonadherent gauze to the incision line, 4 x 4 gauze, ABD, Kerlix, Ace wrap.  If appears to be very macerated would recommend usage of Betadine gauze directly to the incision site.  Instructions placed in  discharge instructions.   05/12/22 1507            Follow-up Information     Sharlotte Alamo, DPM. Schedule an appointment as soon as possible for a visit in 1 week(s).   Specialty: Podiatry Why: For wound re-check; Dr. Luana Shu will be out of town until 05/27/21. Contact information: [place] [address] [phone]         Park Liter P, DO. Schedule an appointment as soon as possible for a visit in 1 week(s).   Specialty: Family Medicine Why: hospital f/u Contact information: [place] [address] [phone]                Discharge Exam: Danley Danker Weights   05/08/22 0952  Weight: (!) 145.2 kg     Condition at discharge: fair  The results of significant diagnostics from this hospitalization (including imaging, microbiology, ancillary and laboratory) are listed below for reference.   Imaging Studies: DG Foot Complete Right  Result Date: 05/09/2022 CLINICAL DATA:  Right foot surgery EXAM: RIGHT FOOT COMPLETE - 3+ VIEW COMPARISON:  MRI 05/08/2022 FINDINGS: Interval first ray resection to the level of the first metatarsal base.  Antibiotic beads are seen within the resection bed. The remaining structures of the forefoot appear intact. No fracture or dislocation. Bidirectional calcaneal enthesophytes. Expected postoperative changes within the soft tissues of the medial forefoot. IMPRESSION: Interval first ray resection to the level of the first metatarsal base. No adverse postoperative findings. Electronically Signed   By: Davina Poke D.O.   On: 05/09/2022 15:27   MR FOOT RIGHT W WO CONTRAST  Result Date: 05/08/2022 CLINICAL DATA:  Plantar first MTP joint ulcer. Status post debridement in October with fibular sesamoidectomy and first IP joint arthrodesis. EXAM: MRI OF THE RIGHT FOREFOOT WITHOUT AND WITH CONTRAST TECHNIQUE: Multiplanar, multisequence MR imaging of the right forefoot was performed before and after the administration of  intravenous contrast. CONTRAST:  31m GADAVIST GADOBUTROL 1 MMOL/ML IV SOLN COMPARISON:  Intraoperative x-rays dated March 07, 2022. FINDINGS: Bones/Joint/Cartilage Fairly confluent marrow edema and enhancement with corresponding decreased T1 marrow signal involving the majority of the first metatarsal, sparing the base. Patchy marrow edema and enhancement at the base of the first proximal phalanx with associated decreased T1 marrow signal. Findings are consistent with osteomyelitis. Large first MTP joint effusion. No acute fracture or dislocation. Prior first IP joint arthrodesis. No joint effusion. Ligaments Second through fifth toe collateral ligaments are intact. Muscles and Tendons Expanded and ill-defined flexor hallucis longus tendon at the level of the first metatarsal head with associated tenosynovial enhancement. Increased T2 signal within and atrophy of the intrinsic muscles of the forefoot, nonspecific, but likely related to diabetic muscle changes. Soft tissue Plantar forefoot soft tissue ulceration with sinus tract extending to the first MTP joint (series 12, image 22). Large multiloculated rim enhancing fluid collection dorsal to the first MTP joint, measuring 4.0 x 2.1 x 2.2 cm (series 14, image 10). This extends to the dorsal skin surface (series 12, image 26). Prominent dorsal and medial forefoot soft tissue swelling extending into the first and second toes. No soft tissue mass. IMPRESSION: 1. Plantar forefoot soft tissue ulceration with sinus tract extending to the first MTP joint. 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 2. Large 4.0 cm abscess dorsal to the first MTP joint extending to the skin surface. 3. Expanded and ill-defined flexor hallucis longus tendon at the level of the first metatarsal head with associated tenosynovial enhancement, concerning for infectious tendinopathy/tenosynovitis. 4. Severe forefoot cellulitis  extending into the first and second toes. Electronically Signed   By: WTitus DubinM.D.   On: 05/08/2022 16:03    Microbiology: Results for orders placed or performed during the hospital encounter of 05/08/22  Culture, blood (routine x 2)     Status: None (Preliminary result)   Collection Time: 05/08/22  9:54 AM   Specimen: BLOOD  Result Value Ref Range Status   Specimen Description BLOOD RIGHT ANTECUBITAL  Final   Special Requests   Final    BOTTLES DRAWN AEROBIC AND ANAEROBIC Blood Culture adequate volume   Culture   Final    NO GROWTH 4 DAYS Performed at [facility] [address], [place] [address]   Report Status PENDING  Incomplete  Culture, blood (routine x 2)     Status: None (Preliminary result)   Collection Time: 05/08/22 11:00 AM   Specimen: BLOOD  Result Value Ref Range Status   Specimen Description BLOOD LEFT ANTECUBITAL  Final   Special Requests   Final    BOTTLES DRAWN AEROBIC AND ANAEROBIC Blood Culture results  may not be optimal due to an inadequate volume of blood received in culture bottles   Culture   Final    NO GROWTH 4 DAYS Performed at [facility], [address] [place], [place] [address]    Report Status PENDING  Incomplete  Aerobic/Anaerobic Culture w Gram Stain (surgical/deep wound)     Status: None (Preliminary result)   Collection Time: 05/08/22  1:06 PM   Specimen: Wound  Result Value Ref Range Status   Specimen Description   Final    WOUND Performed at [facility], [address] [place], [place] [address]    Special Requests   Final    NONE Performed at [facility], [address] [place], [place] [address]    Gram Stain   Final    FEW WBC PRESENT, PREDOMINANTLY PMN FEW GRAM POSITIVE COCCI IN PAIRS RARE GRAM POSITIVE COCCI IN CHAINS    Culture   Final    ABUNDANT GROUP B STREP(S.AGALACTIAE)ISOLATED TESTING AGAINST S. AGALACTIAE NOT ROUTINELY PERFORMED DUE TO PREDICTABILITY OF  AMP/PEN/VAN SUSCEPTIBILITY. FEW KLEBSIELLA PNEUMONIAE HOLDING FOR POSSIBLE ANAEROBE Performed at [facility], [place] [address] [place], [place] [address]    Report Status PENDING  Incomplete   Organism ID, Bacteria KLEBSIELLA PNEUMONIAE  Final      Susceptibility   Klebsiella pneumoniae - MIC*    AMPICILLIN >=32 RESISTANT Resistant     CEFAZOLIN <=4 SENSITIVE Sensitive     CEFEPIME <=0.12 SENSITIVE Sensitive     CEFTAZIDIME <=1 SENSITIVE Sensitive     CEFTRIAXONE <=0.25 SENSITIVE Sensitive     CIPROFLOXACIN <=0.25 SENSITIVE Sensitive     GENTAMICIN <=1 SENSITIVE Sensitive     IMIPENEM <=0.25 SENSITIVE Sensitive     TRIMETH/SULFA <=20 SENSITIVE Sensitive     AMPICILLIN/SULBACTAM 4 SENSITIVE Sensitive     PIP/TAZO <=4 SENSITIVE Sensitive     * FEW KLEBSIELLA PNEUMONIAE  Surgical PCR screen     Status: None   Collection Time: 05/08/22  1:34 PM   Specimen: Nasal Mucosa; Nasal Swab  Result Value Ref Range Status   MRSA, PCR NEGATIVE NEGATIVE Final   Staphylococcus aureus NEGATIVE NEGATIVE Final    Comment: (NOTE) The Xpert SA Assay (FDA approved for NASAL specimens in patients 29 years of age and older), is one component of a comprehensive surveillance program. It is not intended to diagnose infection nor to guide or monitor treatment. Performed at [facility], [address], [place], [place] [address]   Aerobic/Anaerobic Culture w Gram Stain (surgical/deep wound)     Status: None (Preliminary result)   Collection Time: 05/09/22 12:36 PM   Specimen: PATH Other; Tissue  Result Value Ref Range Status   Specimen Description   Final    BONE Performed at [facility], [address], [place], [place] [address]    Special Requests   Final    RIGHT FIRST RAY BONE Performed at [facility], [place], [place] [address]    Gram Stain NO WBC SEEN NO ORGANISMS SEEN   Final   Culture   Final    RARE STAPHYLOCOCCUS LUGDUNENSIS RARE  GROUP B STREP(S.AGALACTIAE)ISOLATED TESTING AGAINST S. AGALACTIAE NOT ROUTINELY PERFORMED DUE TO PREDICTABILITY OF AMP/PEN/VAN SUSCEPTIBILITY. NO ANAEROBES ISOLATED; CULTURE IN PROGRESS FOR 5 DAYS SUSCEPTIBILITIES TO FOLLOW Performed at [facility], [place] [address] [place],  [address]    Report Status PENDING  Incomplete    Labs: CBC: Recent Labs  Lab 05/08/22 0954 05/09/22 0438  WBC 17.2* 16.1*  NEUTROABS 10.4*  --   HGB 16.1 13.8  HCT 49.1 41.9  MCV 89.3 89.9  PLT 311 920   Basic Metabolic Panel: Recent Labs  Lab 05/08/22 0954 05/09/22 0438 05/10/22 0723 05/11/22 0414  NA 135 135  --   --   K 4.2 3.9  --   --   CL 107 108  --   --   CO2 22 20*  --   --   GLUCOSE 221* 157*  --   --   BUN 19 14  --   --   CREATININE 0.83 0.74 0.68 0.73  CALCIUM 9.3 8.1*  --   --    Liver Function Tests: Recent Labs  Lab 05/08/22 0954  AST 24  ALT 28  ALKPHOS 96  BILITOT 1.0  PROT 8.1  ALBUMIN 3.2*   CBG: Recent Labs  Lab 05/11/22 1211 05/11/22 1656 05/11/22 2147 05/12/22 0805 05/12/22 1139  GLUCAP 213* 181* 151* 104* 124*    Discharge time spent: greater than 30 minutes.  Signed: Fritzi Mandes, MD [facility] 05/12/2022

## 2022-05-12 NOTE — Progress Notes (Signed)
   05/12/22 0844  TOC Assessment  TOC screening is complete Yes  Expected Discharge [place]  Final next level of care [place]  Barriers to Discharge Barriers Resolved  Living arrangements for the past 2 months Single Family Home  Lives with: Spouse  Current home services DME (rolling walker, knee scooter)  Discharge Planning Services CM Consult  DME Agency NA  [place] Arranged PT  [place]  Date [place] 05/12/22  Time [place]  Representative spoke with at [place]  Do you feel safe going back to the place where you live? Y  Permission granted to share information with  Yes, Verbal Permission Granted  Patient language and need for interpreter reviewed: Yes  Criminal Activity/Legal Involvement Pertinent to Current Situation/Hospitalization No - Comment as needed  Need for Family Participation in Patient Care Y  Care giver support system in place? Y  Appearance: Appears stated age  Attitude/Demeanor/Rapport Engaged  Affect (typically observed) Pleasant  Orientation:  Oriented to Self;Oriented to Place;Oriented to  Time;Oriented to Situation  Alcohol / Substance Use Not Applicable  Psych Involvement N      Spoke with the patient's wife in the room The patient has a rolling walker and Knee scooter at home and does not need additional DME I reached out to [facility] [place] for [facility] services I explained to the wife that the patient may have a copay but the [facility] agency will let them know if they do and how much it is The wife stated that they will get a shower chair on their own

## 2022-05-13 ENCOUNTER — Telehealth: Payer: Self-pay

## 2022-05-13 LAB — AEROBIC/ANAEROBIC CULTURE W GRAM STAIN (SURGICAL/DEEP WOUND)

## 2022-05-13 LAB — CULTURE, BLOOD (ROUTINE X 2)
Culture: NO GROWTH
Culture: NO GROWTH
Special Requests: ADEQUATE

## 2022-05-13 LAB — SURGICAL PATHOLOGY

## 2022-05-13 NOTE — Telephone Encounter (Signed)
Transition Care Management Follow-up Telephone Call Date of discharge and from where: [place] 05/12/2022 How have you been since you were released from the hospital? good Any questions or concerns? Yes  Items Reviewed: Did the pt receive and understand the discharge instructions provided? Yes  Medications obtained and verified? Yes  Other? No  Any new allergies since your discharge? No  Dietary orders reviewed? Yes Do you have support at home? Yes   Home Care and Equipment/Supplies: Were home health services ordered? yes If so, what is the name of the agency? unknown  Has the agency set up a time to come to the patient's home? no Were any new equipment or medical supplies ordered?  No What is the name of the medical supply agency? N/a Were you able to get the supplies/equipment? not applicable Do you have any questions related to the use of the equipment or supplies? No  Functional Questionnaire: (I = Independent and D = Dependent) ADLs: I  Bathing/Dressing- I  Meal Prep- D  Eating- I  Maintaining continence- I  Transferring/Ambulation- D  Managing Meds- I  Follow up appointments reviewed:  PCP Hospital f/u appt confirmed? Yes  Scheduled to see Dr Wynetta Emery on 05/16/2022 @ 9:40. [place] [facility] f/u appt confirmed? Yes  Scheduled to see ortho surgeon date unknown by patient, wife has appt times with her Are transportation arrangements needed? No  If their condition worsens, is the pt aware to call PCP or go to the Emergency Dept.? Yes Was the patient provided with contact information for the PCP's office or ED? Yes Was to pt encouraged to call back with questions or concerns? Yes Juanda Crumble, LPN [place] Direct Dial [phone]

## 2022-05-14 LAB — AEROBIC/ANAEROBIC CULTURE W GRAM STAIN (SURGICAL/DEEP WOUND): Gram Stain: NONE SEEN

## 2022-05-15 ENCOUNTER — Encounter: Payer: Self-pay | Admitting: Podiatry

## 2022-05-16 ENCOUNTER — Ambulatory Visit: Payer: BC Managed Care – PPO | Admitting: Family Medicine

## 2022-05-16 ENCOUNTER — Encounter: Payer: Self-pay | Admitting: Family Medicine

## 2022-05-16 VITALS — BP 127/80 | HR 87 | Temp 97.8°F | Wt 320.0 lb

## 2022-05-16 DIAGNOSIS — R6520 Severe sepsis without septic shock: Secondary | ICD-10-CM

## 2022-05-16 DIAGNOSIS — A419 Sepsis, unspecified organism: Secondary | ICD-10-CM

## 2022-05-16 DIAGNOSIS — M869 Osteomyelitis, unspecified: Secondary | ICD-10-CM | POA: Diagnosis not present

## 2022-05-16 DIAGNOSIS — L97509 Non-pressure chronic ulcer of other part of unspecified foot with unspecified severity: Secondary | ICD-10-CM | POA: Diagnosis not present

## 2022-05-16 DIAGNOSIS — E11621 Type 2 diabetes mellitus with foot ulcer: Secondary | ICD-10-CM

## 2022-05-16 DIAGNOSIS — E1169 Type 2 diabetes mellitus with other specified complication: Secondary | ICD-10-CM

## 2022-05-16 DIAGNOSIS — S98111A Complete traumatic amputation of right great toe, initial encounter: Secondary | ICD-10-CM | POA: Diagnosis not present

## 2022-05-16 NOTE — Progress Notes (Signed)
BP 127/80 (BP Location: Left Arm, Cuff Size: Normal)   Pulse 87   Temp 97.8 F (36.6 C) (Oral)   Wt (!) 320 lb (145.2 kg)   SpO2 99%   BMI 43.40 kg/m    Subjective:    Patient ID: Cody Burke, male    DOB: May 10, 1966, 56 y.o.   MRN: [id number]  HPI: Cody Burke is a 56 y.o. male  Chief Complaint  Patient presents with   Foot Problem    Diabetic Foot Infection   Hospitalization Follow-up   Transition of [facility] Follow up.   Hospital/Facility: [facility] D/C Physician: Dr. Posey Pronto D/C Date: 05/12/22  Records Requested: 05/16/22 Records Received: 05/16/22 Records Reviewed: 05/16/22  Diagnoses on Discharge:  Diabetic foot ulcer with osteomyelitis ([place])   Severe sepsis ([place])   Hypertension   Hyperlipidemia   Diabetes mellitus without complication ([place])   PVD (peripheral vascular disease) ([place])   Obesity, Class III, BMI 40-49.9 (morbid obesity) ([place])  Date of interactive Contact within 48 hours of discharge: 05/13/22 Contact was through: phone  Date of 7 day or 14 day face-to-face visit: 05/16/22   within 7 days  Outpatient Encounter Medications as of 05/16/2022  Medication Sig   amoxicillin-clavulanate (AUGMENTIN) 875-125 MG tablet Take 1 tablet by mouth every 12 (twelve) hours for 10 days.   aspirin EC 81 MG tablet Take 81 mg by mouth daily.   atorvastatin (LIPITOR) 20 MG tablet Take 1 tablet (20 mg total) by mouth daily.   benazepril (LOTENSIN) 40 MG tablet Take 1 tablet (40 mg total) by mouth daily.   dapagliflozin propanediol (FARXIGA) 10 MG TABS tablet Take 10 mg by mouth daily.   glucose blood test strip Please dispense insurance preference. Use to check BS BID   Insulin Pen Needle (PEN NEEDLES) 31G X 8 MM MISC 1 each by Does not apply route once a week.   metFORMIN (GLUCOPHAGE-XR) 500 MG 24 hr tablet Take 2 tablets (1,000 mg total) by mouth 2 (two) times daily.   Multiple Vitamins-Minerals (MULTIVITAMIN GUMMIES ADULT PO) Take by mouth daily.   Omega-3 Fatty  Acids (FISH OIL) 1000 MG CAPS Take 1,000 mg by mouth daily.   oxyCODONE-acetaminophen (PERCOCET/ROXICET) 5-325 MG tablet Take 1 tablet by mouth every 8 (eight) hours as needed for severe pain.   sulfamethoxazole-trimethoprim (BACTRIM DS) 800-160 MG tablet Take 1 tablet by mouth every 12 (twelve) hours for 10 days.   No facility-administered encounter medications on file as of 05/16/2022.  Per Hospitalist: "Cody Burke is a 56 y.o. male with medical history significant of hypertension, hyperlipidemia, diabetes mellitus, morbid obesity with BMI of 43.4, PVD, who presents with right foot ulcer with infection.     X-rays were obtained in office which revealed concerns for osteomyelitis  MRI right foot 1. Plantar forefoot soft tissue ulceration with sinus tract extending to the first MTP joint. 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 2. Large 4.0 cm abscess dorsal to the first MTP joint extending to the skin surface. 3. Expanded and ill-defined flexor hallucis longus tendon at the level of the first metatarsal head with associated tenosynovial enhancement, concerning for infectious tendinopathy/tenosynovitis. 4. Severe forefoot cellulitis extending into the first and second toes.   Severe sepsis due to diabetic right foot ulcer with osteomyelitis [facility]): pt meets criteria for severe sepsis with WBC 17.2, heart rate of 114.  Lactic acid is elevated 2.1 which has normalized to 1.8 after giving IVF in  ED. --Dr. Luana Shu of podiatry  consulted --12/15--pt is s/pRight first metatarsal phalangeal joint septic joint infection with associated osteomyelitis, cellulitis, and diabetic foot ulcerations - Empiric antimicrobial treatment with vancomycin and cefepime  - Blood cultures x 2--negative  --WC kleb and strept agalactiae --Deep surgical tissue/bone --no wbc, no bacteria --MRSA PCR neg-d/c vanc. D/w pharmacy --PT to work today -- patient  seen by infectious disease and recommends Augmentin and Bactrim for 10 days. Patient will follow-up with podiatry as outpatient. Dressing instructions given.   Hypertension: -IV hydralazine as needed - resume lotensin   Hyperlipidemia -Lipitor   Diabetes mellitus without complication [facility]): Recent A1c 8.9, poorly controlled.  Patient taking metformin and Farxiga at home -SSI and po home meds   PVD (peripheral vascular disease) ([place]) -Aspirin and Lipitor   Obesity, Class III, BMI 40-49.9 (morbid obesity) ([place]): Patient is taking Farxigar at home.  BMI 43.40, body weight 145.2 kg -Exercise and healthy diet -Encouraged to lose weight"  Diagnostic Tests Reviewed:   CLINICAL DATA:  Plantar first MTP joint ulcer. Status post debridement in October with fibular sesamoidectomy and first IP joint arthrodesis.   EXAM: MRI OF THE RIGHT FOREFOOT WITHOUT AND WITH CONTRAST   TECHNIQUE: Multiplanar, multisequence MR imaging of the right forefoot was performed before and after the administration of intravenous contrast.   CONTRAST:  79m GADAVIST GADOBUTROL 1 MMOL/ML IV SOLN   COMPARISON:  Intraoperative x-rays dated March 07, 2022.   FINDINGS: Bones/Joint/Cartilage   Fairly confluent marrow edema and enhancement with corresponding decreased T1 marrow signal involving the majority of the first metatarsal, sparing the base. Patchy marrow edema and enhancement at the base of the first proximal phalanx with associated decreased T1 marrow signal. Findings are consistent with osteomyelitis. Large first MTP joint effusion. No acute fracture or dislocation. Prior first IP joint arthrodesis. No joint effusion.   Ligaments   Second through fifth toe collateral ligaments are intact.   Muscles and Tendons Expanded and ill-defined flexor hallucis longus tendon at the level of the first metatarsal head with associated tenosynovial enhancement. Increased T2 signal within and atrophy of the  intrinsic muscles of the forefoot, nonspecific, but likely related to diabetic muscle changes.   Soft tissue Plantar forefoot soft tissue ulceration with sinus tract extending to the first MTP joint (series 12, image 22). Large multiloculated rim enhancing fluid collection dorsal to the first MTP joint, measuring 4.0 x 2.1 x 2.2 cm (series 14, image 10). This extends to the dorsal skin surface (series 12, image 26). Prominent dorsal and medial forefoot soft tissue swelling extending into the first and second toes. No soft tissue mass.   IMPRESSION: 1. Plantar forefoot soft tissue ulceration with sinus tract extending to the first MTP joint. 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 2. Large 4.0 cm abscess dorsal to the first MTP joint extending to the skin surface. 3. Expanded and ill-defined flexor hallucis longus tendon at the level of the first metatarsal head with associated tenosynovial enhancement, concerning for infectious tendinopathy/tenosynovitis. 4. Severe forefoot cellulitis extending into the first and second toes.  CLINICAL DATA:  Right foot surgery   EXAM: RIGHT FOOT COMPLETE - 3+ VIEW   COMPARISON:  MRI 05/08/2022   FINDINGS: Interval first ray resection to the level of the first metatarsal base. Antibiotic beads are seen within the resection bed. The remaining structures of the forefoot appear intact. No fracture or dislocation. Bidirectional calcaneal enthesophytes. Expected postoperative changes within  the soft tissues of the medial forefoot.   IMPRESSION: Interval first ray resection to the level of the first metatarsal base. No adverse postoperative findings.  Disposition: Home  Consults: Podiatry  Discharge Instructions:  patient follow-up with Dr. Cleda Mccreedy podiatry as outpatient continued dressing changes as per instruction by Dr. Luana Shu follow-up PCP in 1 to 2 weeks  Disease/illness  Education: Discussed today  Home Health/Community Services Discussions/Referrals: In place  Establishment or re-establishment of referral orders for community resources:  In place  Discussion with other health care providers: N/A  Assessment and Support of treatment regimen adherence: Good  Appointments Coordinated with: Patient  Education for self-management, independent living, and ADLs: Discussed today  Since getting out of the hospital, Jarone has been been doing well. Has been doing dressing changes. No pain. Has not needed any of his pain medicine. He notes that his sugars have OK, he has been checking 3x a day. They have generally been doing well, but he has had a couple of times where it's gone up. He feels like his [place]ade is doing OK. He is otherwise doing well with no other concerns or complaints at this time.   Relevant past medical, surgical, family and social history reviewed and updated as indicated. Interim medical history since our last visit reviewed. Allergies and medications reviewed and updated.  Review of Systems  Constitutional: Negative.   Respiratory: Negative.    Cardiovascular: Negative.   Gastrointestinal: Negative.   Musculoskeletal: Negative.   Skin: Negative.   Psychiatric/Behavioral: Negative.      Per HPI unless specifically indicated above     Objective:    BP 127/80 (BP Location: Left Arm, Cuff Size: Normal)   Pulse 87   Temp 97.8 F (36.6 C) (Oral)   Wt (!) 320 lb (145.2 kg)   SpO2 99%   BMI 43.40 kg/m   Wt Readings from Last 3 Encounters:  05/16/22 (!) 320 lb (145.2 kg)  05/08/22 (!) 320 lb (145.2 kg)  04/11/22 (!) 316 lb 11.2 oz (143.7 kg)    Physical Exam Vitals and nursing note reviewed.  Constitutional:      General: He is not in acute distress.    Appearance: Normal appearance. He is obese. He is not ill-appearing, toxic-appearing or diaphoretic.  HENT:     Head: Normocephalic and atraumatic.     Right Ear: External ear  normal.     Left Ear: External ear normal.     Nose: Nose normal.     Mouth/Throat:     Mouth: Mucous membranes are moist.     Pharynx: Oropharynx is clear.  Eyes:     General: No scleral icterus.       Right eye: No discharge.        Left eye: No discharge.     Extraocular Movements: Extraocular movements intact.     Conjunctiva/sclera: Conjunctivae normal.     Pupils: Pupils are equal, round, and reactive to light.  Cardiovascular:     Rate and Rhythm: Normal rate and regular rhythm.     Pulses: Normal pulses.     Heart sounds: Normal heart sounds. No murmur heard.    No friction rub. No gallop.  Pulmonary:     Effort: Pulmonary effort is normal. No respiratory distress.     Breath sounds: Normal breath sounds. No stridor. No wheezing, rhonchi or rales.  Chest:     Chest wall: No tenderness.  Musculoskeletal:        General: Normal range  of motion.     Cervical back: Normal range of motion and neck supple.  Skin:    General: Skin is warm and dry.     Capillary Refill: Capillary refill takes less than 2 seconds.     Coloration: Skin is not jaundiced or pale.     Findings: No bruising, erythema, lesion or rash.     Comments: R foot wrapped and in boot today  Neurological:     General: No focal deficit present.     Mental Status: He is alert and oriented to person, place, and time. Mental status is at baseline.  Psychiatric:        Mood and Affect: Mood normal.        Behavior: Behavior normal.        Thought Content: Thought content normal.        Judgment: Judgment normal.     Results for orders placed or performed during the hospital encounter of 05/08/22  Culture, blood (routine x 2)   Specimen: BLOOD  Result Value Ref Range   Specimen Description BLOOD RIGHT ANTECUBITAL    Special Requests      BOTTLES DRAWN AEROBIC AND ANAEROBIC Blood Culture adequate volume   Culture      NO GROWTH 5 DAYS Performed at [facility], [address] [place],  [place] [address]    Report Status 05/13/2022 FINAL   Culture, blood (routine x 2)   Specimen: BLOOD  Result Value Ref Range   Specimen Description BLOOD LEFT ANTECUBITAL    Special Requests      BOTTLES DRAWN AEROBIC AND ANAEROBIC Blood Culture results may not be optimal due to an inadequate volume of blood received in culture bottles   Culture      NO GROWTH 5 DAYS Performed at [facility], [address] [place], [place] [address]    Report Status 05/13/2022 FINAL   Aerobic/Anaerobic Culture w Gram Stain (surgical/deep wound)   Specimen: Wound  Result Value Ref Range   Specimen Description      WOUND Performed at [facility], [address] [place], [place] [address]    Special Requests      NONE Performed at [facility], [place], [place] [address]    Gram Stain      FEW WBC PRESENT, PREDOMINANTLY PMN FEW GRAM POSITIVE COCCI IN PAIRS RARE GRAM POSITIVE COCCI IN CHAINS Performed at [facility], [place] [address], [place], [place] [address]    Culture      ABUNDANT GROUP B STREP(S.AGALACTIAE)ISOLATED TESTING AGAINST S. AGALACTIAE NOT ROUTINELY PERFORMED DUE TO PREDICTABILITY OF AMP/PEN/VAN SUSCEPTIBILITY. FEW KLEBSIELLA PNEUMONIAE MIXED ANAEROBIC FLORA PRESENT.  CALL LAB IF FURTHER IID REQUIRED.    Report Status 05/13/2022 FINAL    Organism ID, Bacteria KLEBSIELLA PNEUMONIAE       Susceptibility   Klebsiella pneumoniae - MIC*    AMPICILLIN >=32 RESISTANT Resistant     CEFAZOLIN <=4 SENSITIVE Sensitive     CEFEPIME <=0.12 SENSITIVE Sensitive     CEFTAZIDIME <=1 SENSITIVE Sensitive     CEFTRIAXONE <=0.25 SENSITIVE Sensitive     CIPROFLOXACIN <=0.25 SENSITIVE Sensitive     GENTAMICIN <=1 SENSITIVE Sensitive     IMIPENEM <=0.25 SENSITIVE Sensitive     TRIMETH/SULFA <=20 SENSITIVE Sensitive     AMPICILLIN/SULBACTAM 4 SENSITIVE Sensitive     PIP/TAZO <=4 SENSITIVE Sensitive     * FEW KLEBSIELLA PNEUMONIAE  Surgical PCR  screen  Specimen: Nasal Mucosa; Nasal Swab  Result Value Ref Range   MRSA, PCR NEGATIVE NEGATIVE   Staphylococcus aureus NEGATIVE NEGATIVE  Aerobic/Anaerobic Culture w Gram Stain (surgical/deep wound)   Specimen: PATH Other; Tissue  Result Value Ref Range   Specimen Description      BONE Performed at [facility], [address], [place], [place] [address]    Special Requests      RIGHT FIRST RAY BONE Performed at [facility], [place], [place] [address]    Gram Stain NO WBC SEEN NO ORGANISMS SEEN     Culture      RARE STAPHYLOCOCCUS LUGDUNENSIS RARE GROUP B STREP(S.AGALACTIAE)ISOLATED TESTING AGAINST S. AGALACTIAE NOT ROUTINELY PERFORMED DUE TO PREDICTABILITY OF AMP/PEN/VAN SUSCEPTIBILITY. NO ANAEROBES ISOLATED Performed at [facility], [place] [address], [place], [place] [address]    Report Status 05/14/2022 FINAL    Organism ID, Bacteria STAPHYLOCOCCUS LUGDUNENSIS       Susceptibility   Staphylococcus lugdunensis - MIC*    CIPROFLOXACIN <=0.5 SENSITIVE Sensitive     ERYTHROMYCIN <=0.25 SENSITIVE Sensitive     GENTAMICIN <=0.5 SENSITIVE Sensitive     OXACILLIN 2 SENSITIVE Sensitive     TETRACYCLINE >=16 RESISTANT Resistant     VANCOMYCIN <=0.5 SENSITIVE Sensitive     TRIMETH/SULFA <=10 SENSITIVE Sensitive     CLINDAMYCIN <=0.25 SENSITIVE Sensitive     RIFAMPIN <=0.5 SENSITIVE Sensitive     Inducible Clindamycin NEGATIVE Sensitive     * RARE STAPHYLOCOCCUS LUGDUNENSIS  Lactic acid, plasma  Result Value Ref Range   Lactic Acid, Venous 2.1 (HH) 0.5 - 1.9 mmol/L  Lactic acid, plasma  Result Value Ref Range   Lactic Acid, Venous 1.8 0.5 - 1.9 mmol/L  Comprehensive metabolic panel  Result Value Ref Range   Sodium 135 135 - 145 mmol/L   Potassium 4.2 3.5 - 5.1 mmol/L   Chloride 107 98 - 111 mmol/L   CO2 22 22 - 32 mmol/L   Glucose, Bld 221 (H) 70 - 99 mg/dL   BUN 19 6 - 20 mg/dL   Creatinine, Ser 0.83 0.61 - 1.24 mg/dL    Calcium 9.3 8.9 - 10.3 mg/dL   Total Protein 8.1 6.5 - 8.1 g/dL   Albumin 3.2 (L) 3.5 - 5.0 g/dL   AST 24 15 - 41 U/L   ALT 28 0 - 44 U/L   Alkaline Phosphatase 96 38 - 126 U/L   Total Bilirubin 1.0 0.3 - 1.2 mg/dL   GFR, Estimated >60 >60 mL/min   Anion gap 6 5 - 15  CBC with Differential  Result Value Ref Range   WBC 17.2 (H) 4.0 - 10.5 K/uL   RBC 5.50 4.22 - 5.81 MIL/uL   Hemoglobin 16.1 13.0 - 17.0 g/dL   HCT 49.1 39.0 - 52.0 %   MCV 89.3 80.0 - 100.0 fL   MCH 29.3 26.0 - 34.0 pg   MCHC 32.8 30.0 - 36.0 g/dL   RDW 14.0 11.5 - 15.5 %   Platelets 311 150 - 400 K/uL   nRBC 0.1 0.0 - 0.2 %   Neutrophils Relative % 60 %   Neutro Abs 10.4 (H) 1.7 - 7.7 K/uL   Lymphocytes Relative 27 %   Lymphs Abs 4.6 (H) 0.7 - 4.0 K/uL   Monocytes Relative 8 %   Monocytes Absolute 1.4 (H) 0.1 - 1.0 K/uL   Eosinophils Relative 2 %   Eosinophils Absolute 0.3 0.0 - 0.5 K/uL  Basophils Relative 1 %   Basophils Absolute 0.2 (H) 0.0 - 0.1 K/uL   RBC Morphology MORPHOLOGY UNREMARKABLE    Smear Review Normal platelet morphology    Immature Granulocytes 2 %   Abs Immature Granulocytes 0.28 (H) 0.00 - 0.07 K/uL   Reactive, Benign Lymphocytes PRESENT    Smudge Cells PRESENT   Urinalysis, Routine w reflex microscopic Urine, Clean Catch  Result Value Ref Range   Color, Urine STRAW (A) YELLOW   APPearance CLEAR (A) CLEAR   Specific Gravity, Urine 1.027 1.005 - 1.030   pH 5.0 5.0 - 8.0   Glucose, UA >=500 (A) NEGATIVE mg/dL   Hgb urine dipstick NEGATIVE NEGATIVE   Bilirubin Urine NEGATIVE NEGATIVE   Ketones, ur 5 (A) NEGATIVE mg/dL   Protein, ur NEGATIVE NEGATIVE mg/dL   Nitrite NEGATIVE NEGATIVE   Leukocytes,Ua NEGATIVE NEGATIVE   RBC / HPF 0-5 0 - 5 RBC/hpf   WBC, UA 0-5 0 - 5 WBC/hpf   Bacteria, UA NONE SEEN NONE SEEN   Squamous Epithelial / LPF 0-5 0 - 5   Mucus PRESENT   Sedimentation rate  Result Value Ref Range   Sed Rate 45 (H) 0 - 20 mm/hr  C-reactive protein  Result Value Ref  Range   CRP 12.4 (H) <1.0 mg/dL  Protime-INR  Result Value Ref Range   Prothrombin Time 15.5 (H) 11.4 - 15.2 seconds   INR 1.2 0.8 - 1.2  APTT  Result Value Ref Range   aPTT 30 24 - 36 seconds  HIV Antibody (routine testing w rflx)  Result Value Ref Range   HIV Screen 4th Generation wRfx Non Reactive Non Reactive  Procalcitonin  Result Value Ref Range   Procalcitonin 0.10 ng/mL  Glucose, capillary  Result Value Ref Range   Glucose-Capillary 181 (H) 70 - 99 mg/dL  Basic metabolic panel  Result Value Ref Range   Sodium 135 135 - 145 mmol/L   Potassium 3.9 3.5 - 5.1 mmol/L   Chloride 108 98 - 111 mmol/L   CO2 20 (L) 22 - 32 mmol/L   Glucose, Bld 157 (H) 70 - 99 mg/dL   BUN 14 6 - 20 mg/dL   Creatinine, Ser 0.74 0.61 - 1.24 mg/dL   Calcium 8.1 (L) 8.9 - 10.3 mg/dL   GFR, Estimated >60 >60 mL/min   Anion gap 7 5 - 15  CBC  Result Value Ref Range   WBC 16.1 (H) 4.0 - 10.5 K/uL   RBC 4.66 4.22 - 5.81 MIL/uL   Hemoglobin 13.8 13.0 - 17.0 g/dL   HCT 41.9 39.0 - 52.0 %   MCV 89.9 80.0 - 100.0 fL   MCH 29.6 26.0 - 34.0 pg   MCHC 32.9 30.0 - 36.0 g/dL   RDW 14.3 11.5 - 15.5 %   Platelets 281 150 - 400 K/uL   nRBC 0.0 0.0 - 0.2 %  Glucose, capillary  Result Value Ref Range   Glucose-Capillary 208 (H) 70 - 99 mg/dL  Glucose, capillary  Result Value Ref Range   Glucose-Capillary 203 (H) 70 - 99 mg/dL  Vancomycin, random  Result Value Ref Range   Vancomycin Rm 9 ug/mL  Glucose, capillary  Result Value Ref Range   Glucose-Capillary 151 (H) 70 - 99 mg/dL  Glucose, capillary  Result Value Ref Range   Glucose-Capillary 159 (H) 70 - 99 mg/dL   Comment 1 Notify RN    Comment 2 Document in Chart   Glucose, capillary  Result Value Ref  Range   Glucose-Capillary 163 (H) 70 - 99 mg/dL  Creatinine, serum  Result Value Ref Range   Creatinine, Ser 0.68 0.61 - 1.24 mg/dL   GFR, Estimated >60 >60 mL/min  Glucose, capillary  Result Value Ref Range   Glucose-Capillary 316 (H) 70 -  99 mg/dL  Glucose, capillary  Result Value Ref Range   Glucose-Capillary 291 (H) 70 - 99 mg/dL   Comment 1 Notify RN   Glucose, capillary  Result Value Ref Range   Glucose-Capillary 197 (H) 70 - 99 mg/dL  Glucose, capillary  Result Value Ref Range   Glucose-Capillary 221 (H) 70 - 99 mg/dL  Glucose, capillary  Result Value Ref Range   Glucose-Capillary 157 (H) 70 - 99 mg/dL  Creatinine, serum  Result Value Ref Range   Creatinine, Ser 0.73 0.61 - 1.24 mg/dL   GFR, Estimated >60 >60 mL/min  Glucose, capillary  Result Value Ref Range   Glucose-Capillary 147 (H) 70 - 99 mg/dL  Glucose, capillary  Result Value Ref Range   Glucose-Capillary 121 (H) 70 - 99 mg/dL  Glucose, capillary  Result Value Ref Range   Glucose-Capillary 213 (H) 70 - 99 mg/dL  Glucose, capillary  Result Value Ref Range   Glucose-Capillary 181 (H) 70 - 99 mg/dL  Glucose, capillary  Result Value Ref Range   Glucose-Capillary 151 (H) 70 - 99 mg/dL  Glucose, capillary  Result Value Ref Range   Glucose-Capillary 104 (H) 70 - 99 mg/dL  Glucose, capillary  Result Value Ref Range   Glucose-Capillary 124 (H) 70 - 99 mg/dL  Type and screen [facility]  Result Value Ref Range   ABO/RH(D) O POS    Antibody Screen NEG    Sample Expiration      05/11/2022,2359 Performed at [facility], [address], [place], [place] [address]   Surgical pathology  Result Value Ref Range   SURGICAL PATHOLOGY      SURGICAL PATHOLOGY CASE: [id number] [phone] PATIENT: Ari Skarzynski Surgical Pathology Report     Specimen Submitted: A. Toe, right first foot  Clinical History: First digit ray toe amputation.  S/P right first toe amputation and removal of all nonviable tissue      DIAGNOSIS: A.  TOE, RIGHT FIRST RAY; AMPUTATION: - SOFT TISSUE WITH CHRONIC ULCERATION AND UNDERLYING ACUTE OSTEOMYELITIS. - VIABLE SOFT TISSUE AND CUT SURFACE OF BONE AT PROXIMAL MARGIN; NEGATIVE FOR ACUTE  OSTEOMYELITIS.  GROSS DESCRIPTION: A. Labeled: First ray right foot purple ink marks proximal margin Received: Fresh Collection time: 12:26 PM on 05/09/2022 Placed into formalin time: 12:40 PM on 05/09/2022 Size: 8.5 x 4.5 x 4 cm and 6.5 x 4 x 2 cm Description of lesion(s): Received is a portion of toe with a toenail. The skin is tan-white, focally lobulated, and friable with a 1.2 x 0.6 cm area of shallow ulceration on the proximal plantar surface. Additionally received in the same container is  a portion of tan firm bone with a disarticulated surface and opposing cut surface.  The cut surface is received inked purple and is presumed to be the true bone resection margin. Proximal margin: The proximal margin is inked blue and displays tan-white smooth skin, tan soft tissue, and tan firm bone.  The area of ulceration is 0.2 cm to the closest skin and soft tissue resection margin. Bone: The bone at the disarticulated aspect of the additional fragment, which is presumed to be underlying the ulceration when reapproximated, is pink-tan, slightly softened, and focally fragmented.  The remaining bone is tan and firm. Other findings: None grossly appreciated.  Block summary: 1 - 3 - cut bone at resection margin, perpendicularly sectioned and submitted entirely 4 - ulceration with closest approach to skin and soft tissue resection margins, perpendicularly sectioned 5 - representative bone underlying ulceration  Tissue decalcification: Cassettes 1, 2, 3, and 5  RB  05/09/2022  Final Diagnosis performed by Quay Burow, MD.   Electronically signed 05/13/2022 3:56:21PM The electronic signature indicates that the named Attending Pathologist has evaluated the specimen Technical component performed at [facility], [address] [place], [place] [address] Lab: [phone] Dir: Rush Farmer, MD, MMM  Professional component performed at [facility], [facility], [place], [place], [place] [address] Lab: [phone] Dir: Kathi Simpers, MD       Assessment & Plan:   Problem List Items Addressed This Visit       Endocrine   Diabetes mellitus associated with hormonal etiology ([place])    Medications were changed about a month ago. Will recheck A1c in 2 months. Call with any concerns. Continue to monitor.       Diabetic foot ulcer with osteomyelitis ([facility])    S/P amputation. Continue to monitor. Call with any concerns.       Relevant Orders   CBC with Differential/Platelet   Basic metabolic panel     Other   Severe sepsis ([place]) - Primary    Resolved.       Amputation of right great toe ([place])    Healing well. Continue to follow with podiatry. Call with any concerns.         Follow up plan: Return in about 2 months (around 07/17/2022).  30 minutes spent with patient today

## 2022-05-16 NOTE — Assessment & Plan Note (Signed)
S/P amputation. Continue to monitor. Call with any concerns.

## 2022-05-16 NOTE — Assessment & Plan Note (Signed)
Resolved

## 2022-05-16 NOTE — Assessment & Plan Note (Signed)
Medications were changed about a month ago. Will recheck A1c in 2 months. Call with any concerns. Continue to monitor.

## 2022-05-16 NOTE — Assessment & Plan Note (Signed)
Healing well. Continue to follow with podiatry. Call with any concerns.

## 2022-05-17 LAB — CBC WITH DIFFERENTIAL/PLATELET
Basophils Absolute: 0.2 10*3/uL (ref 0.0–0.2)
Basos: 2 %
EOS (ABSOLUTE): 0.4 10*3/uL (ref 0.0–0.4)
Eos: 3 %
Hematocrit: 51.1 % — ABNORMAL HIGH (ref 37.5–51.0)
Hemoglobin: 16.7 g/dL (ref 13.0–17.7)
Immature Grans (Abs): 0.6 10*3/uL — ABNORMAL HIGH (ref 0.0–0.1)
Immature Granulocytes: 4 %
Lymphocytes Absolute: 5 10*3/uL — ABNORMAL HIGH (ref 0.7–3.1)
Lymphs: 39 %
MCH: 29.2 pg (ref 26.6–33.0)
MCHC: 32.7 g/dL (ref 31.5–35.7)
MCV: 89 fL (ref 79–97)
Monocytes Absolute: 1.2 10*3/uL — ABNORMAL HIGH (ref 0.1–0.9)
Monocytes: 9 %
Neutrophils Absolute: 5.4 10*3/uL (ref 1.4–7.0)
Neutrophils: 43 %
Platelets: 397 10*3/uL (ref 150–450)
RBC: 5.72 x10E6/uL (ref 4.14–5.80)
RDW: 13.1 % (ref 11.6–15.4)
WBC: 12.8 10*3/uL — ABNORMAL HIGH (ref 3.4–10.8)

## 2022-05-17 LAB — BASIC METABOLIC PANEL
BUN/Creatinine Ratio: 16 (ref 9–20)
BUN: 17 mg/dL (ref 6–24)
CO2: 20 mmol/L (ref 20–29)
Calcium: 9.9 mg/dL (ref 8.7–10.2)
Chloride: 98 mmol/L (ref 96–106)
Creatinine, Ser: 1.04 mg/dL (ref 0.76–1.27)
Glucose: 142 mg/dL — ABNORMAL HIGH (ref 70–99)
Potassium: 4.9 mmol/L (ref 3.5–5.2)
Sodium: 134 mmol/L (ref 134–144)
eGFR: 83 mL/min/{1.73_m2} (ref >59)

## 2022-05-22 NOTE — Addendum Note (Signed)
Addended by: Valerie Roys on: 05/22/2022 08:25 AM   Modules accepted: Level of Service

## 2022-05-27 ENCOUNTER — Inpatient Hospital Stay: Payer: BC Managed Care – PPO | Admitting: Family Medicine

## 2022-05-27 ENCOUNTER — Telehealth: Payer: Self-pay | Admitting: Family Medicine

## 2022-05-27 NOTE — Telephone Encounter (Signed)
Mariam [employer] Case Manager is needing a copy of patients last lab results for Lipid Panel, Urine, Albumin.  Reference # [id number]  Phone  [phone]  Mon-Fri 8am-4pm Central Standard Time Fax: [phone] (please Attn: Mariam)

## 2022-05-27 NOTE — Telephone Encounter (Signed)
Paperwork has been printed and faxed to requested fax information below.

## 2022-06-10 ENCOUNTER — Telehealth: Payer: Self-pay | Admitting: Family Medicine

## 2022-06-10 DIAGNOSIS — L97511 Non-pressure chronic ulcer of other part of right foot limited to breakdown of skin: Secondary | ICD-10-CM | POA: Diagnosis not present

## 2022-06-10 NOTE — Telephone Encounter (Signed)
Copied from [place] [id number]. Topic: General - Inquiry >> Jun 10, 2022  9:45 AM Penni Bombard wrote: Reason for CRM: Pt's wife called asking about paperwork that [place] should have faxed last week for diabetic shoes and insert. Have you recd the paperwork and when can it be sent back to [facility].  CB#  [phone]

## 2022-06-10 NOTE — Telephone Encounter (Signed)
Paperwork received.

## 2022-06-10 NOTE — Telephone Encounter (Signed)
Spoke with [employer] and was informed that they faxed over paperwork on the 11th, but states they will re-fax it for patient to provider.

## 2022-06-12 ENCOUNTER — Other Ambulatory Visit: Payer: Self-pay | Admitting: Family Medicine

## 2022-06-12 NOTE — Telephone Encounter (Signed)
Requested Prescriptions  Pending Prescriptions Disp Refills   metFORMIN (GLUCOPHAGE) 500 MG tablet [Pharmacy Med Name: METFORMIN HCL 500 MG TABLET] 360 tablet 1    Sig: TAKE 2 TABLETS (1,000 MG TOTAL) BY MOUTH 2 (TWO) TIMES DAILY WITH A MEAL.     Endocrinology:  Diabetes - Biguanides Failed - 06/12/2022  1:36 AM      Failed - HBA1C is between 0 and 7.9 and within 180 days    HB A1C (BAYER DCA - WAIVED)  Date Value Ref Range Status  04/11/2022 8.9 (H) 4.8 - 5.6 % Final    Comment:             Prediabetes: 5.7 - 6.4          Diabetes: >6.4          Glycemic control for adults with diabetes: <7.0          Failed - B12 Level in normal range and within 720 days    No results found for: "VITAMINB12"       Passed - Cr in normal range and within 360 days    Creatinine, Ser  Date Value Ref Range Status  05/16/2022 1.04 0.76 - 1.27 mg/dL Final         Passed - eGFR in normal range and within 360 days    GFR calc Af Amer  Date Value Ref Range Status  03/09/2020 104 >59 mL/min/1.73 Final    Comment:    **In accordance with recommendations from the NKF-ASN Task force,**   [facility] is in the process of updating its eGFR calculation to the   2021 CKD-EPI creatinine equation that estimates kidney function   without a race variable.    GFR, Estimated  Date Value Ref Range Status  05/11/2022 >60 >60 mL/min Final    Comment:    (NOTE) Calculated using the CKD-EPI Creatinine Equation (2021) Performed at [facility], [address] [place], [place] [address]    eGFR  Date Value Ref Range Status  05/16/2022 83 >59 mL/min/1.73 Final         Passed - Valid encounter within last 6 months    Recent Outpatient Visits           3 weeks ago Severe sepsis [facility])   [place], Megan P, DO   2 months ago Routine general medical examination at a health care facility   [facility], [place], DO   5 months ago Diabetes mellitus associated  with hormonal etiology [facility])   [place], Megan P, DO   6 months ago Pressure injury of right foot, stage 1   [facility] [place], Erin E, PA-C   9 months ago Primary hypertension   [facility] [place], [place], DO       Future Appointments             In 1 month Johnson, Megan P, DO [place], [facility]            Passed - CBC within normal limits and completed in the last 12 months    WBC  Date Value Ref Range Status  05/16/2022 12.8 (H) 3.4 - 10.8 x10E3/uL Final  05/09/2022 16.1 (H) 4.0 - 10.5 K/uL Final   RBC  Date Value Ref Range Status  05/16/2022 5.72 4.14 - 5.80 x10E6/uL Final  05/09/2022 4.66 4.22 - 5.81 MIL/uL Final   Hemoglobin  Date Value Ref Range Status  05/16/2022 16.7 13.0 - 17.7 g/dL Final   Hematocrit  Date Value Ref Range Status  05/16/2022 51.1 (H) 37.5 - 51.0 % Final   MCHC  Date Value Ref Range Status  05/16/2022 32.7 31.5 - 35.7 g/dL Final  05/09/2022 32.9 30.0 - 36.0 g/dL Final   [facility]  Date Value Ref Range Status  05/16/2022 29.2 26.6 - 33.0 pg Final  05/09/2022 29.6 26.0 - 34.0 pg Final   MCV  Date Value Ref Range Status  05/16/2022 89 79 - 97 fL Final   No results found for: "PLTCOUNTKUC", "LABPLAT", "POCPLA" RDW  Date Value Ref Range Status  05/16/2022 13.1 11.6 - 15.4 % Final

## 2022-06-23 ENCOUNTER — Telehealth: Payer: Self-pay | Admitting: Family Medicine

## 2022-06-23 NOTE — Telephone Encounter (Signed)
Unable to reach caller. Records have been picked up by [employer] representative on 06/19/2022. Please allow 30 business days to receive requested records.

## 2022-06-23 NOTE — Telephone Encounter (Signed)
Quillian Quince from [employer] is calling to see when the patients paper work will be filled out and faxed back to his office. Per Quillian Quince this is for the patients long term disability.  Please advise  Phone Number: [phone]  ext [phone] Fax Number: [phone]

## 2022-06-24 DIAGNOSIS — E1142 Type 2 diabetes mellitus with diabetic polyneuropathy: Secondary | ICD-10-CM | POA: Diagnosis not present

## 2022-06-24 DIAGNOSIS — L97512 Non-pressure chronic ulcer of other part of right foot with fat layer exposed: Secondary | ICD-10-CM | POA: Diagnosis not present

## 2022-06-24 DIAGNOSIS — Z89421 Acquired absence of other right toe(s): Secondary | ICD-10-CM | POA: Diagnosis not present

## 2022-07-08 ENCOUNTER — Encounter: Payer: Self-pay | Admitting: Family Medicine

## 2022-07-08 ENCOUNTER — Ambulatory Visit (INDEPENDENT_AMBULATORY_CARE_PROVIDER_SITE_OTHER): Payer: BC Managed Care – PPO | Admitting: Family Medicine

## 2022-07-08 VITALS — BP 138/80 | HR 79 | Temp 98.3°F | Ht 72.0 in | Wt 315.7 lb

## 2022-07-08 DIAGNOSIS — E1169 Type 2 diabetes mellitus with other specified complication: Secondary | ICD-10-CM | POA: Diagnosis not present

## 2022-07-08 MED ORDER — DAPAGLIFLOZIN PROPANEDIOL 10 MG PO TABS: ORAL_TABLET | ORAL | 1 refills | Status: DC

## 2022-07-08 MED ORDER — METFORMIN HCL ER 500 MG PO TB24: 1000.0000 mg | ORAL_TABLET | Freq: Two times a day (BID) | ORAL | 1 refills | Status: DC

## 2022-07-08 NOTE — Addendum Note (Signed)
Addended by: Valerie Roys on: 07/08/2022 03:58 PM   Modules accepted: Orders

## 2022-07-08 NOTE — Progress Notes (Addendum)
BP 138/80   Pulse 79   Temp 98.3 F (36.8 C) (Oral)   Ht 6' (1.829 m)   Wt (!) 315 lb 11.2 oz (143.2 kg)   SpO2 98%   BMI 42.82 kg/m    Subjective:    Patient ID: Cody Burke, male    DOB: April 23, 1966, 57 y.o.   MRN: [id number]  HPI: Cody Burke is a 57 y.o. male  Chief Complaint  Patient presents with   Diabetes   DIABETES Hypoglycemic episodes:no Polydipsia/polyuria: no Visual disturbance: no Chest pain: no Paresthesias: no Glucose Monitoring: yes  Accucheck frequency: TID  Fasting glucose: 150 Taking Insulin?: no Blood Pressure Monitoring: not checking Retinal Examination: Up to Date Foot Exam: Up to Date Diabetic Education: Completed Pneumovax: Up to Date Influenza: Up to Date Aspirin: yes   Relevant past medical, surgical, family and social history reviewed and updated as indicated. Interim medical history since our last visit reviewed. Allergies and medications reviewed and updated.  Review of Systems  Constitutional: Negative.   Respiratory: Negative.    Cardiovascular: Negative.   Gastrointestinal: Negative.   Musculoskeletal: Negative.   Neurological: Negative.   Psychiatric/Behavioral: Negative.      Per HPI unless specifically indicated above     Objective:    BP 138/80   Pulse 79   Temp 98.3 F (36.8 C) (Oral)   Ht 6' (1.829 m)   Wt (!) 315 lb 11.2 oz (143.2 kg)   SpO2 98%   BMI 42.82 kg/m   Wt Readings from Last 3 Encounters:  07/08/22 (!) 315 lb 11.2 oz (143.2 kg)  05/16/22 (!) 320 lb (145.2 kg)  05/08/22 (!) 320 lb (145.2 kg)    Physical Exam Vitals and nursing note reviewed.  Constitutional:      General: He is not in acute distress.    Appearance: Normal appearance. He is obese. He is not ill-appearing, toxic-appearing or diaphoretic.  HENT:     Head: Normocephalic and atraumatic.     Right Ear: External ear normal.     Left Ear: External ear normal.     Nose: Nose normal.     Mouth/Throat:     Mouth: Mucous membranes  are moist.     Pharynx: Oropharynx is clear.  Eyes:     General: No scleral icterus.       Right eye: No discharge.        Left eye: No discharge.     Extraocular Movements: Extraocular movements intact.     Conjunctiva/sclera: Conjunctivae normal.     Pupils: Pupils are equal, round, and reactive to light.  Cardiovascular:     Rate and Rhythm: Normal rate and regular rhythm.     Pulses: Normal pulses.     Heart sounds: Normal heart sounds. No murmur heard.    No friction rub. No gallop.  Pulmonary:     Effort: Pulmonary effort is normal. No respiratory distress.     Breath sounds: Normal breath sounds. No stridor. No wheezing, rhonchi or rales.  Chest:     Chest wall: No tenderness.  Musculoskeletal:        General: Normal range of motion.     Cervical back: Normal range of motion and neck supple.  Skin:    General: Skin is warm and dry.     Capillary Refill: Capillary refill takes less than 2 seconds.     Coloration: Skin is not jaundiced or pale.     Findings: No bruising, erythema, lesion or  rash.  Neurological:     General: No focal deficit present.     Mental Status: He is alert and oriented to person, place, and time. Mental status is at baseline.  Psychiatric:        Mood and Affect: Mood normal.        Behavior: Behavior normal.        Thought Content: Thought content normal.        Judgment: Judgment normal.     Results for orders placed or performed in visit on 05/16/22  CBC with Differential/Platelet  Result Value Ref Range   WBC 12.8 (H) 3.4 - 10.8 x10E3/uL   RBC 5.72 4.14 - 5.80 x10E6/uL   Hemoglobin 16.7 13.0 - 17.7 g/dL   Hematocrit 51.1 (H) 37.5 - 51.0 %   MCV 89 79 - 97 fL   MCH 29.2 26.6 - 33.0 pg   MCHC 32.7 31.5 - 35.7 g/dL   RDW 13.1 11.6 - 15.4 %   Platelets 397 150 - 450 x10E3/uL   Neutrophils 43 Not Estab. %   Lymphs 39 Not Estab. %   Monocytes 9 Not Estab. %   Eos 3 Not Estab. %   Basos 2 Not Estab. %   Neutrophils Absolute 5.4 1.4 -  7.0 x10E3/uL   Lymphocytes Absolute 5.0 (H) 0.7 - 3.1 x10E3/uL   Monocytes Absolute 1.2 (H) 0.1 - 0.9 x10E3/uL   EOS (ABSOLUTE) 0.4 0.0 - 0.4 x10E3/uL   Basophils Absolute 0.2 0.0 - 0.2 x10E3/uL   Immature Granulocytes 4 Not Estab. %   Immature Grans (Abs) 0.6 (H) 0.0 - 0.1 [id number]  Basic metabolic panel  Result Value Ref Range   Glucose 142 (H) 70 - 99 mg/dL   BUN 17 6 - 24 mg/dL   Creatinine, Ser 1.04 0.76 - 1.27 mg/dL   eGFR 83 >59 mL/min/1.73   BUN/Creatinine Ratio 16 9 - 20   Sodium 134 134 - 144 mmol/L   Potassium 4.9 3.5 - 5.2 mmol/L   Chloride 98 96 - 106 mmol/L   CO2 20 20 - 29 mmol/L   Calcium 9.9 8.7 - 10.2 mg/dL      Assessment & Plan:   Problem List Items Addressed This Visit       Endocrine   Diabetes mellitus associated with hormonal etiology ([place]) - Primary    Doing well. Will recheck A1c today. Await results. Treat as needed.       Relevant Medications   metFORMIN (GLUCOPHAGE-XR) 500 MG 24 hr tablet   dapagliflozin propanediol (FARXIGA) 10 MG TABS tablet   Other Relevant Orders   Hgb A1c w/o eAG     Follow up plan: Return in about 3 months (around 10/06/2022).

## 2022-07-08 NOTE — Assessment & Plan Note (Signed)
Doing well. Will recheck A1c today. Await results. Treat as needed.

## 2022-07-09 LAB — HGB A1C W/O EAG: Hgb A1c MFr Bld: 7.7 % — ABNORMAL HIGH (ref 4.8–5.6)

## 2022-07-11 ENCOUNTER — Telehealth: Payer: Self-pay

## 2022-07-11 NOTE — Telephone Encounter (Signed)
Copied from [place] [phone]. Topic: General - Other >> Jul 11, 2022 11:46 AM Everette C wrote: Reason for CRM: Lavanya with the patient's pharmacy has called for clarity and confirmation of their recent prescription for metFORMIN (GLUCOPHAGE-XR) 500 MG 24 hr tablet [id number]  Please contact the patient's pharmacy further when possible

## 2022-07-11 NOTE — Telephone Encounter (Signed)
Spoke with [facility] Pharmacist to clarify patient's new prescription for Metformin that was sent to the local pharmacy on 07/08/22. Pharmacist verbalized understanding and has no further questions.

## 2022-07-14 ENCOUNTER — Telehealth: Payer: Self-pay | Admitting: Family Medicine

## 2022-07-14 ENCOUNTER — Ambulatory Visit: Payer: BC Managed Care – PPO | Admitting: Family Medicine

## 2022-07-14 NOTE — Telephone Encounter (Signed)
Patient is calling because there is confusion with his Metformin. Patient says he reached out to the Pharmacy last week and was told they needed clarification from the doctor. Informed patient someone had spoken with the pharmacist and they should be aware of which Metformin to fill. Patient says he reached out to the Pharmacy today and they said they're still waiting on clarification. Patient wants to know which Metformin he is supposed to be taking because the one that's getting filled isn't the one he's been taking. Patient would like someone to call and follow up with him on which medication he needs to be taking.

## 2022-07-14 NOTE — Telephone Encounter (Signed)
Spoke with patient's local pharmacy to clarify the status of his prescription Metformin. Pharmacist clarify patient's prescription has been ready for pick up since Friday. Patient was notified and verbalized understanding. Advised patient if he has any trouble regarding his prescription when he goes to pick it up to give our office a call back.

## 2022-07-23 ENCOUNTER — Telehealth: Payer: Self-pay

## 2022-07-23 NOTE — Telephone Encounter (Signed)
Copied from [place] [id number]. Topic: General - Other >> Jul 23, 2022 12:01 PM Dominique A wrote: Reason for CRM: Pt states that his long term disability was denied and he is needing to speak with his PCP to talk about why his disability was denied. Pt is having to do an appeal and is needing to speak with PCP before then.  Pt states that for his call back he get off work at [employer], he is off all day Friday, next week pt will get off at 1pm, it takes 30mn for him to get home, pt does not want to miss PCP phone call.  Please have PCP call pt.

## 2022-07-25 NOTE — Telephone Encounter (Signed)
Spoke with patient and wife and made them aware that we do not handle about Long Term Disability here in our office. Patient is asking if Dr Wynetta Emery can write him a letter stating that his Diabetics being out of control were not the reason his sore. Patient was advised I would take all the information down and make Dr Wynetta Emery aware of everything and give them a call back once she responses. Patient verbalized understanding.

## 2022-07-30 NOTE — Telephone Encounter (Signed)
I can send a letter with his A1c, but given that his A1c was greater than 7, I cannot say that it didn't contribute to his ulcer even if it wasn't the primary cause.

## 2022-07-30 NOTE — Telephone Encounter (Signed)
Spoke with patient and is he is asking if Dr Wynetta Emery could possibly include in the requested letter that patient does not have a diagnosed of "uncontrolled diabetes." Patient says he did not have any issues with his current A1c results until his insurance stopped covering [place] and switched him to [place]. Patient states he has not been under 7 with A1c results and was wondering if Dr Wynetta Emery can write a letter to help with appeal process. Please advise?

## 2022-08-20 DIAGNOSIS — E1142 Type 2 diabetes mellitus with diabetic polyneuropathy: Secondary | ICD-10-CM | POA: Diagnosis not present

## 2022-08-20 DIAGNOSIS — L603 Nail dystrophy: Secondary | ICD-10-CM | POA: Diagnosis not present

## 2022-08-20 DIAGNOSIS — I739 Peripheral vascular disease, unspecified: Secondary | ICD-10-CM | POA: Diagnosis not present

## 2022-08-20 DIAGNOSIS — S99921A Unspecified injury of right foot, initial encounter: Secondary | ICD-10-CM | POA: Diagnosis not present

## 2022-08-20 DIAGNOSIS — M2041 Other hammer toe(s) (acquired), right foot: Secondary | ICD-10-CM | POA: Diagnosis not present

## 2022-10-07 ENCOUNTER — Ambulatory Visit: Payer: BC Managed Care – PPO | Admitting: Family Medicine

## 2022-10-07 ENCOUNTER — Encounter: Payer: Self-pay | Admitting: Family Medicine

## 2022-10-07 VITALS — BP 129/81 | HR 76 | Temp 98.6°F | Wt 305.8 lb

## 2022-10-07 DIAGNOSIS — I10 Essential (primary) hypertension: Secondary | ICD-10-CM | POA: Diagnosis not present

## 2022-10-07 DIAGNOSIS — E1169 Type 2 diabetes mellitus with other specified complication: Secondary | ICD-10-CM | POA: Diagnosis not present

## 2022-10-07 DIAGNOSIS — D582 Other hemoglobinopathies: Secondary | ICD-10-CM

## 2022-10-07 DIAGNOSIS — E7800 Pure hypercholesterolemia, unspecified: Secondary | ICD-10-CM | POA: Diagnosis not present

## 2022-10-07 LAB — BAYER DCA HB A1C WAIVED: HB A1C (BAYER DCA - WAIVED): 7.3 % — ABNORMAL HIGH (ref 4.8–5.6)

## 2022-10-07 MED ORDER — DAPAGLIFLOZIN PROPANEDIOL 10 MG PO TABS: ORAL_TABLET | ORAL | 1 refills | Status: DC

## 2022-10-07 MED ORDER — ATORVASTATIN CALCIUM 20 MG PO TABS: 20.0000 mg | ORAL_TABLET | Freq: Every day | ORAL | 1 refills | Status: DC

## 2022-10-07 MED ORDER — METFORMIN HCL ER 500 MG PO TB24: 1000.0000 mg | ORAL_TABLET | Freq: Two times a day (BID) | ORAL | 1 refills | Status: DC

## 2022-10-07 MED ORDER — BENAZEPRIL HCL 40 MG PO TABS: 40.0000 mg | ORAL_TABLET | Freq: Every day | ORAL | 1 refills | Status: DC

## 2022-10-07 NOTE — Assessment & Plan Note (Signed)
Rechecking labs today. Await results. Treat as needed.  °

## 2022-10-07 NOTE — Assessment & Plan Note (Signed)
Under good control on current regimen. Continue current regimen. Continue to monitor. Call with any concerns. Refills given. Labs drawn today.   

## 2022-10-07 NOTE — Assessment & Plan Note (Addendum)
Improved with A1c of 7.3 down from 7.7. Continue current regimen. Continue to monitor. Recheck 3 months. Call with any concerns.

## 2022-10-07 NOTE — Progress Notes (Signed)
BP 129/81   Pulse 76   Temp 98.6 F (37 C) (Oral)   Wt (!) 305 lb 12.8 oz (138.7 kg)   SpO2 95%   BMI 41.47 kg/m    Subjective:    Patient ID: Cody Burke, male    DOB: 10-28-1965, 57 y.o.   MRN: [id number]  HPI: Cody Burke is a 56 y.o. male  Chief Complaint  Patient presents with   Diabetes   DIABETES Hypoglycemic episodes:no Polydipsia/polyuria: no Visual disturbance: no Chest pain: no Paresthesias: no Glucose Monitoring: yes  Accucheck frequency: Daily  Fasting glucose: average 130 Taking Insulin?: no Blood Pressure Monitoring: not checking Retinal Examination: Up to Date Foot Exam: Up to Date Diabetic Education: Completed Pneumovax: Up to Date Influenza: Up to Date Aspirin: yes  HYPERTENSION / HYPERLIPIDEMIA Satisfied with current treatment? yes Duration of hypertension: chronic BP monitoring frequency: not checking BP medication side effects: no Past BP meds: beanzepril Duration of hyperlipidemia: chronic Cholesterol medication side effects: no Cholesterol supplements: fish oil Past cholesterol medications: atorvastatin Medication compliance: excellent compliance Aspirin: yes Recent stressors: no Recurrent headaches: no Visual changes: no Palpitations: no Dyspnea: no Chest pain: no Lower extremity edema: no Dizzy/lightheaded: no  Relevant past medical, surgical, family and social history reviewed and updated as indicated. Interim medical history since our last visit reviewed. Allergies and medications reviewed and updated.  Review of Systems  Constitutional: Negative.   Respiratory: Negative.    Cardiovascular: Negative.   Gastrointestinal: Negative.   Musculoskeletal: Negative.   Neurological: Negative.   Psychiatric/Behavioral: Negative.      Per HPI unless specifically indicated above     Objective:    BP 129/81   Pulse 76   Temp 98.6 F (37 C) (Oral)   Wt (!) 305 lb 12.8 oz (138.7 kg)   SpO2 95%   BMI 41.47 kg/m   Wt  Readings from Last 3 Encounters:  10/07/22 (!) 305 lb 12.8 oz (138.7 kg)  07/08/22 (!) 315 lb 11.2 oz (143.2 kg)  05/16/22 (!) 320 lb (145.2 kg)    Physical Exam Vitals and nursing note reviewed.  Constitutional:      General: He is not in acute distress.    Appearance: Normal appearance. He is not ill-appearing, toxic-appearing or diaphoretic.  HENT:     Head: Normocephalic and atraumatic.     Right Ear: External ear normal.     Left Ear: External ear normal.     Nose: Nose normal.     Mouth/Throat:     Mouth: Mucous membranes are moist.     Pharynx: Oropharynx is clear.  Eyes:     General: No scleral icterus.       Right eye: No discharge.        Left eye: No discharge.     Extraocular Movements: Extraocular movements intact.     Conjunctiva/sclera: Conjunctivae normal.     Pupils: Pupils are equal, round, and reactive to light.  Cardiovascular:     Rate and Rhythm: Normal rate and regular rhythm.     Pulses: Normal pulses.     Heart sounds: Normal heart sounds. No murmur heard.    No friction rub. No gallop.  Pulmonary:     Effort: Pulmonary effort is normal. No respiratory distress.     Breath sounds: Normal breath sounds. No stridor. No wheezing, rhonchi or rales.  Chest:     Chest wall: No tenderness.  Musculoskeletal:        General: Normal range of  motion.     Cervical back: Normal range of motion and neck supple.  Skin:    General: Skin is warm and dry.     Capillary Refill: Capillary refill takes less than 2 seconds.     Coloration: Skin is not jaundiced or pale.     Findings: No bruising, erythema, lesion or rash.  Neurological:     General: No focal deficit present.     Mental Status: He is alert and oriented to person, place, and time. Mental status is at baseline.  Psychiatric:        Mood and Affect: Mood normal.        Behavior: Behavior normal.        Thought Content: Thought content normal.        Judgment: Judgment normal.     Results for  orders placed or performed in visit on 07/08/22  Hgb A1c w/o eAG  Result Value Ref Range   Hgb A1c MFr Bld 7.7 (H) 4.8 - 5.6 %      Assessment & Plan:   Problem List Items Addressed This Visit       Cardiovascular and Mediastinum   Hypertension    Under good control on current regimen. Continue current regimen. Continue to monitor. Call with any concerns. Refills given. Labs drawn today.        Relevant Medications   atorvastatin (LIPITOR) 20 MG tablet   benazepril (LOTENSIN) 40 MG tablet   Other Relevant Orders   Lipid Panel w/o Chol/HDL Ratio   Comprehensive metabolic panel     Endocrine   Diabetes mellitus associated with hormonal etiology ([facility]) - Primary    Improved with A1c of down from 7.7. Continue current regimen. Continue to monitor. Recheck 6 months. Call with any concerns.       Relevant Medications   atorvastatin (LIPITOR) 20 MG tablet   benazepril (LOTENSIN) 40 MG tablet   dapagliflozin propanediol (FARXIGA) 10 MG TABS tablet   metFORMIN (GLUCOPHAGE-XR) 500 MG 24 hr tablet   Other Relevant Orders   Bayer DCA Hb A1c Waived   Lipid Panel w/o Chol/HDL Ratio   Comprehensive metabolic panel     Other   Hyperlipidemia    Under good control on current regimen. Continue current regimen. Continue to monitor. Call with any concerns. Refills given. Labs drawn today.       Relevant Medications   atorvastatin (LIPITOR) 20 MG tablet   benazepril (LOTENSIN) 40 MG tablet   Other Relevant Orders   Lipid Panel w/o Chol/HDL Ratio   Comprehensive metabolic panel   Elevated hemoglobin ([facility])    Rechecking labs today. Await results. Treat as needed.       Relevant Orders   CBC with Differential/Platelet   Lipid Panel w/o Chol/HDL Ratio   Comprehensive metabolic panel     Follow up plan: Return in about 3 months (around 01/07/2023).

## 2022-10-08 LAB — LIPID PANEL W/O CHOL/HDL RATIO
Cholesterol, Total: 133 mg/dL (ref 100–199)
HDL: 42 mg/dL (ref >39)
LDL Chol Calc (NIH): 57 mg/dL (ref 0–99)
Triglycerides: 208 mg/dL — ABNORMAL HIGH (ref 0–149)
VLDL Cholesterol Cal: 34 mg/dL (ref 5–40)

## 2022-10-08 LAB — COMPREHENSIVE METABOLIC PANEL
ALT: 31 IU/L (ref 0–44)
AST: 27 IU/L (ref 0–40)
Albumin/Globulin Ratio: 1.6 (ref 1.2–2.2)
Albumin: 4.3 g/dL (ref 3.8–4.9)
Alkaline Phosphatase: 106 IU/L (ref 44–121)
BUN/Creatinine Ratio: 19 (ref 9–20)
BUN: 19 mg/dL (ref 6–24)
Bilirubin Total: 0.4 mg/dL (ref 0.0–1.2)
CO2: 21 mmol/L (ref 20–29)
Calcium: 9.3 mg/dL (ref 8.7–10.2)
Chloride: 100 mmol/L (ref 96–106)
Creatinine, Ser: 0.98 mg/dL (ref 0.76–1.27)
Globulin, Total: 2.7 g/dL (ref 1.5–4.5)
Glucose: 113 mg/dL — ABNORMAL HIGH (ref 70–99)
Potassium: 4.4 mmol/L (ref 3.5–5.2)
Sodium: 137 mmol/L (ref 134–144)
Total Protein: 7 g/dL (ref 6.0–8.5)
eGFR: 89 mL/min/{1.73_m2} (ref >59)

## 2022-10-08 LAB — CBC WITH DIFFERENTIAL/PLATELET
Basophils Absolute: 0.1 10*3/uL (ref 0.0–0.2)
Basos: 1 %
EOS (ABSOLUTE): 0.4 10*3/uL (ref 0.0–0.4)
Eos: 3 %
Hematocrit: 49.2 % (ref 37.5–51.0)
Hemoglobin: 16.2 g/dL (ref 13.0–17.7)
Immature Grans (Abs): 0.1 10*3/uL (ref 0.0–0.1)
Immature Granulocytes: 1 %
Lymphocytes Absolute: 4.8 10*3/uL — ABNORMAL HIGH (ref 0.7–3.1)
Lymphs: 42 %
MCH: 29.6 pg (ref 26.6–33.0)
MCHC: 32.9 g/dL (ref 31.5–35.7)
MCV: 90 fL (ref 79–97)
Monocytes Absolute: 1.1 10*3/uL — ABNORMAL HIGH (ref 0.1–0.9)
Monocytes: 9 %
Neutrophils Absolute: 5.1 10*3/uL (ref 1.4–7.0)
Neutrophils: 44 %
Platelets: 245 10*3/uL (ref 150–450)
RBC: 5.48 x10E6/uL (ref 4.14–5.80)
RDW: 13.1 % (ref 11.6–15.4)
WBC: 11.5 10*3/uL — ABNORMAL HIGH (ref 3.4–10.8)

## 2022-11-19 DIAGNOSIS — S99921D Unspecified injury of right foot, subsequent encounter: Secondary | ICD-10-CM | POA: Diagnosis not present

## 2022-11-19 DIAGNOSIS — I739 Peripheral vascular disease, unspecified: Secondary | ICD-10-CM | POA: Diagnosis not present

## 2022-11-19 DIAGNOSIS — L97511 Non-pressure chronic ulcer of other part of right foot limited to breakdown of skin: Secondary | ICD-10-CM | POA: Diagnosis not present

## 2022-11-19 DIAGNOSIS — L603 Nail dystrophy: Secondary | ICD-10-CM | POA: Diagnosis not present

## 2022-11-19 DIAGNOSIS — E1142 Type 2 diabetes mellitus with diabetic polyneuropathy: Secondary | ICD-10-CM | POA: Diagnosis not present

## 2022-11-19 DIAGNOSIS — Z89411 Acquired absence of right great toe: Secondary | ICD-10-CM | POA: Diagnosis not present

## 2022-11-26 DIAGNOSIS — E1142 Type 2 diabetes mellitus with diabetic polyneuropathy: Secondary | ICD-10-CM | POA: Diagnosis not present

## 2022-11-26 DIAGNOSIS — L84 Corns and callosities: Secondary | ICD-10-CM | POA: Diagnosis not present

## 2022-12-04 DIAGNOSIS — E1142 Type 2 diabetes mellitus with diabetic polyneuropathy: Secondary | ICD-10-CM | POA: Diagnosis not present

## 2022-12-04 DIAGNOSIS — M2041 Other hammer toe(s) (acquired), right foot: Secondary | ICD-10-CM | POA: Diagnosis not present

## 2022-12-15 DIAGNOSIS — L84 Corns and callosities: Secondary | ICD-10-CM | POA: Diagnosis not present

## 2022-12-15 DIAGNOSIS — E1142 Type 2 diabetes mellitus with diabetic polyneuropathy: Secondary | ICD-10-CM | POA: Diagnosis not present

## 2022-12-15 DIAGNOSIS — L851 Acquired keratosis [keratoderma] palmaris et plantaris: Secondary | ICD-10-CM | POA: Diagnosis not present

## 2023-01-09 ENCOUNTER — Ambulatory Visit: Payer: BC Managed Care – PPO | Admitting: Family Medicine

## 2023-01-09 ENCOUNTER — Encounter: Payer: Self-pay | Admitting: Family Medicine

## 2023-01-09 VITALS — BP 126/72 | HR 64 | Temp 97.7°F | Wt 305.4 lb

## 2023-01-09 DIAGNOSIS — Z7984 Long term (current) use of oral hypoglycemic drugs: Secondary | ICD-10-CM | POA: Diagnosis not present

## 2023-01-09 DIAGNOSIS — R3915 Urgency of urination: Secondary | ICD-10-CM

## 2023-01-09 DIAGNOSIS — E1169 Type 2 diabetes mellitus with other specified complication: Secondary | ICD-10-CM | POA: Diagnosis not present

## 2023-01-09 LAB — BAYER DCA HB A1C WAIVED: HB A1C (BAYER DCA - WAIVED): 6.8 % — ABNORMAL HIGH (ref 4.8–5.6)

## 2023-01-09 NOTE — Progress Notes (Signed)
BP 126/72   Pulse 64   Temp 97.7 F (36.5 C) (Oral)   Wt (!) 305 lb 6.4 oz (138.5 kg)   SpO2 99%   BMI 41.42 kg/m    Subjective:    Patient ID: Cody Burke, male    DOB: 01/10/1966, 57 y.o.   MRN: [id number]  HPI: Cody Burke is a 57 y.o. male  Chief Complaint  Patient presents with   Diabetes   DIABETES Hypoglycemic episodes:no Polydipsia/polyuria: no Visual disturbance: no Chest pain: no Paresthesias: yes Glucose Monitoring: yes  Accucheck frequency: Daily Taking Insulin?: no Blood Pressure Monitoring: not checking Retinal Examination: Up to Date Foot Exam: Up to Date Diabetic Education: Completed Pneumovax: Up to Date Influenza: Not up to Date Aspirin: no  Relevant past medical, surgical, family and social history reviewed and updated as indicated. Interim medical history since our last visit reviewed. Allergies and medications reviewed and updated.  Review of Systems  Constitutional: Negative.   Respiratory: Negative.    Cardiovascular: Negative.   Gastrointestinal: Negative.   Musculoskeletal: Negative.   Neurological: Negative.   Psychiatric/Behavioral: Negative.      Per HPI unless specifically indicated above     Objective:    BP 126/72   Pulse 64   Temp 97.7 F (36.5 C) (Oral)   Wt (!) 305 lb 6.4 oz (138.5 kg)   SpO2 99%   BMI 41.42 kg/m   Wt Readings from Last 3 Encounters:  01/09/23 (!) 305 lb 6.4 oz (138.5 kg)  10/07/22 (!) 305 lb 12.8 oz (138.7 kg)  07/08/22 (!) 315 lb 11.2 oz (143.2 kg)    Physical Exam Vitals and nursing note reviewed.  Constitutional:      General: He is not in acute distress.    Appearance: Normal appearance. He is obese. He is not ill-appearing, toxic-appearing or diaphoretic.  HENT:     Head: Normocephalic and atraumatic.     Right Ear: External ear normal.     Left Ear: External ear normal.     Nose: Nose normal.     Mouth/Throat:     Mouth: Mucous membranes are moist.     Pharynx: Oropharynx is  clear.  Eyes:     General: No scleral icterus.       Right eye: No discharge.        Left eye: No discharge.     Extraocular Movements: Extraocular movements intact.     Conjunctiva/sclera: Conjunctivae normal.     Pupils: Pupils are equal, round, and reactive to light.  Cardiovascular:     Rate and Rhythm: Normal rate and regular rhythm.     Pulses: Normal pulses.     Heart sounds: Normal heart sounds. No murmur heard.    No friction rub. No gallop.  Pulmonary:     Effort: Pulmonary effort is normal. No respiratory distress.     Breath sounds: Normal breath sounds. No stridor. No wheezing, rhonchi or rales.  Chest:     Chest wall: No tenderness.  Musculoskeletal:        General: Normal range of motion.     Cervical back: Normal range of motion and neck supple.  Skin:    General: Skin is warm and dry.     Capillary Refill: Capillary refill takes less than 2 seconds.     Coloration: Skin is not jaundiced or pale.     Findings: No bruising, erythema, lesion or rash.  Neurological:     General: No focal deficit present.  Mental Status: He is alert and oriented to person, place, and time. Mental status is at baseline.  Psychiatric:        Mood and Affect: Mood normal.        Behavior: Behavior normal.        Thought Content: Thought content normal.        Judgment: Judgment normal.     Results for orders placed or performed in visit on 10/07/22  CBC with Differential/Platelet  Result Value Ref Range   WBC 11.5 (H) 3.4 - 10.8 x10E3/uL   RBC 5.48 4.14 - 5.80 x10E6/uL   Hemoglobin 16.2 13.0 - 17.7 g/dL   Hematocrit 16.1 09.6 - 51.0 %   MCV 90 79 - 97 fL   MCH 29.6 26.6 - 33.0 pg   MCHC 32.9 31.5 - 35.7 g/dL   RDW 04.5 40.9 - 81.1 %   Platelets 245 150 - 450 x10E3/uL   Neutrophils 44 Not Estab. %   Lymphs 42 Not Estab. %   Monocytes 9 Not Estab. %   Eos 3 Not Estab. %   Basos 1 Not Estab. %   Neutrophils Absolute 5.1 1.4 - 7.0 x10E3/uL   Lymphocytes Absolute 4.8 (H)  0.7 - 3.1 x10E3/uL   Monocytes Absolute 1.1 (H) 0.1 - 0.9 x10E3/uL   EOS (ABSOLUTE) 0.4 0.0 - 0.4 x10E3/uL   Basophils Absolute 0.1 0.0 - 0.2 x10E3/uL   Immature Granulocytes 1 Not Estab. %   Immature Grans (Abs) 0.1 0.0 - 0.1 x10E3/uL  Bayer DCA Hb A1c Waived  Result Value Ref Range   HB A1C (BAYER DCA - WAIVED) 7.3 (H) 4.8 - 5.6 %  Lipid Panel w/o Chol/HDL Ratio  Result Value Ref Range   Cholesterol, Total 133 100 - 199 mg/dL   Triglycerides 914 (H) 0 - 149 mg/dL   HDL 42 >78 mg/dL   VLDL Cholesterol Cal 34 5 - 40 mg/dL   LDL Chol Calc (NIH) 57 0 - 99 mg/dL  Comprehensive metabolic panel  Result Value Ref Range   Glucose 113 (H) 70 - 99 mg/dL   BUN 19 6 - 24 mg/dL   Creatinine, Ser 2.95 0.76 - 1.27 mg/dL   eGFR 89 >62 ZH/YQM/5.78   BUN/Creatinine Ratio 19 9 - 20   Sodium 137 134 - 144 mmol/L   Potassium 4.4 3.5 - 5.2 mmol/L   Chloride 100 96 - 106 mmol/L   CO2 21 20 - 29 mmol/L   Calcium 9.3 8.7 - 10.2 mg/dL   Total Protein 7.0 6.0 - 8.5 g/dL   Albumin 4.3 3.8 - 4.9 g/dL   Globulin, Total 2.7 1.5 - 4.5 g/dL   Albumin/Globulin Ratio 1.6 1.2 - 2.2   Bilirubin Total 0.4 0.0 - 1.2 mg/dL   Alkaline Phosphatase 106 44 - 121 IU/L   AST 27 0 - 40 IU/L   ALT 31 0 - 44 IU/L      Assessment & Plan:   Problem List Items Addressed This Visit       Endocrine   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 Continue current regimen. Call with any concerns.       Relevant Orders   Bayer DCA Hb A1c Waived     Other   Morbid obesity (HCC)    Encouraged diet and exercise with goal of losing 1-2lbs per week.      Other Visit Diagnoses  Urgency of urination       Referral to urology placed today at patient request. Call with any concerns.   Relevant Orders   Ambulatory referral to Urology        Follow up plan: Return in about 3 months (around 04/11/2023) for physical.

## 2023-01-09 NOTE — Assessment & Plan Note (Signed)
Encouraged diet and exercise with goal of losing 1-2lbs per week.  

## 2023-01-09 NOTE — Assessment & Plan Note (Signed)
Doing great with A1c of 6.8 down from 7.3. Continue current regimen. Call with any concerns.

## 2023-01-30 DIAGNOSIS — L84 Corns and callosities: Secondary | ICD-10-CM | POA: Diagnosis not present

## 2023-01-30 DIAGNOSIS — E1142 Type 2 diabetes mellitus with diabetic polyneuropathy: Secondary | ICD-10-CM | POA: Diagnosis not present

## 2023-01-30 DIAGNOSIS — M2041 Other hammer toe(s) (acquired), right foot: Secondary | ICD-10-CM | POA: Diagnosis not present

## 2023-01-30 DIAGNOSIS — L97521 Non-pressure chronic ulcer of other part of left foot limited to breakdown of skin: Secondary | ICD-10-CM | POA: Diagnosis not present

## 2023-01-30 DIAGNOSIS — L97511 Non-pressure chronic ulcer of other part of right foot limited to breakdown of skin: Secondary | ICD-10-CM | POA: Diagnosis not present

## 2023-01-30 DIAGNOSIS — M216X1 Other acquired deformities of right foot: Secondary | ICD-10-CM | POA: Diagnosis not present

## 2023-02-11 DIAGNOSIS — L97511 Non-pressure chronic ulcer of other part of right foot limited to breakdown of skin: Secondary | ICD-10-CM | POA: Diagnosis not present

## 2023-02-11 DIAGNOSIS — L84 Corns and callosities: Secondary | ICD-10-CM | POA: Diagnosis not present

## 2023-02-11 DIAGNOSIS — L603 Nail dystrophy: Secondary | ICD-10-CM | POA: Diagnosis not present

## 2023-02-11 DIAGNOSIS — E1142 Type 2 diabetes mellitus with diabetic polyneuropathy: Secondary | ICD-10-CM | POA: Diagnosis not present

## 2023-02-11 DIAGNOSIS — L851 Acquired keratosis [keratoderma] palmaris et plantaris: Secondary | ICD-10-CM | POA: Diagnosis not present

## 2023-02-18 DIAGNOSIS — L97511 Non-pressure chronic ulcer of other part of right foot limited to breakdown of skin: Secondary | ICD-10-CM | POA: Diagnosis not present

## 2023-02-18 DIAGNOSIS — Z89411 Acquired absence of right great toe: Secondary | ICD-10-CM | POA: Diagnosis not present

## 2023-02-18 DIAGNOSIS — L97521 Non-pressure chronic ulcer of other part of left foot limited to breakdown of skin: Secondary | ICD-10-CM | POA: Diagnosis not present

## 2023-02-27 ENCOUNTER — Ambulatory Visit: Payer: BC Managed Care – PPO | Admitting: Urology

## 2023-02-27 ENCOUNTER — Encounter: Payer: Self-pay | Admitting: Urology

## 2023-02-27 VITALS — BP 163/90 | HR 90 | Ht 72.0 in | Wt 310.0 lb

## 2023-02-27 DIAGNOSIS — N401 Enlarged prostate with lower urinary tract symptoms: Secondary | ICD-10-CM

## 2023-02-27 DIAGNOSIS — R350 Frequency of micturition: Secondary | ICD-10-CM

## 2023-02-27 DIAGNOSIS — N529 Male erectile dysfunction, unspecified: Secondary | ICD-10-CM | POA: Diagnosis not present

## 2023-02-27 LAB — URINALYSIS, COMPLETE
Bilirubin, UA: NEGATIVE
Ketones, UA: NEGATIVE
Leukocytes,UA: NEGATIVE
Nitrite, UA: NEGATIVE
Protein,UA: NEGATIVE
RBC, UA: NEGATIVE
Specific Gravity, UA: <1.005 — ABNORMAL LOW (ref 1.005–1.030)
Urobilinogen, Ur: 0.2 mg/dL (ref 0.2–1.0)
pH, UA: 5.5 (ref 5.0–7.5)

## 2023-02-27 LAB — MICROSCOPIC EXAMINATION
Bacteria, UA: NONE SEEN
WBC, UA: NONE SEEN /[HPF] (ref 0–5)

## 2023-02-27 LAB — BLADDER SCAN AMB NON-IMAGING: Scan Result: 121

## 2023-02-27 MED ORDER — SILDENAFIL CITRATE 100 MG PO TABS: ORAL_TABLET | ORAL | 0 refills | Status: DC

## 2023-02-27 MED ORDER — SILODOSIN 8 MG PO CAPS: 8.0000 mg | ORAL_CAPSULE | Freq: Every day | ORAL | 0 refills | Status: DC

## 2023-02-27 NOTE — Progress Notes (Signed)
I, Cody Burke, acting as a scribe for Cody Altes, MD., have documented all relevant documentation on the behalf of Cody Altes, MD, as directed by Cody Altes, MD while in the presence of Cody Altes, MD.  02/27/2023 2:38 PM   Cody Burke [birth year] [id number]  Referring provider: Dorcas Carrow, DO [address] [place],  [place] [address]  Chief Complaint  Patient presents with   Benign Prostatic Hypertrophy    HPI: Cody Burke is a 57 y.o. male referred for evaluation of a lower urinary tract symptoms.  2 year history of bothersome lower urinary tract symptoms with his most bothersome symptoms being urinary frequency and urgency. He has occasional episodes of urge incontinence. He has nocturia x2 and an intermittent stream at times.  IPSS today was 13/35 with a bother score rated 4/6.  Denies dysuria, gross hematuria.  No flank, abdominal, or pelvic pain.  States he has previously been told his prostate is enlarged. He states his PCP several years ago started him on tamsulosin, then took him off the medication because his "PSA was too low". From what he can remember, he does not feel the tamsulosin was effective.  He also complains of difficulty achieving and maintaining an erection. Significant organic risk factors, including diabetes, hypertension, antihypertensive medications, and hyperlipidemia.  Has taken tadalafil in the past, which was not effective.    PMH: Past Medical History:  Diagnosis Date   Diabetes mellitus without complication (HCC)    Hyperlipidemia    Hypertension    Obesity    Peripheral vascular disease (HCC)    Sleep apnea    severe - new DX - has not gotten CPAP yet    Surgical History: Past Surgical History:  Procedure Laterality Date   AMPUTATION Right 05/09/2022   Procedure: AMPUTATION FIRST RAY;  Surgeon: Rosetta Posner, DPM;  Location: [facility];  Service: Podiatry;  Laterality: Right;   ARTHRODESIS METATARSALPHALANGEAL JOINT  (MTPJ) Right 03/07/2022   Procedure: 19147 - Richarda Blade;  Surgeon: Gwyneth Revels, DPM;  Location: [facility];  Service: Podiatry;  Laterality: Right;   COLONOSCOPY WITH PROPOFOL N/A 09/21/2015   Procedure: COLONOSCOPY WITH PROPOFOL;  Surgeon: Midge Minium, MD;  Location: [facility];  Service: Endoscopy;  Laterality: N/A;  Diabetic - oral meds sleep apnea   SESMOIDECTOMY Right 03/07/2022   Procedure: SESMOIDECTOMY;  Surgeon: Gwyneth Revels, DPM;  Location: [facility];  Service: Podiatry;  Laterality: Right;   VASCULAR SURGERY Bilateral    legs   WOUND DEBRIDEMENT Right 03/07/2022   Procedure: DEBRIDEMENT OF RIGHT FOOT ULCER;  Surgeon: Gwyneth Revels, DPM;  Location: [facility];  Service: Podiatry;  Laterality: Right;    Home Medications:  Allergies as of 02/27/2023   No Known Allergies      Medication List        Accurate as of February 27, 2023  2:38 PM. If you have any questions, ask your nurse or doctor.          aspirin EC 81 MG tablet Take 81 mg by mouth daily.   atorvastatin 20 MG tablet Commonly known as: LIPITOR Take 1 tablet (20 mg total) by mouth daily.   benazepril 40 MG tablet Commonly known as: LOTENSIN Take 1 tablet (40 mg total) by mouth daily.   dapagliflozin propanediol 10 MG Tabs tablet Commonly known as: Farxiga Take 10 mg by mouth daily.   Fish Oil 1000 MG Caps Take 1,000 mg by mouth daily.  glucose blood test strip Please dispense insurance preference. Use to check BS BID   metFORMIN 500 MG 24 hr tablet Commonly known as: GLUCOPHAGE-XR Take 2 tablets (1,000 mg total) by mouth 2 (two) times daily.   MULTIVITAMIN GUMMIES ADULT PO Take by mouth daily.   Pen Needles 31G X 8 MM Misc 1 each by Does not apply route once a week.   sildenafil 100 MG tablet Commonly known as: VIAGRA Take 1 tab 1 hour prior to intercourse Started by: Cody Burke   silodosin 8 MG Caps capsule Commonly known as: RAPAFLO Take 1 capsule (8 mg  total) by mouth daily with breakfast. Started by: Cody Burke        Allergies: No Known Allergies  Family History: Family History  Problem Relation Age of Onset   Cancer Mother    Hypertension Father     Social History:  reports that he quit smoking about 19 years ago. His smoking use included cigarettes. He has never used smokeless tobacco. He reports current alcohol use. He reports that he does not use drugs.   Physical Exam: BP (!) 163/90   Pulse 90   Ht 6' (1.829 m)   Wt (!) 310 lb (140.6 kg)   BMI 42.04 kg/m   Constitutional:  Alert and oriented, No acute distress. HEENT: [place] AT Respiratory: Normal respiratory effort, no increased work of breathing. GU: Prostate 30 grams, smooth without nodules. Psychiatric: Normal mood and affect.   Urinalysis 3+ glucose on dipstick, microscopy negative.  Assessment & Plan:    1. BPH with LUTS We discussed the most common cause of lower urinary tract symptoms in his age group is prostate enlargement. PVR today 121 mL  Rx silodosin 8 mg daily sent to pharmacy 1 month follow-up for symptom reassessment and repeat IPSS.   2. Erectile dysfunction Interested in a trial of sildenafil and Rx 100mg  tablets sent to pharmacy.   I have reviewed the above documentation for accuracy and completeness, and I agree with the above.   Cody Altes, MD  [facility] [address] [place], [place] [address] [phone]

## 2023-02-28 ENCOUNTER — Encounter: Payer: Self-pay | Admitting: Urology

## 2023-03-06 DIAGNOSIS — I739 Peripheral vascular disease, unspecified: Secondary | ICD-10-CM | POA: Diagnosis not present

## 2023-03-06 DIAGNOSIS — Z89411 Acquired absence of right great toe: Secondary | ICD-10-CM | POA: Diagnosis not present

## 2023-03-06 DIAGNOSIS — L97511 Non-pressure chronic ulcer of other part of right foot limited to breakdown of skin: Secondary | ICD-10-CM | POA: Diagnosis not present

## 2023-03-06 DIAGNOSIS — E1142 Type 2 diabetes mellitus with diabetic polyneuropathy: Secondary | ICD-10-CM | POA: Diagnosis not present

## 2023-03-13 LAB — HM DIABETES EYE EXAM

## 2023-03-18 DIAGNOSIS — L97512 Non-pressure chronic ulcer of other part of right foot with fat layer exposed: Secondary | ICD-10-CM | POA: Diagnosis not present

## 2023-03-18 DIAGNOSIS — E1142 Type 2 diabetes mellitus with diabetic polyneuropathy: Secondary | ICD-10-CM | POA: Diagnosis not present

## 2023-03-22 ENCOUNTER — Other Ambulatory Visit: Payer: Self-pay | Admitting: Urology

## 2023-03-23 ENCOUNTER — Encounter: Payer: Self-pay | Admitting: Family Medicine

## 2023-03-23 DIAGNOSIS — E11628 Type 2 diabetes mellitus with other skin complications: Secondary | ICD-10-CM | POA: Diagnosis not present

## 2023-03-23 DIAGNOSIS — Z8249 Family history of ischemic heart disease and other diseases of the circulatory system: Secondary | ICD-10-CM

## 2023-03-23 DIAGNOSIS — Z6841 Body Mass Index (BMI) 40.0 and over, adult: Secondary | ICD-10-CM | POA: Diagnosis not present

## 2023-03-23 DIAGNOSIS — L97519 Non-pressure chronic ulcer of other part of right foot with unspecified severity: Secondary | ICD-10-CM | POA: Diagnosis not present

## 2023-03-23 DIAGNOSIS — E785 Hyperlipidemia, unspecified: Secondary | ICD-10-CM | POA: Diagnosis not present

## 2023-03-23 DIAGNOSIS — Z7984 Long term (current) use of oral hypoglycemic drugs: Secondary | ICD-10-CM | POA: Diagnosis not present

## 2023-03-23 DIAGNOSIS — E871 Hypo-osmolality and hyponatremia: Secondary | ICD-10-CM | POA: Diagnosis not present

## 2023-03-23 DIAGNOSIS — E1151 Type 2 diabetes mellitus with diabetic peripheral angiopathy without gangrene: Secondary | ICD-10-CM | POA: Diagnosis not present

## 2023-03-23 DIAGNOSIS — E8881 Metabolic syndrome: Secondary | ICD-10-CM | POA: Diagnosis not present

## 2023-03-23 DIAGNOSIS — Z1152 Encounter for screening for COVID-19: Secondary | ICD-10-CM

## 2023-03-23 DIAGNOSIS — E1165 Type 2 diabetes mellitus with hyperglycemia: Secondary | ICD-10-CM | POA: Diagnosis present

## 2023-03-23 DIAGNOSIS — G4733 Obstructive sleep apnea (adult) (pediatric): Secondary | ICD-10-CM | POA: Diagnosis not present

## 2023-03-23 DIAGNOSIS — Z79899 Other long term (current) drug therapy: Secondary | ICD-10-CM

## 2023-03-23 DIAGNOSIS — L03119 Cellulitis of unspecified part of limb: Secondary | ICD-10-CM | POA: Diagnosis not present

## 2023-03-23 DIAGNOSIS — Z7982 Long term (current) use of aspirin: Secondary | ICD-10-CM

## 2023-03-23 DIAGNOSIS — L03115 Cellulitis of right lower limb: Secondary | ICD-10-CM | POA: Diagnosis not present

## 2023-03-23 DIAGNOSIS — Z89411 Acquired absence of right great toe: Secondary | ICD-10-CM

## 2023-03-23 DIAGNOSIS — E11621 Type 2 diabetes mellitus with foot ulcer: Secondary | ICD-10-CM | POA: Diagnosis not present

## 2023-03-23 DIAGNOSIS — G4730 Sleep apnea, unspecified: Secondary | ICD-10-CM | POA: Diagnosis not present

## 2023-03-23 DIAGNOSIS — I10 Essential (primary) hypertension: Secondary | ICD-10-CM | POA: Diagnosis not present

## 2023-03-23 DIAGNOSIS — M7989 Other specified soft tissue disorders: Secondary | ICD-10-CM | POA: Diagnosis not present

## 2023-03-23 DIAGNOSIS — M2041 Other hammer toe(s) (acquired), right foot: Secondary | ICD-10-CM | POA: Diagnosis not present

## 2023-03-23 DIAGNOSIS — E1169 Type 2 diabetes mellitus with other specified complication: Secondary | ICD-10-CM | POA: Diagnosis not present

## 2023-03-23 DIAGNOSIS — Z87891 Personal history of nicotine dependence: Secondary | ICD-10-CM | POA: Diagnosis not present

## 2023-03-24 ENCOUNTER — Telehealth: Payer: Self-pay | Admitting: Family Medicine

## 2023-03-24 ENCOUNTER — Other Ambulatory Visit: Payer: Self-pay

## 2023-03-24 ENCOUNTER — Ambulatory Visit: Payer: BC Managed Care – PPO | Admitting: Family Medicine

## 2023-03-24 ENCOUNTER — Emergency Department: Payer: BC Managed Care – PPO

## 2023-03-24 ENCOUNTER — Inpatient Hospital Stay
Admission: EM | Admit: 2023-03-24 | Discharge: 2023-03-27 | DRG: 638 | Disposition: A | Discharge status: Home / Self Care | Payer: BC Managed Care – PPO | Attending: Internal Medicine | Admitting: Internal Medicine

## 2023-03-24 DIAGNOSIS — Z7982 Long term (current) use of aspirin: Secondary | ICD-10-CM | POA: Diagnosis not present

## 2023-03-24 DIAGNOSIS — E8881 Metabolic syndrome: Secondary | ICD-10-CM | POA: Diagnosis present

## 2023-03-24 DIAGNOSIS — G4733 Obstructive sleep apnea (adult) (pediatric): Secondary | ICD-10-CM | POA: Diagnosis not present

## 2023-03-24 DIAGNOSIS — E1165 Type 2 diabetes mellitus with hyperglycemia: Secondary | ICD-10-CM | POA: Diagnosis present

## 2023-03-24 DIAGNOSIS — E1169 Type 2 diabetes mellitus with other specified complication: Secondary | ICD-10-CM | POA: Diagnosis not present

## 2023-03-24 DIAGNOSIS — L03119 Cellulitis of unspecified part of limb: Secondary | ICD-10-CM | POA: Diagnosis not present

## 2023-03-24 DIAGNOSIS — L089 Local infection of the skin and subcutaneous tissue, unspecified: Secondary | ICD-10-CM

## 2023-03-24 DIAGNOSIS — Z6841 Body Mass Index (BMI) 40.0 and over, adult: Secondary | ICD-10-CM | POA: Diagnosis not present

## 2023-03-24 DIAGNOSIS — L03115 Cellulitis of right lower limb: Principal | ICD-10-CM

## 2023-03-24 DIAGNOSIS — Z8249 Family history of ischemic heart disease and other diseases of the circulatory system: Secondary | ICD-10-CM | POA: Diagnosis not present

## 2023-03-24 DIAGNOSIS — E11628 Type 2 diabetes mellitus with other skin complications: Secondary | ICD-10-CM | POA: Diagnosis present

## 2023-03-24 DIAGNOSIS — E119 Type 2 diabetes mellitus without complications: Secondary | ICD-10-CM

## 2023-03-24 DIAGNOSIS — Z7984 Long term (current) use of oral hypoglycemic drugs: Secondary | ICD-10-CM | POA: Diagnosis not present

## 2023-03-24 DIAGNOSIS — Z79899 Other long term (current) drug therapy: Secondary | ICD-10-CM | POA: Diagnosis not present

## 2023-03-24 DIAGNOSIS — E785 Hyperlipidemia, unspecified: Secondary | ICD-10-CM | POA: Diagnosis present

## 2023-03-24 DIAGNOSIS — Z1152 Encounter for screening for COVID-19: Secondary | ICD-10-CM | POA: Diagnosis not present

## 2023-03-24 DIAGNOSIS — I10 Essential (primary) hypertension: Secondary | ICD-10-CM | POA: Diagnosis present

## 2023-03-24 DIAGNOSIS — L97519 Non-pressure chronic ulcer of other part of right foot with unspecified severity: Secondary | ICD-10-CM | POA: Diagnosis present

## 2023-03-24 DIAGNOSIS — E1151 Type 2 diabetes mellitus with diabetic peripheral angiopathy without gangrene: Secondary | ICD-10-CM | POA: Diagnosis present

## 2023-03-24 DIAGNOSIS — E11621 Type 2 diabetes mellitus with foot ulcer: Secondary | ICD-10-CM | POA: Diagnosis present

## 2023-03-24 DIAGNOSIS — G4730 Sleep apnea, unspecified: Secondary | ICD-10-CM | POA: Diagnosis present

## 2023-03-24 DIAGNOSIS — Z89411 Acquired absence of right great toe: Secondary | ICD-10-CM | POA: Diagnosis not present

## 2023-03-24 DIAGNOSIS — Z87891 Personal history of nicotine dependence: Secondary | ICD-10-CM | POA: Diagnosis not present

## 2023-03-24 LAB — URINALYSIS, ROUTINE W REFLEX MICROSCOPIC
Bacteria, UA: NONE SEEN
Bilirubin Urine: NEGATIVE
Glucose, UA: >=500 mg/dL — AB
Hgb urine dipstick: NEGATIVE
Ketones, ur: NEGATIVE mg/dL
Leukocytes,Ua: NEGATIVE
Nitrite: NEGATIVE
Protein, ur: NEGATIVE mg/dL
Specific Gravity, Urine: 1.037 — ABNORMAL HIGH (ref 1.005–1.030)
pH: 5 (ref 5.0–8.0)

## 2023-03-24 LAB — PROCALCITONIN: Procalcitonin: 2.6 ng/mL

## 2023-03-24 LAB — CBC
HCT: 45.5 % (ref 39.0–52.0)
Hemoglobin: 15.5 g/dL (ref 13.0–17.0)
MCH: 30.6 pg (ref 26.0–34.0)
MCHC: 34.1 g/dL (ref 30.0–36.0)
MCV: 89.7 fL (ref 80.0–100.0)
Platelets: 189 10*3/uL (ref 150–400)
RBC: 5.07 MIL/uL (ref 4.22–5.81)
RDW: 14.3 % (ref 11.5–15.5)
WBC: 12.7 10*3/uL — ABNORMAL HIGH (ref 4.0–10.5)
nRBC: 0 % (ref 0.0–0.2)

## 2023-03-24 LAB — BASIC METABOLIC PANEL
Anion gap: 10 (ref 5–15)
BUN: 17 mg/dL (ref 6–20)
CO2: 21 mmol/L — ABNORMAL LOW (ref 22–32)
Calcium: 8.3 mg/dL — ABNORMAL LOW (ref 8.9–10.3)
Chloride: 101 mmol/L (ref 98–111)
Creatinine, Ser: 0.95 mg/dL (ref 0.61–1.24)
GFR, Estimated: >60 mL/min (ref >60)
Glucose, Bld: 243 mg/dL — ABNORMAL HIGH (ref 70–99)
Potassium: 3.6 mmol/L (ref 3.5–5.1)
Sodium: 132 mmol/L — ABNORMAL LOW (ref 135–145)

## 2023-03-24 LAB — HEMOGLOBIN A1C
Hgb A1c MFr Bld: 7.7 % — ABNORMAL HIGH (ref 4.8–5.6)
Mean Plasma Glucose: 174.29 mg/dL

## 2023-03-24 LAB — GLUCOSE, CAPILLARY
Glucose-Capillary: 148 mg/dL — ABNORMAL HIGH (ref 70–99)
Glucose-Capillary: 160 mg/dL — ABNORMAL HIGH (ref 70–99)
Glucose-Capillary: 161 mg/dL — ABNORMAL HIGH (ref 70–99)
Glucose-Capillary: 134 mg/dL — ABNORMAL HIGH (ref 70–99)

## 2023-03-24 LAB — LACTIC ACID, PLASMA
Lactic Acid, Venous: 1.5 mmol/L (ref 0.5–1.9)
Lactic Acid, Venous: 1.5 mmol/L (ref 0.5–1.9)

## 2023-03-24 LAB — PREALBUMIN: Prealbumin: 11 mg/dL — ABNORMAL LOW (ref 18–38)

## 2023-03-24 LAB — SEDIMENTATION RATE: Sed Rate: 39 mm/h — ABNORMAL HIGH (ref 0–20)

## 2023-03-24 LAB — C-REACTIVE PROTEIN: CRP: 17 mg/dL — ABNORMAL HIGH (ref <1.0)

## 2023-03-24 MED ORDER — MORPHINE SULFATE (PF) 2 MG/ML IV SOLN: 2.0000 mg | INTRAVENOUS | Status: DC | PRN

## 2023-03-24 MED ORDER — ONDANSETRON HCL 4 MG PO TABS: 4.0000 mg | ORAL_TABLET | Freq: Four times a day (QID) | ORAL | Status: DC | PRN

## 2023-03-24 MED ORDER — ENOXAPARIN SODIUM 80 MG/0.8ML IJ SOSY
70.0000 mg | PREFILLED_SYRINGE | INTRAMUSCULAR | Status: DC
  Administered 2023-03-24 – 2023-03-26 (×3): 70 mg via SUBCUTANEOUS
  Filled 2023-03-24 (×3): qty 0.7

## 2023-03-24 MED ORDER — INSULIN ASPART 100 UNIT/ML IJ SOLN
0.0000 [IU] | Freq: Every day | INTRAMUSCULAR | Status: DC
  Administered 2023-03-26: 2 [IU] via SUBCUTANEOUS
  Filled 2023-03-24: qty 1

## 2023-03-24 MED ORDER — ATORVASTATIN CALCIUM 20 MG PO TABS
20.0000 mg | ORAL_TABLET | Freq: Every day | ORAL | Status: DC
  Administered 2023-03-24 – 2023-03-27 (×4): 20 mg via ORAL
  Filled 2023-03-24 (×4): qty 1

## 2023-03-24 MED ORDER — SODIUM CHLORIDE 0.9 % IV BOLUS
1000.0000 mL | Freq: Once | INTRAVENOUS | Status: AC
  Administered 2023-03-24: 1000 mL via INTRAVENOUS

## 2023-03-24 MED ORDER — ACETAMINOPHEN 650 MG RE SUPP: 650.0000 mg | Freq: Four times a day (QID) | RECTAL | Status: DC | PRN

## 2023-03-24 MED ORDER — INSULIN ASPART 100 UNIT/ML IJ SOLN
0.0000 [IU] | Freq: Three times a day (TID) | INTRAMUSCULAR | Status: DC
  Administered 2023-03-24 (×2): 4 [IU] via SUBCUTANEOUS
  Administered 2023-03-24: 3 [IU] via SUBCUTANEOUS
  Administered 2023-03-25: 4 [IU] via SUBCUTANEOUS
  Administered 2023-03-25: 3 [IU] via SUBCUTANEOUS
  Administered 2023-03-26: 4 [IU] via SUBCUTANEOUS
  Administered 2023-03-26: 3 [IU] via SUBCUTANEOUS
  Administered 2023-03-27: 4 [IU] via SUBCUTANEOUS
  Administered 2023-03-27: 3 [IU] via SUBCUTANEOUS
  Filled 2023-03-24 (×9): qty 1

## 2023-03-24 MED ORDER — ONDANSETRON HCL 4 MG/2ML IJ SOLN: 4.0000 mg | Freq: Four times a day (QID) | INTRAMUSCULAR | Status: DC | PRN

## 2023-03-24 MED ORDER — METRONIDAZOLE 500 MG/100ML IV SOLN
500.0000 mg | Freq: Two times a day (BID) | INTRAVENOUS | Status: DC
  Administered 2023-03-24 – 2023-03-27 (×7): 500 mg via INTRAVENOUS
  Filled 2023-03-24 (×7): qty 100

## 2023-03-24 MED ORDER — HYDROCODONE-ACETAMINOPHEN 5-325 MG PO TABS
1.0000 | ORAL_TABLET | ORAL | Status: DC | PRN
  Administered 2023-03-25 – 2023-03-26 (×2): 2 via ORAL
  Filled 2023-03-24 (×2): qty 2

## 2023-03-24 MED ORDER — CEFEPIME HCL 2 G IV SOLR
2.0000 g | Freq: Once | INTRAVENOUS | Status: AC
  Administered 2023-03-24: 2 g via INTRAVENOUS
  Filled 2023-03-24: qty 12.5

## 2023-03-24 MED ORDER — ADULT MULTIVITAMIN W/MINERALS CH
1.0000 | ORAL_TABLET | Freq: Every day | ORAL | Status: DC
  Administered 2023-03-25 – 2023-03-27 (×3): 1 via ORAL
  Filled 2023-03-24 (×3): qty 1

## 2023-03-24 MED ORDER — CEFTRIAXONE SODIUM 2 G IJ SOLR
2.0000 g | INTRAMUSCULAR | Status: DC
  Administered 2023-03-24 – 2023-03-27 (×4): 2 g via INTRAVENOUS
  Filled 2023-03-24 (×4): qty 20

## 2023-03-24 MED ORDER — VITAMIN C 500 MG PO TABS
500.0000 mg | ORAL_TABLET | Freq: Two times a day (BID) | ORAL | Status: DC
  Administered 2023-03-24 – 2023-03-27 (×6): 500 mg via ORAL
  Filled 2023-03-24 (×6): qty 1

## 2023-03-24 MED ORDER — ACETAMINOPHEN 325 MG PO TABS
650.0000 mg | ORAL_TABLET | Freq: Four times a day (QID) | ORAL | Status: DC | PRN
  Administered 2023-03-24: 650 mg via ORAL
  Filled 2023-03-24 (×3): qty 2

## 2023-03-24 MED ORDER — ASPIRIN 81 MG PO TBEC
81.0000 mg | DELAYED_RELEASE_TABLET | Freq: Every day | ORAL | Status: DC
  Administered 2023-03-24 – 2023-03-27 (×4): 81 mg via ORAL
  Filled 2023-03-24 (×4): qty 1

## 2023-03-24 NOTE — Telephone Encounter (Signed)
Copied from CRM [id number]. Topic: General - Other >> Mar 24, 2023  8:40 AM Lennox Pippins wrote: Patient's wife, Merdis Delay, called and states that patient has been admitted to the hospital. Patient had an appt this morning at 9:20a with Rashelle Pearly, I have cancelled this appt.

## 2023-03-24 NOTE — Plan of Care (Signed)
Problem: Education: Goal: Ability to describe self-care measures that may prevent or decrease complications (Diabetes Survival Skills Education) will improve 03/24/2023 1404 by Latanya Maudlin, RN Outcome: Progressing 03/24/2023 1403 by Latanya Maudlin, RN Outcome: Progressing 03/24/2023 1018 by Latanya Maudlin, RN Outcome: Progressing Goal: Individualized Educational Video(s) 03/24/2023 1404 by Latanya Maudlin, RN Outcome: Progressing 03/24/2023 1403 by Latanya Maudlin, RN Outcome: Progressing 03/24/2023 1018 by Latanya Maudlin, RN Outcome: Progressing   Problem: Coping: Goal: Ability to adjust to condition or change in health will improve 03/24/2023 1404 by Latanya Maudlin, RN Outcome: Progressing 03/24/2023 1403 by Latanya Maudlin, RN Outcome: Progressing 03/24/2023 1018 by Latanya Maudlin, RN Outcome: Progressing   Problem: Fluid Volume: Goal: Ability to maintain a balanced intake and output will improve 03/24/2023 1404 by Latanya Maudlin, RN Outcome: Progressing 03/24/2023 1403 by Latanya Maudlin, RN Outcome: Progressing 03/24/2023 1018 by Latanya Maudlin, RN Outcome: Progressing   Problem: Health Behavior/Discharge Planning: Goal: Ability to identify and utilize available resources and services will improve 03/24/2023 1404 by Latanya Maudlin, RN Outcome: Progressing 03/24/2023 1403 by Latanya Maudlin, RN Outcome: Progressing 03/24/2023 1018 by Latanya Maudlin, RN Outcome: Progressing Goal: Ability to manage health-related needs will improve 03/24/2023 1404 by Latanya Maudlin, RN Outcome: Progressing 03/24/2023 1403 by Latanya Maudlin, RN Outcome: Progressing 03/24/2023 1018 by Latanya Maudlin, RN Outcome: Progressing   Problem: Metabolic: Goal: Ability to maintain appropriate glucose levels will improve 03/24/2023 1404 by Latanya Maudlin, RN Outcome: Progressing 03/24/2023 1403 by Latanya Maudlin, RN Outcome: Progressing 03/24/2023  1018 by Latanya Maudlin, RN Outcome: Progressing   Problem: Nutritional: Goal: Maintenance of adequate nutrition will improve 03/24/2023 1404 by Latanya Maudlin, RN Outcome: Progressing 03/24/2023 1403 by Latanya Maudlin, RN Outcome: Progressing 03/24/2023 1018 by Latanya Maudlin, RN Outcome: Progressing Goal: Progress toward achieving an optimal weight will improve 03/24/2023 1404 by Latanya Maudlin, RN Outcome: Progressing 03/24/2023 1403 by Latanya Maudlin, RN Outcome: Progressing 03/24/2023 1018 by Latanya Maudlin, RN Outcome: Progressing   Problem: Skin Integrity: Goal: Risk for impaired skin integrity will decrease 03/24/2023 1404 by Latanya Maudlin, RN Outcome: Progressing 03/24/2023 1403 by Latanya Maudlin, RN Outcome: Progressing 03/24/2023 1018 by Latanya Maudlin, RN Outcome: Progressing   Problem: Tissue Perfusion: Goal: Adequacy of tissue perfusion will improve 03/24/2023 1404 by Latanya Maudlin, RN Outcome: Progressing 03/24/2023 1403 by Latanya Maudlin, RN Outcome: Progressing 03/24/2023 1018 by Latanya Maudlin, RN Outcome: Progressing   Problem: Education: Goal: Knowledge of General Education information will improve Description: Including pain rating scale, medication(s)/side effects and non-pharmacologic comfort measures 03/24/2023 1404 by Latanya Maudlin, RN Outcome: Progressing 03/24/2023 1403 by Latanya Maudlin, RN Outcome: Progressing 03/24/2023 1018 by Latanya Maudlin, RN Outcome: Progressing   Problem: Health Behavior/Discharge Planning: Goal: Ability to manage health-related needs will improve 03/24/2023 1404 by Latanya Maudlin, RN Outcome: Progressing 03/24/2023 1403 by Latanya Maudlin, RN Outcome: Progressing 03/24/2023 1018 by Latanya Maudlin, RN Outcome: Progressing   Problem: Clinical Measurements: Goal: Ability to maintain clinical measurements within normal limits will improve 03/24/2023 1404 by Latanya Maudlin, RN Outcome: Progressing 03/24/2023 1403 by Latanya Maudlin, RN Outcome: Progressing 03/24/2023 1018 by Latanya Maudlin, RN Outcome: Progressing Goal: Will remain free from infection 03/24/2023 1404 by Latanya Maudlin, RN Outcome: Progressing 03/24/2023 1403 by Latanya Maudlin, RN Outcome: Progressing 03/24/2023 1018 by  Latanya Maudlin, RN Outcome: Progressing Goal: Diagnostic test results will improve 03/24/2023 1404 by Latanya Maudlin, RN Outcome: Progressing 03/24/2023 1403 by Latanya Maudlin, RN Outcome: Progressing 03/24/2023 1018 by Latanya Maudlin, RN Outcome: Progressing Goal: Respiratory complications will improve 03/24/2023 1404 by Latanya Maudlin, RN Outcome: Progressing 03/24/2023 1403 by Latanya Maudlin, RN Outcome: Progressing 03/24/2023 1018 by Latanya Maudlin, RN Outcome: Progressing Goal: Cardiovascular complication will be avoided 03/24/2023 1404 by Latanya Maudlin, RN Outcome: Progressing 03/24/2023 1403 by Latanya Maudlin, RN Outcome: Progressing 03/24/2023 1018 by Latanya Maudlin, RN Outcome: Progressing   Problem: Activity: Goal: Risk for activity intolerance will decrease 03/24/2023 1404 by Latanya Maudlin, RN Outcome: Progressing 03/24/2023 1403 by Latanya Maudlin, RN Outcome: Progressing 03/24/2023 1018 by Latanya Maudlin, RN Outcome: Progressing   Problem: Nutrition: Goal: Adequate nutrition will be maintained 03/24/2023 1404 by Latanya Maudlin, RN Outcome: Progressing 03/24/2023 1403 by Latanya Maudlin, RN Outcome: Progressing 03/24/2023 1018 by Latanya Maudlin, RN Outcome: Progressing   Problem: Coping: Goal: Level of anxiety will decrease 03/24/2023 1404 by Latanya Maudlin, RN Outcome: Progressing 03/24/2023 1403 by Latanya Maudlin, RN Outcome: Progressing 03/24/2023 1018 by Latanya Maudlin, RN Outcome: Progressing   Problem: Elimination: Goal: Will not experience complications related to bowel  motility 03/24/2023 1404 by Latanya Maudlin, RN Outcome: Progressing 03/24/2023 1403 by Latanya Maudlin, RN Outcome: Progressing 03/24/2023 1018 by Latanya Maudlin, RN Outcome: Progressing Goal: Will not experience complications related to urinary retention 03/24/2023 1404 by Latanya Maudlin, RN Outcome: Progressing 03/24/2023 1403 by Latanya Maudlin, RN Outcome: Progressing 03/24/2023 1018 by Latanya Maudlin, RN Outcome: Progressing   Problem: Pain Management: Goal: General experience of comfort will improve 03/24/2023 1404 by Latanya Maudlin, RN Outcome: Progressing 03/24/2023 1403 by Latanya Maudlin, RN Outcome: Progressing 03/24/2023 1018 by Latanya Maudlin, RN Outcome: Progressing   Problem: Safety: Goal: Ability to remain free from injury will improve 03/24/2023 1404 by Latanya Maudlin, RN Outcome: Progressing 03/24/2023 1403 by Latanya Maudlin, RN Outcome: Progressing 03/24/2023 1018 by Latanya Maudlin, RN Outcome: Progressing   Problem: Skin Integrity: Goal: Risk for impaired skin integrity will decrease 03/24/2023 1404 by Latanya Maudlin, RN Outcome: Progressing 03/24/2023 1403 by Latanya Maudlin, RN Outcome: Progressing 03/24/2023 1018 by Latanya Maudlin, RN Outcome: Progressing

## 2023-03-24 NOTE — Progress Notes (Signed)
Initial Nutrition Assessment  DOCUMENTATION CODES:   Morbid obesity  INTERVENTION:   Double protein with meals  MVI po daily   Vitamin C 500mg  po BID  NUTRITION DIAGNOSIS:   Increased nutrient needs related to wound healing as evidenced by estimated needs.  GOAL:   Patient will meet greater than or equal to 90% of their needs  MONITOR:   PO intake, Supplement acceptance, Labs, Weight trends, I & O's, Skin  REASON FOR ASSESSMENT:   Consult Assessment of nutrition requirement/status, Wound healing  ASSESSMENT:   57 y/o male with h/o HLD, HTN, DM, OSA and PVD who is admitted with right lower extremity cellulitis.  Met with pt in room today. Pt reports good appetite and oral intake at baseline and in hospital. Per chart, pt appears weight stable at baseline. RD discussed with pt the importance of adequate nutrition needed to preserve lean muscle and support wound healing. RD will add double protein with meal trays and vitamins to support wound healing.    Medications reviewed and include: aspirin, lovenox, insulin, ceftriaxone, metronidazole   Labs reviewed: Na 132(L) Wbc- 12.7(H) Cbgs- 160, 148 x 24 hrs  AIC 7.7(H)- 2/13  NUTRITION - FOCUSED PHYSICAL EXAM:  Flowsheet Row Most Recent Value  Orbital Region No depletion  Upper Arm Region No depletion  Thoracic and Lumbar Region No depletion  Buccal Region No depletion  Temple Region No depletion  Clavicle Bone Region No depletion  Clavicle and Acromion Bone Region No depletion  Scapular Bone Region No depletion  Dorsal Hand No depletion  Patellar Region No depletion  Anterior Thigh Region No depletion  Posterior Calf Region No depletion  Edema (RD Assessment) Mild  [RLE]  Hair Reviewed  Eyes Reviewed  Mouth Reviewed  Skin Reviewed  Nails Reviewed   Diet Order:   Diet Order             Diet Carb Modified Fluid consistency: Thin; Room service appropriate? Yes  Diet effective now                   EDUCATION NEEDS:   Education needs have been addressed  Skin:  Skin Assessment: Reviewed RN Assessment  Last BM:  10/29  Height:   Ht Readings from Last 1 Encounters:  03/24/23 6' (1.829 m)    Weight:   Wt Readings from Last 1 Encounters:  03/24/23 (!) 140.6 kg    Ideal Body Weight:  80.9 kg  BMI:  Body mass index is 42.04 kg/m.  Estimated Nutritional Needs:   Kcal:  2500-2800kcal/day  Protein:  125-140g/day  Fluid:  2.5-2.8L/day  Cody Holiday MS, RD, LDN Please refer to [facility] for RD and/or RD on-call/weekend/after hours pager

## 2023-03-24 NOTE — ED Provider Notes (Signed)
[facility] Provider Note    Event Date/Time   First MD Initiated Contact with Patient 03/24/23 0259     (approximate)   History   Leg Swelling   HPI  Cody Burke is a 57 y.o. male who presents to the ED for evaluation of Leg Swelling   Review of PCP visit from 2 months ago.Morbidly obese patient with metabolic syndrome.  Patient presents for evaluation of fever and chills subjectively, red rash to his right leg.  Has a chronic wound to the plantar aspect of his right foot that his wife changes his dressings on every 2 days   Physical Exam   Triage Vital Signs: ED Triage Vitals  Encounter Vitals Group     BP 03/24/23 0005 131/74     Systolic BP Percentile --      Diastolic BP Percentile --      Pulse Rate 03/24/23 0005 88     Resp 03/24/23 0005 18     Temp 03/24/23 0005 99.4 F (37.4 C)     Temp Source 03/24/23 0005 Oral     SpO2 03/24/23 0005 98 %     Weight 03/24/23 0003 (!) 310 lb (140.6 kg)     Height 03/24/23 0003 6' (1.829 m)     Head Circumference --      Peak Flow --      Pain Score 03/24/23 0003 7     Pain Loc --      Pain Education --      Exclude from Growth Chart --     Most recent vital signs: Vitals:   03/24/23 0600 03/24/23 0655  BP: 122/73 129/61  Pulse: 79 80  Resp: 18   Temp:  98.6 F (37 C)  SpO2: 97% 97%    General: Awake, no distress.  CV:  Good peripheral perfusion.  Resp:  Normal effort.  Abd:  No distention.  MSK:  No deformity noted.  Neuro:  No focal deficits appreciated. Other:  As below, area of erythema, warmth and swelling to the right lower leg that is asymmetric when compared to the left.  Consistent with cellulitis.  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  Small amount of blood on the overlying dressing        ED Results / Procedures / Treatments   Labs (all labs ordered are listed, but only abnormal results are  displayed) Labs Reviewed  CBC - Abnormal; Notable for the following components:      Result Value   WBC 12.7 (*)    All other components within normal limits  BASIC METABOLIC PANEL - Abnormal; Notable for the following components:   Sodium 132 (*)    CO2 21 (*)    Glucose, Bld 243 (*)    Calcium 8.3 (*)    All other components within normal limits  URINALYSIS, ROUTINE W REFLEX MICROSCOPIC - Abnormal; Notable for the following components:   Color, Urine YELLOW (*)    APPearance CLEAR (*)    Specific Gravity, Urine 1.037 (*)    Glucose, UA >=500 (*)    All other components within normal limits  CULTURE, BLOOD (ROUTINE X 2)  CULTURE, BLOOD (ROUTINE X 2)  LACTIC ACID, PLASMA  PROCALCITONIN  LACTIC ACID, PLASMA  HEMOGLOBIN A1C    EKG   RADIOLOGY Plain film of the right foot interpreted by me without evidence of osteomyelitis or fracture  Ultrasound of the right leg interpreted by me without evidence of DVT  Official radiology report(s): DG Foot Complete Right  Result Date: 03/24/2023 CLINICAL DATA:  Diabetic foot infection, ulcer at the first MTP. EXAM: RIGHT FOOT COMPLETE - 3 VIEW COMPARISON:  05/09/2022 FINDINGS: First ray amputation with antibiotic beads no longer seen. There is heterotopic ossification in the resection bed. Mature appearing osteotomy margin at the remaining base of the first MTP. No acute erosion or generalized soft tissue emphysema. Hammertoe deformity of the remaining toes. Generalized subcutaneous reticulation. IMPRESSION: No soft tissue emphysema or evidence of osteomyelitis. Electronically Signed   By: Tiburcio Pea M.D.   On: 03/24/2023 06:00   US Venous Img Lower Unilateral Right  Result Date: 03/24/2023 CLINICAL DATA:  57 year old male with history of swelling, redness and bruising in the right lower extremity. Evaluate for deep venous thrombosis. EXAM: RIGHT LOWER EXTREMITY VENOUS DOPPLER ULTRASOUND TECHNIQUE: Gray-scale sonography with compression,  as well as color and duplex ultrasound, were performed to evaluate the deep venous system(s) from the level of the common femoral vein through the popliteal and proximal calf veins. COMPARISON:  None Available. FINDINGS: VENOUS Normal compressibility of the common femoral, superficial femoral, and popliteal veins, as well as the visualized calf veins. Visualized portions of profunda femoral vein and great saphenous vein unremarkable. No filling defects to suggest DVT on grayscale or color Doppler imaging. Doppler waveforms show normal direction of venous flow, normal respiratory plasticity and response to augmentation. Limited views of the contralateral common femoral vein are unremarkable. OTHER None. Limitations: none IMPRESSION: Negative. Electronically Signed   By: Trudie Reed M.D.   On: 03/24/2023 04:56    PROCEDURES and INTERVENTIONS:  Procedures  Medications  insulin aspart (novoLOG) injection 0-20 Units (has no administration in time range)  insulin aspart (novoLOG) injection 0-5 Units (has no administration in time range)  acetaminophen (TYLENOL) tablet 650 mg (has no administration in time range)    Or  acetaminophen (TYLENOL) suppository 650 mg (has no administration in time range)  HYDROcodone-acetaminophen (NORCO/VICODIN) 5-325 MG per tablet 1-2 tablet (has no administration in time range)  morphine (PF) 2 MG/ML injection 2 mg (has no administration in time range)  ondansetron (ZOFRAN) tablet 4 mg (has no administration in time range)    Or  ondansetron (ZOFRAN) injection 4 mg (has no administration in time range)  ceFEPIme (MAXIPIME) 2 g in sodium chloride 0.9 % 100 mL IVPB (0 g Intravenous Stopped 03/24/23 0542)  sodium chloride 0.9 % bolus 1,000 mL (0 mLs Intravenous Stopped 03/24/23 0542)     IMPRESSION / MDM / ASSESSMENT AND PLAN / ED COURSE  I reviewed the triage vital signs and the nursing notes.  Differential diagnosis includes, but is not limited to, cellulitis,  DVT, sepsis, diabetic foot infection, osteomyelitis  {Patient presents with symptoms of an acute illness or injury that is potentially life-threatening.  Pleasant patient with history of metabolic syndrome presents with cellulitis of the right leg requiring medical admission.  Has reassuring vital signs here but reports fevers at home.  Leukocytosis is noted.  Hyperglycemia without acidosis.  Normal lactic acid but elevated procalcitonin.  X-ray without signs of osteo-.  No signs of DVT on ultrasound.  Started on antibiotics after cultures are drawn.  Clinical Course as of 03/24/23 4098  Tue Mar 24, 2023  0527 I consult with medicine, who agrees to admit [DS]    Clinical Course User Index [DS] Delton Prairie, MD     FINAL CLINICAL  IMPRESSION(S) / ED DIAGNOSES   Final diagnoses:  Cellulitis of right lower extremity  Diabetic foot infection (HCC)     Rx / DC Orders   ED Discharge Orders     None        Note:  This document was prepared using Dragon voice recognition software and may include unintentional dictation errors.   Delton Prairie, MD 03/24/23 [phone]

## 2023-03-24 NOTE — Assessment & Plan Note (Signed)
Worsening right lower extremity cellulitis in the setting of baseline chronic right diabetic foot wound followed by podiatry White count 12.7 Afebrile Will place on IV Rocephin and Flagyl for infectious coverage Blood cultures drawn in the ER Loraine Leriche and date affected area Monitor

## 2023-03-24 NOTE — Telephone Encounter (Signed)
FYI

## 2023-03-24 NOTE — Plan of Care (Signed)

## 2023-03-24 NOTE — Telephone Encounter (Signed)
Noted  

## 2023-03-24 NOTE — H&P (Addendum)
History and Physical    Patient: Cody Burke MWU:[id number] DOB: 1965/07/15 DOA: 03/24/2023 DOS: the patient was seen and examined on 03/24/2023 PCP: Dorcas Carrow, DO  Patient coming from: Home  Chief Complaint:  Chief Complaint  Patient presents with   Leg Swelling   HPI: Cody Burke is a 57 y.o. male with medical history significant of type 2 diabetes, obesity, hypertension, hyperlipidemia, peripheral vascular disease, sleep apnea presenting with right lower extremity cellulitis.  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  Has had a pressure ulcer over that affected area followed by outpatient podiatry.  Per report, patient developed some systemic fevers and chills over the weekend.  Family initially thought this is secondary to upper respiratory infection.  Had testing done including COVID flu and RSV that were negative.  Noticed worsening right lower extremity redness and swelling. No chest pain, shortness of breath.  No nausea or vomiting.  Denies any known trauma to cause symptoms.  Denies any purulent drainage from right first ray ulcer.  Has had stable follow-up podiatry. Presented to the ER afebrile, hemodynamically stable.  White count 12.7, hemoglobin 15.5, platelets 189, creatinine 0.95, glucose 243.  Procalcitonin 2.6.  Lactate 1.5.  Right foot plain films within normal limits.  Right lower extremity ultrasound negative for PE. Review of Systems: As mentioned in the history of present illness. All other systems reviewed and are negative. Past Medical History:  Diagnosis Date   Diabetes mellitus without complication (HCC)    Hyperlipidemia    Hypertension    Obesity    Peripheral vascular disease (HCC)    Sleep apnea    severe - new DX - has not gotten CPAP yet   Past Surgical History:  Procedure Laterality Date   AMPUTATION Right 05/09/2022   Procedure: AMPUTATION FIRST RAY;  Surgeon: Rosetta Posner, DPM;  Location: [facility];   Service: Podiatry;  Laterality: Right;   ARTHRODESIS METATARSALPHALANGEAL JOINT (MTPJ) Right 03/07/2022   Procedure: 72536 - Richarda Blade;  Surgeon: Gwyneth Revels, DPM;  Location: [facility];  Service: Podiatry;  Laterality: Right;   COLONOSCOPY WITH PROPOFOL N/A 09/21/2015   Procedure: COLONOSCOPY WITH PROPOFOL;  Surgeon: Midge Minium, MD;  Location: [facility];  Service: Endoscopy;  Laterality: N/A;  Diabetic - oral meds sleep apnea   SESMOIDECTOMY Right 03/07/2022   Procedure: SESMOIDECTOMY;  Surgeon: Gwyneth Revels, DPM;  Location: [facility];  Service: Podiatry;  Laterality: Right;   VASCULAR SURGERY Bilateral    legs   WOUND DEBRIDEMENT Right 03/07/2022   Procedure: DEBRIDEMENT OF RIGHT FOOT ULCER;  Surgeon: Gwyneth Revels, DPM;  Location: [facility];  Service: Podiatry;  Laterality: Right;   Social History:  reports that he quit smoking about 19 years ago. His smoking use included cigarettes. He has never used smokeless tobacco. He reports current alcohol use. He reports that he does not use drugs.  No Known Allergies  Family History  Problem Relation Age of Onset   Cancer Mother    Hypertension Father     Prior to Admission medications   Medication Sig Start Date End Date Taking? Authorizing Provider  aspirin EC 81 MG tablet Take 81 mg by mouth daily.   Yes [provider]  atorvastatin (LIPITOR) 20 MG tablet Take 1 tablet (20 mg total) by mouth daily. 10/07/22  Yes Johnson, Megan P, DO  benazepril (LOTENSIN) 40 MG tablet Take 1 tablet (40 mg total) by mouth daily. 10/07/22  Yes Johnson, Megan P, DO  dapagliflozin propanediol (FARXIGA) 10 MG TABS tablet Take 10 mg by mouth daily. 10/07/22  Yes Johnson, Megan P, DO  metFORMIN (GLUCOPHAGE-XR) 500 MG 24 hr tablet Take 2 tablets (1,000 mg total) by mouth 2 (two) times daily. 10/07/22  Yes Johnson, Megan P, DO  Multiple Vitamins-Minerals (MULTIVITAMIN GUMMIES ADULT PO) Take by mouth daily.   Yes [provider]  Omega-3 Fatty Acids (FISH OIL) 1000 MG CAPS Take 1,000 mg by mouth daily.   Yes [provider]  sildenafil (VIAGRA) 100 MG tablet Take 1 tab 1 hour prior to intercourse 02/27/23  Yes Stoioff, Verna Czech, MD  silodosin (RAPAFLO) 8 MG CAPS capsule TAKE 1 CAPSULE BY MOUTH DAILY WITH BREAKFAST. 03/23/23  Yes Stoioff, Verna Czech, MD  glucose blood test strip Please dispense insurance preference. Use to check BS BID 11/09/19   Aura Dials T, NP    Physical Exam: Vitals:   03/24/23 0447 03/24/23 0600 03/24/23 0655 03/24/23 0734  BP:  122/73 129/61 124/77  Pulse:  79 80 79  Resp: 18 18  17   Temp: 98.2 F (36.8 C)  98.6 F (37 C)   TempSrc:      SpO2:  97% 97% 99%  Weight:      Height:       Physical Exam Constitutional:      Appearance: He is obese.  HENT:     Head: Normocephalic and atraumatic.     Mouth/Throat:     Mouth: Mucous membranes are moist.  Eyes:     Pupils: Pupils are equal, round, and reactive to light.  Cardiovascular:     Rate and Rhythm: Normal rate and regular rhythm.  Pulmonary:     Effort: Pulmonary effort is normal.  Abdominal:     General: Bowel sounds are normal.  Musculoskeletal:        General: Normal range of motion.  Skin:    Comments: See picture    Neurological:     General: No focal deficit present.  Psychiatric:        Mood and Affect: Mood normal.       Data Reviewed:  There are no new results to review at this time.  DG Foot Complete Right CLINICAL DATA:  Diabetic foot infection, ulcer at the first MTP.  EXAM: RIGHT FOOT COMPLETE - 3 VIEW  COMPARISON:  05/09/2022  FINDINGS: First ray amputation with antibiotic beads no longer seen. There is heterotopic ossification in the resection bed. Mature appearing osteotomy margin at the remaining base of the first MTP. No acute erosion or generalized soft tissue emphysema. Hammertoe deformity of the remaining toes. Generalized subcutaneous reticulation.  IMPRESSION: No  soft tissue emphysema or evidence of osteomyelitis.  Electronically Signed   By: Tiburcio Pea M.D.   On: 03/24/2023 06:00 US Venous Img Lower Unilateral Right CLINICAL DATA:  57 year old male with history of swelling, redness and bruising in the right lower extremity. Evaluate for deep venous thrombosis.  EXAM: RIGHT LOWER EXTREMITY VENOUS DOPPLER ULTRASOUND  TECHNIQUE: Gray-scale sonography with compression, as well as color and duplex ultrasound, were performed to evaluate the deep venous system(s) from the level of the common femoral vein through the popliteal and proximal calf veins.  COMPARISON:  None Available.  FINDINGS: VENOUS  Normal compressibility of the common femoral, superficial femoral, and popliteal veins, as well as the visualized calf veins. Visualized portions of profunda femoral vein and great saphenous vein unremarkable. No filling defects to suggest DVT on grayscale or color Doppler  imaging. Doppler waveforms show normal direction of venous flow, normal respiratory plasticity and response to augmentation.  Limited views of the contralateral common femoral vein are unremarkable.  OTHER  None.  Limitations: none  IMPRESSION: Negative.  Electronically Signed   By: Trudie Reed M.D.   On: 03/24/2023 04:56  Lab Results  Component Value Date   WBC 12.7 (H) 03/24/2023   HGB 15.5 03/24/2023   HCT 45.5 03/24/2023   MCV 89.7 03/24/2023   PLT 189 03/24/2023   Last metabolic panel Lab Results  Component Value Date   GLUCOSE 243 (H) 03/24/2023   NA 132 (L) 03/24/2023   K 3.6 03/24/2023   CL 101 03/24/2023   CO2 21 (L) 03/24/2023   BUN 17 03/24/2023   CREATININE 0.95 03/24/2023   GFRNONAA >60 03/24/2023   CALCIUM 8.3 (L) 03/24/2023   PROT 7.0 10/07/2022   ALBUMIN 4.3 10/07/2022   LABGLOB 2.7 10/07/2022   AGRATIO 1.6 10/07/2022   BILITOT 0.4 10/07/2022   ALKPHOS 106 10/07/2022   AST 27 10/07/2022   ALT 31 10/07/2022   ANIONGAP  10 03/24/2023    Assessment and Plan: * Cellulitis in diabetic foot (HCC) Worsening right lower extremity cellulitis in the setting of baseline chronic right diabetic foot wound followed by podiatry White count 12.7 Afebrile Will place on IV Rocephin and Flagyl for infectious coverage Blood cultures drawn in the ER Mark and date affected area Monitor  Hypertension BP stable Titrate home regimen  Hyperlipidemia Continue statin  Type 2 diabetes mellitus (HCC) Blood sugar in 240s in the setting of concurrent right lower extremity cellulitis SSI Monitor      Advance Care Planning:   Code Status: Full Code   Consults: None   Family Communication: Wife at the bedside   Severity of Illness: The appropriate patient status for this patient is INPATIENT. Inpatient status is judged to be reasonable and necessary in order to provide the required intensity of service to ensure the patient's safety. The patient's presenting symptoms, physical exam findings, and initial radiographic and laboratory data in the context of their chronic comorbidities is felt to place them at high risk for further clinical deterioration. Furthermore, it is not anticipated that the patient will be medically stable for discharge from the hospital within 2 midnights of admission.   * I certify that at the point of admission it is my clinical judgment that the patient will require inpatient hospital care spanning beyond 2 midnights from the point of admission due to high intensity of service, high risk for further deterioration and high frequency of surveillance required.*  Author: Floydene Flock, MD 03/24/2023 8:42 AM  For on call review [email or web address].

## 2023-03-24 NOTE — ED Triage Notes (Signed)
Pt to ED via POV c/o right leg swelling. Pt reports this started last night, swelling and redness noted to right leg. Pt said he also feels like he pulled a muscle in his groin on that side. Denies any fevers today, had fever sat and sunday

## 2023-03-24 NOTE — Assessment & Plan Note (Signed)
Blood sugar in 240s in the setting of concurrent right lower extremity cellulitis SSI Monitor

## 2023-03-24 NOTE — Assessment & Plan Note (Signed)
BP stable Titrate home regimen 

## 2023-03-24 NOTE — Assessment & Plan Note (Signed)
Continue statin. 

## 2023-03-25 DIAGNOSIS — L03119 Cellulitis of unspecified part of limb: Secondary | ICD-10-CM | POA: Diagnosis not present

## 2023-03-25 DIAGNOSIS — E11628 Type 2 diabetes mellitus with other skin complications: Secondary | ICD-10-CM | POA: Diagnosis not present

## 2023-03-25 LAB — CBC
HCT: 44.7 % (ref 39.0–52.0)
Hemoglobin: 14.9 g/dL (ref 13.0–17.0)
MCH: 29.7 pg (ref 26.0–34.0)
MCHC: 33.3 g/dL (ref 30.0–36.0)
MCV: 89.2 fL (ref 80.0–100.0)
Platelets: 215 10*3/uL (ref 150–400)
RBC: 5.01 MIL/uL (ref 4.22–5.81)
RDW: 14.2 % (ref 11.5–15.5)
WBC: 15.4 10*3/uL — ABNORMAL HIGH (ref 4.0–10.5)
nRBC: 0.1 % (ref 0.0–0.2)

## 2023-03-25 LAB — COMPREHENSIVE METABOLIC PANEL
ALT: 27 U/L (ref 0–44)
AST: 20 U/L (ref 15–41)
Albumin: 2.7 g/dL — ABNORMAL LOW (ref 3.5–5.0)
Alkaline Phosphatase: 81 U/L (ref 38–126)
Anion gap: 9 (ref 5–15)
BUN: 12 mg/dL (ref 6–20)
CO2: 24 mmol/L (ref 22–32)
Calcium: 7.9 mg/dL — ABNORMAL LOW (ref 8.9–10.3)
Chloride: 100 mmol/L (ref 98–111)
Creatinine, Ser: 0.81 mg/dL (ref 0.61–1.24)
GFR, Estimated: >60 mL/min (ref >60)
Glucose, Bld: 146 mg/dL — ABNORMAL HIGH (ref 70–99)
Potassium: 3.9 mmol/L (ref 3.5–5.1)
Sodium: 133 mmol/L — ABNORMAL LOW (ref 135–145)
Total Bilirubin: 0.8 mg/dL (ref 0.3–1.2)
Total Protein: 6.6 g/dL (ref 6.5–8.1)

## 2023-03-25 LAB — GLUCOSE, CAPILLARY
Glucose-Capillary: 137 mg/dL — ABNORMAL HIGH (ref 70–99)
Glucose-Capillary: 201 mg/dL — ABNORMAL HIGH (ref 70–99)
Glucose-Capillary: 180 mg/dL — ABNORMAL HIGH (ref 70–99)
Glucose-Capillary: 173 mg/dL — ABNORMAL HIGH (ref 70–99)

## 2023-03-25 MED ORDER — TAMSULOSIN HCL 0.4 MG PO CAPS
0.4000 mg | ORAL_CAPSULE | Freq: Every day | ORAL | Status: DC
  Administered 2023-03-25 – 2023-03-27 (×3): 0.4 mg via ORAL
  Filled 2023-03-25 (×3): qty 1

## 2023-03-25 MED ORDER — DAPAGLIFLOZIN PROPANEDIOL 10 MG PO TABS
10.0000 mg | ORAL_TABLET | Freq: Every day | ORAL | Status: DC
  Administered 2023-03-25 – 2023-03-27 (×3): 10 mg via ORAL
  Filled 2023-03-25 (×3): qty 1

## 2023-03-25 MED ORDER — BENAZEPRIL HCL 20 MG PO TABS
40.0000 mg | ORAL_TABLET | Freq: Every day | ORAL | Status: DC
  Administered 2023-03-25 – 2023-03-27 (×3): 40 mg via ORAL
  Filled 2023-03-25 (×3): qty 2

## 2023-03-25 NOTE — Plan of Care (Signed)

## 2023-03-25 NOTE — Progress Notes (Signed)
PROGRESS NOTE    Cody Burke  YQM:[id number] DOB: 1965-10-18 DOA: 03/24/2023 PCP: Dorcas Carrow, DO    Brief Narrative:   y.o. male with medical history significant of type 2 diabetes, obesity, hypertension, hyperlipidemia, peripheral vascular disease, sleep apnea presenting with right lower extremity cellulitis.  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  Has had a pressure ulcer over that affected area followed by outpatient podiatry.  Per report, patient developed some systemic fevers and chills over the weekend.  Family initially thought this is secondary to upper respiratory infection.  Had testing done including COVID flu and RSV that were negative.  Noticed worsening right lower extremity redness and swelling. No chest pain, shortness of breath.  No nausea or vomiting.  Denies any known trauma to cause symptoms.  Denies any purulent drainage from right first ray ulcer.  Has had stable follow-up podiatry. Presented to the ER afebrile, hemodynamically stable.  White count 12.7, hemoglobin 15.5, platelets 189, creatinine 0.95, glucose 243.  Procalcitonin 2.6.  Lactate 1.5.  Right foot plain films within normal limits.  Right lower extremity ultrasound negative for DVT   Assessment & Plan:   Principal Problem:   Cellulitis in diabetic foot (HCC) Active Problems:   Hypertension   Hyperlipidemia   Type 2 diabetes mellitus (HCC)  * Cellulitis in diabetic foot (HCC) Worsening right lower extremity cellulitis in the setting of baseline chronic right diabetic foot wound followed by podiatry White count 12.7-->14 Afebrile, but did endorse fevers and chills at home Plan: Continue rocephin Continue flagyl for now, can likely de-escalate in 24h Follow blood cultures Loraine Leriche and date affected area Unna boot placed per podiatry recommendations   Hypertension BP stable Restart home regimen   Hyperlipidemia Continue statin per home dose   Type 2  diabetes mellitus (HCC) Blood sugar in 240s in the setting of concurrent right lower extremity cellulitis Continue SSI and carb diet Continue CBG ACHS  Obesity, stage IV BMI 42.5.  Complicates overall care and prognosis   DVT prophylaxis: Lovenox Code Status: Full Family Communication: Spouse at bedside 10/30 Disposition Plan: Status is: Inpatient Remains inpatient appropriate because: Lower extremity cellulitis on IV antibiotics   Level of care: Med-Surg  Consultants:  None  Procedures:  None  Antimicrobials: Rocephin Flagyl    Subjective: Seen and examined.  Resting in bed.  No visible distress.  Complains of mild pain in right lower extremity.  Objective: Vitals:   03/24/23 1419 03/25/23 0005 03/25/23 0500 03/25/23 0734  BP: (!) 142/75 139/78  (!) 148/73  Pulse: 96 76  78  Resp: 16 16  16   Temp: 98.5 F (36.9 C) 98.8 F (37.1 C)  98.1 F (36.7 C)  TempSrc:      SpO2: 98% 98%  100%  Weight:   (!) 142.2 kg   Height:        Intake/Output Summary (Last 24 hours) at 03/25/2023 1245 Last data filed at 03/25/2023 0300 Gross per 24 hour  Intake 300 ml  Output --  Net 300 ml   Filed Weights   03/24/23 0003 03/25/23 0500  Weight: (!) 140.6 kg (!) 142.2 kg    Examination:  General exam: Appears calm and comfortable  Respiratory system: Clear to auscultation. Respiratory effort normal. Cardiovascular system: S1-2, RRR, no murmurs, no pedal edema Gastrointestinal system: Obese, soft, NT/ND, normal bowel sounds Central nervous system: Alert and oriented. No focal neurological deficits. Extremities: Right lower leg erythematous, tender to touch, warm.  Foot  in surgical wraps Skin: Right lower extremity cellulitis Psychiatry: Judgement and insight appear normal. Mood & affect appropriate.     Data Reviewed: I have personally reviewed following labs and imaging studies  CBC: Recent Labs  Lab 03/24/23 0010 03/25/23 0410  WBC 12.7* 15.4*  HGB 15.5 14.9   HCT 45.5 44.7  MCV 89.7 89.2  PLT 189 215   Basic Metabolic Panel: Recent Labs  Lab 03/24/23 0010 03/25/23 0410  NA 132* 133*  K 3.6 3.9  CL 101 100  CO2 21* 24  GLUCOSE 243* 146*  BUN 17 12  CREATININE 0.95 0.81  CALCIUM 8.3* 7.9*   GFR: Estimated Creatinine Clearance: 145.4 mL/min (by C-G formula based on SCr of 0.81 mg/dL). Liver Function Tests: Recent Labs  Lab 03/25/23 0410  AST 20  ALT 27  ALKPHOS 81  BILITOT 0.8  PROT 6.6  ALBUMIN 2.7*   No results for input(s): "LIPASE", "AMYLASE" in the last 168 hours. No results for input(s): "AMMONIA" in the last 168 hours. Coagulation Profile: No results for input(s): "INR", "PROTIME" in the last 168 hours. Cardiac Enzymes: No results for input(s): "CKTOTAL", "CKMB", "CKMBINDEX", "TROPONINI" in the last 168 hours. BNP (last 3 results) No results for input(s): "PROBNP" in the last 8760 hours. HbA1C: Recent Labs    03/24/23 0712  HGBA1C 7.7*   CBG: Recent Labs  Lab 03/24/23 1130 03/24/23 1700 03/24/23 2143 03/25/23 0732 03/25/23 1152  GLUCAP 160* 161* 134* 137* 201*   Lipid Profile: No results for input(s): "CHOL", "HDL", "LDLCALC", "TRIG", "CHOLHDL", "LDLDIRECT" in the last 72 hours. Thyroid Function Tests: No results for input(s): "TSH", "T4TOTAL", "FREET4", "T3FREE", "THYROIDAB" in the last 72 hours. Anemia Panel: No results for input(s): "VITAMINB12", "FOLATE", "FERRITIN", "TIBC", "IRON", "RETICCTPCT" in the last 72 hours. Sepsis Labs: Recent Labs  Lab 03/24/23 0426 03/24/23 0712  PROCALCITON 2.60  --   LATICACIDVEN 1.5 1.5    Recent Results (from the past 240 hour(s))  Blood culture (routine x 2)     Status: None (Preliminary result)   Collection Time: 03/24/23  4:26 AM   Specimen: BLOOD  Result Value Ref Range Status   Specimen Description BLOOD LEFT WRIST  Final   Special Requests   Final    BOTTLES DRAWN AEROBIC AND ANAEROBIC Blood Culture results may not be optimal due to an excessive  volume of blood received in culture bottles   Culture   Final    NO GROWTH <12 HOURS Performed at [facility], [address] [place], [place] [address]    Report Status PENDING  Incomplete  Blood culture (routine x 2)     Status: None (Preliminary result)   Collection Time: 03/24/23  4:26 AM   Specimen: BLOOD  Result Value Ref Range Status   Specimen Description BLOOD LEFT FA  Final   Special Requests   Final    BOTTLES DRAWN AEROBIC AND ANAEROBIC Blood Culture results may not be optimal due to an excessive volume of blood received in culture bottles   Culture   Final    NO GROWTH < 12 HOURS Performed at [facility], [address], [place], [place] [address]    Report Status PENDING  Incomplete         Radiology Studies: DG Foot Complete Right  Result Date: 03/24/2023 CLINICAL DATA:  Diabetic foot infection, ulcer at the first MTP. EXAM: RIGHT FOOT COMPLETE - 3 VIEW COMPARISON:  05/09/2022 FINDINGS: First ray amputation with antibiotic beads no longer  seen. There is heterotopic ossification in the resection bed. Mature appearing osteotomy margin at the remaining base of the first MTP. No acute erosion or generalized soft tissue emphysema. Hammertoe deformity of the remaining toes. Generalized subcutaneous reticulation. IMPRESSION: No soft tissue emphysema or evidence of osteomyelitis. Electronically Signed   By: Tiburcio Pea M.D.   On: 03/24/2023 06:00   US Venous Img Lower Unilateral Right  Result Date: 03/24/2023 CLINICAL DATA:  57 year old male with history of swelling, redness and bruising in the right lower extremity. Evaluate for deep venous thrombosis. EXAM: RIGHT LOWER EXTREMITY VENOUS DOPPLER ULTRASOUND TECHNIQUE: Gray-scale sonography with compression, as well as color and duplex ultrasound, were performed to evaluate the deep venous system(s) from the level of the common femoral vein through the popliteal and proximal calf veins. COMPARISON:   None Available. FINDINGS: VENOUS Normal compressibility of the common femoral, superficial femoral, and popliteal veins, as well as the visualized calf veins. Visualized portions of profunda femoral vein and great saphenous vein unremarkable. No filling defects to suggest DVT on grayscale or color Doppler imaging. Doppler waveforms show normal direction of venous flow, normal respiratory plasticity and response to augmentation. Limited views of the contralateral common femoral vein are unremarkable. OTHER None. Limitations: none IMPRESSION: Negative. Electronically Signed   By: Trudie Reed M.D.   On: 03/24/2023 04:56        Scheduled Meds:  vitamin C  500 mg Oral BID   aspirin EC  81 mg Oral Daily   atorvastatin  20 mg Oral Daily   benazepril  40 mg Oral Daily   dapagliflozin propanediol  10 mg Oral Daily   enoxaparin (LOVENOX) injection  70 mg Subcutaneous Q24H   insulin aspart  0-20 Units Subcutaneous TID WC   insulin aspart  0-5 Units Subcutaneous QHS   multivitamin with minerals  1 tablet Oral Daily   tamsulosin  0.4 mg Oral Daily   Continuous Infusions:  cefTRIAXone (ROCEPHIN)  IV 2 g (03/25/23 1205)   And   metronidazole 500 mg (03/25/23 1033)     LOS: 1 day      Tresa Moore, MD [facility]   If 7PM-7AM, please contact night-coverage  03/25/2023, 12:45 PM

## 2023-03-26 DIAGNOSIS — E11628 Type 2 diabetes mellitus with other skin complications: Secondary | ICD-10-CM | POA: Diagnosis not present

## 2023-03-26 DIAGNOSIS — L03119 Cellulitis of unspecified part of limb: Secondary | ICD-10-CM | POA: Diagnosis not present

## 2023-03-26 LAB — BASIC METABOLIC PANEL
Anion gap: 11 (ref 5–15)
BUN: 14 mg/dL (ref 6–20)
CO2: 23 mmol/L (ref 22–32)
Calcium: 8.2 mg/dL — ABNORMAL LOW (ref 8.9–10.3)
Chloride: 97 mmol/L — ABNORMAL LOW (ref 98–111)
Creatinine, Ser: 0.69 mg/dL (ref 0.61–1.24)
GFR, Estimated: >60 mL/min (ref >60)
Glucose, Bld: 149 mg/dL — ABNORMAL HIGH (ref 70–99)
Potassium: 3.9 mmol/L (ref 3.5–5.1)
Sodium: 131 mmol/L — ABNORMAL LOW (ref 135–145)

## 2023-03-26 LAB — CBC WITH DIFFERENTIAL/PLATELET
Abs Immature Granulocytes: 0.81 10*3/uL — ABNORMAL HIGH (ref 0.00–0.07)
Basophils Absolute: 0.2 10*3/uL — ABNORMAL HIGH (ref 0.0–0.1)
Basophils Relative: 1 %
Eosinophils Absolute: 0.3 10*3/uL (ref 0.0–0.5)
Eosinophils Relative: 2 %
HCT: 44.7 % (ref 39.0–52.0)
Hemoglobin: 15.4 g/dL (ref 13.0–17.0)
Immature Granulocytes: 4 %
Lymphocytes Relative: 19 %
Lymphs Abs: 3.6 10*3/uL (ref 0.7–4.0)
MCH: 30.3 pg (ref 26.0–34.0)
MCHC: 34.5 g/dL (ref 30.0–36.0)
MCV: 88 fL (ref 80.0–100.0)
Monocytes Absolute: 1.5 10*3/uL — ABNORMAL HIGH (ref 0.1–1.0)
Monocytes Relative: 8 %
Neutro Abs: 12.9 10*3/uL — ABNORMAL HIGH (ref 1.7–7.7)
Neutrophils Relative %: 66 %
Platelets: 226 10*3/uL (ref 150–400)
RBC: 5.08 MIL/uL (ref 4.22–5.81)
RDW: 14.2 % (ref 11.5–15.5)
WBC: 19.3 10*3/uL — ABNORMAL HIGH (ref 4.0–10.5)
nRBC: 0 % (ref 0.0–0.2)

## 2023-03-26 LAB — GLUCOSE, CAPILLARY
Glucose-Capillary: 119 mg/dL — ABNORMAL HIGH (ref 70–99)
Glucose-Capillary: 146 mg/dL — ABNORMAL HIGH (ref 70–99)
Glucose-Capillary: 167 mg/dL — ABNORMAL HIGH (ref 70–99)
Glucose-Capillary: 220 mg/dL — ABNORMAL HIGH (ref 70–99)

## 2023-03-26 MED ORDER — POLYETHYLENE GLYCOL 3350 17 G PO PACK
17.0000 g | PACK | Freq: Every day | ORAL | Status: DC
  Administered 2023-03-26: 17 g via ORAL
  Filled 2023-03-26 (×2): qty 1

## 2023-03-26 MED ORDER — BISACODYL 10 MG RE SUPP: 10.0000 mg | Freq: Every day | RECTAL | Status: DC | PRN

## 2023-03-26 MED ORDER — SENNOSIDES-DOCUSATE SODIUM 8.6-50 MG PO TABS
1.0000 | ORAL_TABLET | Freq: Two times a day (BID) | ORAL | Status: DC
  Administered 2023-03-26 – 2023-03-27 (×3): 1 via ORAL
  Filled 2023-03-26 (×3): qty 1

## 2023-03-26 NOTE — Plan of Care (Signed)

## 2023-03-26 NOTE — Progress Notes (Signed)
PROGRESS NOTE    Lang Lopezrodriguez  ZYS:[id number] DOB: July 03, 1965 DOA: 03/24/2023 PCP: Dorcas Carrow, DO    Brief Narrative:   y.o. male with medical history significant of type 2 diabetes, obesity, hypertension, hyperlipidemia, peripheral vascular disease, sleep apnea presenting with right lower extremity cellulitis.  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  Has had a pressure ulcer over that affected area followed by outpatient podiatry.  Per report, patient developed some systemic fevers and chills over the weekend.  Family initially thought this is secondary to upper respiratory infection.  Had testing done including COVID flu and RSV that were negative.  Noticed worsening right lower extremity redness and swelling. No chest pain, shortness of breath.  No nausea or vomiting.  Denies any known trauma to cause symptoms.  Denies any purulent drainage from right first ray ulcer.  Has had stable follow-up podiatry. Presented to the ER afebrile, hemodynamically stable.  White count 12.7, hemoglobin 15.5, platelets 189, creatinine 0.95, glucose 243.  Procalcitonin 2.6.  Lactate 1.5.  Right foot plain films within normal limits.  Right lower extremity ultrasound negative for DVT   Assessment & Plan:   Principal Problem:   Cellulitis in diabetic foot (HCC) Active Problems:   Hypertension   Hyperlipidemia   Type 2 diabetes mellitus (HCC)  * Cellulitis in diabetic foot (HCC) Worsening right lower extremity cellulitis in the setting of baseline chronic right diabetic foot wound followed by podiatry White count 12.7-->14-->18 Afebrile, but did endorse fevers and chills at home Plan: Continue rocephin Continue flagyl for now, can likely de-escalate in 24h Follow blood cultures, NGTD Mark and date affected area Radio broadcast assistant placed per podiatry recommendations   Hypertension BP stable Continue home regimen   Hyperlipidemia Continue statin per home dose    Type 2 diabetes mellitus (HCC) Blood sugar in 240s in the setting of concurrent right lower extremity cellulitis Continue SSI and carb diet Continue CBG ACHS  Obesity, stage IV BMI 42.5.  Complicates overall care and prognosis   DVT prophylaxis: Lovenox Code Status: Full Family Communication: Spouse at bedside 10/30 Disposition Plan: Status is: Inpatient Remains inpatient appropriate because: Lower extremity cellulitis on IV antibiotics   Level of care: Med-Surg  Consultants:  None  Procedures:  None  Antimicrobials: Rocephin Flagyl    Subjective: Seen and examined.  Sitting up in chair.  Unna boot in place.  Pain controlled.  No complaints.  Objective: Vitals:   03/25/23 1537 03/25/23 2341 03/26/23 0500 03/26/23 0822  BP: (!) 145/70 114/62  (!) 140/71  Pulse:  80  77  Resp: 17 20  18   Temp: 100.2 F (37.9 C) 98.6 F (37 C)  98 F (36.7 C)  TempSrc:  Oral  Oral  SpO2: 92% 93%  100%  Weight:   (!) 142.2 kg   Height:        Intake/Output Summary (Last 24 hours) at 03/26/2023 1136 Last data filed at 03/26/2023 0600 Gross per 24 hour  Intake 440 ml  Output --  Net 440 ml   Filed Weights   03/24/23 0003 03/25/23 0500 03/26/23 0500  Weight: (!) 140.6 kg (!) 142.2 kg (!) 142.2 kg    Examination:  General exam: No acute distress Respiratory system: Clear to auscultation. Respiratory effort normal. Cardiovascular system: S1-2, RRR, no murmurs, no pedal edema Gastrointestinal system: Obese, soft, NT/ND, normal bowel sounds Central nervous system: Alert and oriented. No focal neurological deficits. Extremities: Right lower extremity wrapped in [employer]  Skin: Right lower extremity cellulitis Psychiatry: Judgement and insight appear normal. Mood & affect appropriate.     Data Reviewed: I have personally reviewed following labs and imaging studies  CBC: Recent Labs  Lab 03/24/23 0010 03/25/23 0410 03/26/23 0826  WBC 12.7* 15.4* 19.3*  NEUTROABS   --   --  12.9*  HGB 15.5 14.9 15.4  HCT 45.5 44.7 44.7  MCV 89.7 89.2 88.0  PLT 189 215 226   Basic Metabolic Panel: Recent Labs  Lab 03/24/23 0010 03/25/23 0410 03/26/23 0826  NA 132* 133* 131*  K 3.6 3.9 3.9  CL 101 100 97*  CO2 21* 24 23  GLUCOSE 243* 146* 149*  BUN 17 12 14   CREATININE 0.95 0.81 0.69  CALCIUM 8.3* 7.9* 8.2*   GFR: Estimated Creatinine Clearance: 147.2 mL/min (by C-G formula based on SCr of 0.69 mg/dL). Liver Function Tests: Recent Labs  Lab 03/25/23 0410  AST 20  ALT 27  ALKPHOS 81  BILITOT 0.8  PROT 6.6  ALBUMIN 2.7*   No results for input(s): "LIPASE", "AMYLASE" in the last 168 hours. No results for input(s): "AMMONIA" in the last 168 hours. Coagulation Profile: No results for input(s): "INR", "PROTIME" in the last 168 hours. Cardiac Enzymes: No results for input(s): "CKTOTAL", "CKMB", "CKMBINDEX", "TROPONINI" in the last 168 hours. BNP (last 3 results) No results for input(s): "PROBNP" in the last 8760 hours. HbA1C: Recent Labs    03/24/23 0712  HGBA1C 7.7*   CBG: Recent Labs  Lab 03/25/23 0732 03/25/23 1152 03/25/23 1719 03/25/23 2103 03/26/23 0824  GLUCAP 137* 201* 180* 173* 119*   Lipid Profile: No results for input(s): "CHOL", "HDL", "LDLCALC", "TRIG", "CHOLHDL", "LDLDIRECT" in the last 72 hours. Thyroid Function Tests: No results for input(s): "TSH", "T4TOTAL", "FREET4", "T3FREE", "THYROIDAB" in the last 72 hours. Anemia Panel: No results for input(s): "VITAMINB12", "FOLATE", "FERRITIN", "TIBC", "IRON", "RETICCTPCT" in the last 72 hours. Sepsis Labs: Recent Labs  Lab 03/24/23 0426 03/24/23 0712  PROCALCITON 2.60  --   LATICACIDVEN 1.5 1.5    Recent Results (from the past 240 hour(s))  Blood culture (routine x 2)     Status: None (Preliminary result)   Collection Time: 03/24/23  4:26 AM   Specimen: BLOOD  Result Value Ref Range Status   Specimen Description BLOOD LEFT WRIST  Final   Special Requests   Final     BOTTLES DRAWN AEROBIC AND ANAEROBIC Blood Culture results may not be optimal due to an excessive volume of blood received in culture bottles   Culture   Final    NO GROWTH 2 DAYS Performed at [facility], [address], [place], [place] [address]    Report Status PENDING  Incomplete  Blood culture (routine x 2)     Status: None (Preliminary result)   Collection Time: 03/24/23  4:26 AM   Specimen: BLOOD  Result Value Ref Range Status   Specimen Description BLOOD LEFT FA  Final   Special Requests   Final    BOTTLES DRAWN AEROBIC AND ANAEROBIC Blood Culture results may not be optimal due to an excessive volume of blood received in culture bottles   Culture   Final    NO GROWTH 2 DAYS Performed at [facility], [address], [place], [place] [address]    Report Status PENDING  Incomplete         Radiology Studies: No results found.      Scheduled Meds:  vitamin C  500 mg  Oral BID   aspirin EC  81 mg Oral Daily   atorvastatin  20 mg Oral Daily   benazepril  40 mg Oral Daily   dapagliflozin propanediol  10 mg Oral Daily   enoxaparin (LOVENOX) injection  70 mg Subcutaneous Q24H   insulin aspart  0-20 Units Subcutaneous TID WC   insulin aspart  0-5 Units Subcutaneous QHS   multivitamin with minerals  1 tablet Oral Daily   polyethylene glycol  17 g Oral Daily   senna-docusate  1 tablet Oral BID   tamsulosin  0.4 mg Oral Daily   Continuous Infusions:  cefTRIAXone (ROCEPHIN)  IV 2 g (03/25/23 1205)   And   metronidazole 500 mg (03/26/23 0944)     LOS: 2 days      Tresa Moore, MD [facility]   If 7PM-7AM, please contact night-coverage  03/26/2023, 11:36 AM

## 2023-03-27 DIAGNOSIS — L03119 Cellulitis of unspecified part of limb: Secondary | ICD-10-CM | POA: Diagnosis not present

## 2023-03-27 DIAGNOSIS — E11628 Type 2 diabetes mellitus with other skin complications: Secondary | ICD-10-CM | POA: Diagnosis not present

## 2023-03-27 LAB — CBC WITH DIFFERENTIAL/PLATELET
Abs Immature Granulocytes: 1.26 10*3/uL — ABNORMAL HIGH (ref 0.00–0.07)
Basophils Absolute: 0.3 10*3/uL — ABNORMAL HIGH (ref 0.0–0.1)
Basophils Relative: 2 %
Eosinophils Absolute: 0.5 10*3/uL (ref 0.0–0.5)
Eosinophils Relative: 3 %
HCT: 46.9 % (ref 39.0–52.0)
Hemoglobin: 15.8 g/dL (ref 13.0–17.0)
Immature Granulocytes: 8 %
Lymphocytes Relative: 21 %
Lymphs Abs: 3.5 10*3/uL (ref 0.7–4.0)
MCH: 30 pg (ref 26.0–34.0)
MCHC: 33.7 g/dL (ref 30.0–36.0)
MCV: 89 fL (ref 80.0–100.0)
Monocytes Absolute: 1.3 10*3/uL — ABNORMAL HIGH (ref 0.1–1.0)
Monocytes Relative: 8 %
Neutro Abs: 10 10*3/uL — ABNORMAL HIGH (ref 1.7–7.7)
Neutrophils Relative %: 58 %
Platelets: 272 10*3/uL (ref 150–400)
RBC: 5.27 MIL/uL (ref 4.22–5.81)
RDW: 14.2 % (ref 11.5–15.5)
Smear Review: NORMAL
WBC: 16.8 10*3/uL — ABNORMAL HIGH (ref 4.0–10.5)
nRBC: 0 % (ref 0.0–0.2)

## 2023-03-27 LAB — GLUCOSE, CAPILLARY
Glucose-Capillary: 141 mg/dL — ABNORMAL HIGH (ref 70–99)
Glucose-Capillary: 185 mg/dL — ABNORMAL HIGH (ref 70–99)
Glucose-Capillary: 141 mg/dL — ABNORMAL HIGH (ref 70–99)

## 2023-03-27 LAB — BASIC METABOLIC PANEL
Anion gap: 11 (ref 5–15)
BUN: 13 mg/dL (ref 6–20)
CO2: 24 mmol/L (ref 22–32)
Calcium: 8.2 mg/dL — ABNORMAL LOW (ref 8.9–10.3)
Chloride: 97 mmol/L — ABNORMAL LOW (ref 98–111)
Creatinine, Ser: 0.73 mg/dL (ref 0.61–1.24)
GFR, Estimated: >60 mL/min (ref >60)
Glucose, Bld: 148 mg/dL — ABNORMAL HIGH (ref 70–99)
Potassium: 4.3 mmol/L (ref 3.5–5.1)
Sodium: 132 mmol/L — ABNORMAL LOW (ref 135–145)

## 2023-03-27 MED ORDER — AMOXICILLIN-POT CLAVULANATE 875-125 MG PO TABS: 1.0000 | ORAL_TABLET | Freq: Two times a day (BID) | ORAL | 0 refills | Status: AC

## 2023-03-27 MED ORDER — HYDROCODONE-ACETAMINOPHEN 5-325 MG PO TABS: 1.0000 | ORAL_TABLET | ORAL | 0 refills | Status: DC | PRN

## 2023-03-27 NOTE — Plan of Care (Signed)
  Problem: Coping: Goal: Ability to adjust to condition or change in health will improve Outcome: Progressing   Problem: Nutritional: Goal: Maintenance of adequate nutrition will improve Outcome: Progressing   Problem: Skin Integrity: Goal: Risk for impaired skin integrity will decrease Outcome: Progressing   Problem: Activity: Goal: Risk for activity intolerance will decrease Outcome: Progressing

## 2023-03-27 NOTE — Discharge Summary (Signed)
Physician Discharge Summary  Cody Burke WUJ:[id number] DOB: 26-May-1966 DOA: 03/24/2023  PCP: Dorcas Carrow, DO  Admit date: 03/24/2023 Discharge date: 03/27/2023  Admitted From: Home Disposition:  Home  Recommendations for Outpatient Follow-up:  Follow up with PCP in 1-2 weeks Follow-up with podiatry Wednesday 11/6  Home Health:No Equipment/Devices: Right lower extremity Unna boot  Discharge Condition:Stable CODE STATUS:FULL  Diet recommendation: Carb  Brief/Interim Summary:  57 y.o. male with medical history significant of type 2 diabetes, obesity, hypertension, hyperlipidemia, peripheral vascular disease, sleep apnea presenting with right lower extremity cellulitis.  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  Has had a pressure ulcer over that affected area followed by outpatient podiatry.  Per report, patient developed some systemic fevers and chills over the weekend.  Family initially thought this is secondary to upper respiratory infection.  Had testing done including COVID flu and RSV that were negative.  Noticed worsening right lower extremity redness and swelling. No chest pain, shortness of breath.  No nausea or vomiting.  Denies any known trauma to cause symptoms.  Denies any purulent drainage from right first ray ulcer.  Has had stable follow-up podiatry. Presented to the ER afebrile, hemodynamically stable.  White count 12.7, hemoglobin 15.5, platelets 189, creatinine 0.95, glucose 243.  Procalcitonin 2.6.  Lactate 1.5.  Right foot plain films within normal limits.  Right lower extremity ultrasound negative for DVT      Discharge Diagnoses:  Principal Problem:   Cellulitis in diabetic foot (HCC) Active Problems:   Hypertension   Hyperlipidemia   Type 2 diabetes mellitus (HCC)  * Cellulitis in diabetic foot (HCC) Worsening right lower extremity cellulitis in the setting of baseline chronic right diabetic foot wound followed by  podiatry White count 12.7-->14-->18-->16 Afebrile, but did endorse fevers and chills at home Plan: Okay for discharge home.  De-escalate to Augmentin.  Additional 7 days prescribed for total 10-day course.  Unna boot in place.  Defer to podiatry regarding care recommendations.  Patient can discharge home.  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  Complicates overall care and prognosis   Discharge Instructions   Allergies as of 03/27/2023   No Known Allergies      Medication List     TAKE these medications    amoxicillin-clavulanate 875-125 MG tablet Commonly known as: AUGMENTIN Take 1 tablet by mouth 2 (two) times daily for 7 days.   aspirin EC 81 MG tablet Take 81 mg by mouth daily.   atorvastatin 20 MG tablet Commonly known as: LIPITOR Take 1 tablet (20 mg total) by mouth daily.   benazepril 40 MG tablet Commonly known as: LOTENSIN Take 1 tablet (40 mg total) by mouth daily.   dapagliflozin propanediol 10 MG Tabs tablet Commonly known as: Farxiga Take 10 mg by mouth daily.   Fish Oil 1000 MG Caps Take 1,000 mg by mouth daily.   glucose blood test strip Please dispense insurance preference. Use to check BS BID   HYDROcodone-acetaminophen 5-325 MG tablet Commonly known as: NORCO/VICODIN Take 1-2 tablets by mouth every 4 (four) hours as needed for severe pain (pain score 7-10).   metFORMIN 500 MG 24 hr tablet Commonly known as: GLUCOPHAGE-XR  Take 2 tablets (1,000 mg total) by mouth 2 (two) times daily.   MULTIVITAMIN GUMMIES ADULT PO Take by mouth daily.   sildenafil 100 MG tablet Commonly known as: VIAGRA Take 1 tab 1 hour prior to intercourse   silodosin  8 MG Caps capsule Commonly known as: RAPAFLO TAKE 1 CAPSULE BY MOUTH DAILY WITH BREAKFAST.        No Known Allergies  Consultations: None   Procedures/Studies: DG Foot Complete Right  Result Date: 03/24/2023 CLINICAL DATA:  Diabetic foot infection, ulcer at the first MTP. EXAM: RIGHT FOOT COMPLETE - 3 VIEW COMPARISON:  05/09/2022 FINDINGS: First ray amputation with antibiotic beads no longer seen. There is heterotopic ossification in the resection bed. Mature appearing osteotomy margin at the remaining base of the first MTP. No acute erosion or generalized soft tissue emphysema. Hammertoe deformity of the remaining toes. Generalized subcutaneous reticulation. IMPRESSION: No soft tissue emphysema or evidence of osteomyelitis. Electronically Signed   By: Tiburcio Pea M.D.   On: 03/24/2023 06:00   US Venous Img Lower Unilateral Right  Result Date: 03/24/2023 CLINICAL DATA:  57 year old male with history of swelling, redness and bruising in the right lower extremity. Evaluate for deep venous thrombosis. EXAM: RIGHT LOWER EXTREMITY VENOUS DOPPLER ULTRASOUND TECHNIQUE: Gray-scale sonography with compression, as well as color and duplex ultrasound, were performed to evaluate the deep venous system(s) from the level of the common femoral vein through the popliteal and proximal calf veins. COMPARISON:  None Available. FINDINGS: VENOUS Normal compressibility of the common femoral, superficial femoral, and popliteal veins, as well as the visualized calf veins. Visualized portions of profunda femoral vein and great saphenous vein unremarkable. No filling defects to suggest DVT on grayscale or color Doppler imaging. Doppler waveforms show normal direction of venous flow, normal respiratory plasticity and response to augmentation. Limited views of the contralateral common femoral vein are unremarkable. OTHER None. Limitations: none IMPRESSION: Negative. Electronically Signed   By: Trudie Reed M.D.    On: 03/24/2023 04:56      Subjective: Seen and examined on the day of discharge.  Stable no distress.  Pain well-controlled.  Stable for discharge home.  Discharge Exam: Vitals:   03/27/23 0000 03/27/23 0733  BP: 106/62 128/72  Pulse: 77 77  Resp: 16 16  Temp: 98.4 F (36.9 C) 98.1 F (36.7 C)  SpO2: 94% 98%   Vitals:   03/26/23 1648 03/27/23 0000 03/27/23 0500 03/27/23 0733  BP: (!) 132/49 106/62  128/72  Pulse: 87 77  77  Resp: 19 16  16   Temp: 99.1 F (37.3 C) 98.4 F (36.9 C)  98.1 F (36.7 C)  TempSrc:      SpO2: 99% 94%  98%  Weight:   (!) 142 kg   Height:        General: Pt is alert, awake, not in acute distress Cardiovascular: RRR, S1/S2 +, no rubs, no gallops Respiratory: CTA bilaterally, no wheezing, no rhonchi Abdominal: Soft, NT, ND, bowel sounds + Extremities: Right lower extremity Unna boot    The results of significant diagnostics from this hospitalization (including imaging, microbiology, ancillary and laboratory) are listed below for reference.     Microbiology: Recent Results (from the past 240 hour(s))  Blood culture (routine x 2)     Status: None (Preliminary result)   Collection Time: 03/24/23  4:26 AM   Specimen: BLOOD  Result Value Ref Range Status   Specimen Description BLOOD LEFT WRIST  Final   Special Requests  Final    BOTTLES DRAWN AEROBIC AND ANAEROBIC Blood Culture results may not be optimal due to an excessive volume of blood received in culture bottles   Culture   Final    NO GROWTH 3 DAYS Performed at [facility], [address] [place], [place] [address]    Report Status PENDING  Incomplete  Blood culture (routine x 2)     Status: None (Preliminary result)   Collection Time: 03/24/23  4:26 AM   Specimen: BLOOD  Result Value Ref Range Status   Specimen Description BLOOD LEFT FA  Final   Special Requests   Final    BOTTLES DRAWN AEROBIC AND ANAEROBIC Blood Culture results may not be optimal due to an  excessive volume of blood received in culture bottles   Culture   Final    NO GROWTH 3 DAYS Performed at [facility], [address] [place], [place] [address]    Report Status PENDING  Incomplete     Labs: BNP (last 3 results) No results for input(s): "BNP" in the last 8760 hours. Basic Metabolic Panel: Recent Labs  Lab 03/24/23 0010 03/25/23 0410 03/26/23 0826 03/27/23 0831  NA 132* 133* 131* 132*  K 3.6 3.9 3.9 4.3  CL 101 100 97* 97*  CO2 21* 24 23 24   GLUCOSE 243* 146* 149* 148*  BUN 17 12 14 13   CREATININE 0.95 0.81 0.69 0.73  CALCIUM 8.3* 7.9* 8.2* 8.2*   Liver Function Tests: Recent Labs  Lab 03/25/23 0410  AST 20  ALT 27  ALKPHOS 81  BILITOT 0.8  PROT 6.6  ALBUMIN 2.7*   No results for input(s): "LIPASE", "AMYLASE" in the last 168 hours. No results for input(s): "AMMONIA" in the last 168 hours. CBC: Recent Labs  Lab 03/24/23 0010 03/25/23 0410 03/26/23 0826 03/27/23 0831  WBC 12.7* 15.4* 19.3* 16.8*  NEUTROABS  --   --  12.9* 10.0*  HGB 15.5 14.9 15.4 15.8  HCT 45.5 44.7 44.7 46.9  MCV 89.7 89.2 88.0 89.0  PLT 189 215 226 272   Cardiac Enzymes: No results for input(s): "CKTOTAL", "CKMB", "CKMBINDEX", "TROPONINI" in the last 168 hours. BNP: Invalid input(s): "POCBNP" CBG: Recent Labs  Lab 03/26/23 1226 03/26/23 1621 03/26/23 2109 03/27/23 0733 03/27/23 0959  GLUCAP 146* 167* 220* 141* 185*   D-Dimer No results for input(s): "DDIMER" in the last 72 hours. Hgb A1c No results for input(s): "HGBA1C" in the last 72 hours. Lipid Profile No results for input(s): "CHOL", "HDL", "LDLCALC", "TRIG", "CHOLHDL", "LDLDIRECT" in the last 72 hours. Thyroid function studies No results for input(s): "TSH", "T4TOTAL", "T3FREE", "THYROIDAB" in the last 72 hours.  Invalid input(s): "FREET3" Anemia work up No results for input(s): "VITAMINB12", "FOLATE", "FERRITIN", "TIBC", "IRON", "RETICCTPCT" in the last 72 hours. Urinalysis     Component Value Date/Time   COLORURINE YELLOW (A) 03/24/2023 0417   APPEARANCEUR CLEAR (A) 03/24/2023 0417   APPEARANCEUR Clear 02/27/2023 1009   LABSPEC 1.037 (H) 03/24/2023 0417   PHURINE 5.0 03/24/2023 0417   GLUCOSEU >=500 (A) 03/24/2023 0417   HGBUR NEGATIVE 03/24/2023 0417   BILIRUBINUR NEGATIVE 03/24/2023 0417   BILIRUBINUR Negative 02/27/2023 1009   KETONESUR NEGATIVE 03/24/2023 0417   PROTEINUR NEGATIVE 03/24/2023 0417   NITRITE NEGATIVE 03/24/2023 0417   LEUKOCYTESUR NEGATIVE 03/24/2023 0417   Sepsis Labs Recent Labs  Lab 03/24/23 0010 03/25/23 0410 03/26/23 0826 03/27/23 0831  WBC 12.7* 15.4* 19.3* 16.8*   Microbiology Recent Results (from the past 240 hour(s))  Blood culture (routine x 2)     Status: None (Preliminary result)   Collection Time: 03/24/23  4:26 AM   Specimen: BLOOD  Result Value Ref Range Status   Specimen Description BLOOD LEFT WRIST  Final   Special Requests   Final    BOTTLES DRAWN AEROBIC AND ANAEROBIC Blood Culture results may not be optimal due to an excessive volume of blood received in culture bottles   Culture   Final    NO GROWTH 3 DAYS Performed at [facility], [address], [place], [place] [address]    Report Status PENDING  Incomplete  Blood culture (routine x 2)     Status: None (Preliminary result)   Collection Time: 03/24/23  4:26 AM   Specimen: BLOOD  Result Value Ref Range Status   Specimen Description BLOOD LEFT FA  Final   Special Requests   Final    BOTTLES DRAWN AEROBIC AND ANAEROBIC Blood Culture results may not be optimal due to an excessive volume of blood received in culture bottles   Culture   Final    NO GROWTH 3 DAYS Performed at [facility], [address], [place], [place] [address]    Report Status PENDING  Incomplete     Time coordinating discharge: Over 30 minutes  SIGNED:   Tresa Moore, MD  [facility] 03/27/2023, 11:33 AM Pager   If 7PM-7AM, please  contact night-coverage

## 2023-03-29 LAB — CULTURE, BLOOD (ROUTINE X 2)
Culture: NO GROWTH
Culture: NO GROWTH

## 2023-03-30 ENCOUNTER — Ambulatory Visit: Payer: BC Managed Care – PPO | Admitting: Family Medicine

## 2023-03-30 ENCOUNTER — Encounter: Payer: Self-pay | Admitting: Family Medicine

## 2023-03-30 ENCOUNTER — Telehealth: Payer: Self-pay

## 2023-03-30 VITALS — BP 132/79 | HR 87 | Ht 72.0 in | Wt 301.2 lb

## 2023-03-30 DIAGNOSIS — L03115 Cellulitis of right lower limb: Secondary | ICD-10-CM | POA: Diagnosis not present

## 2023-03-30 NOTE — Progress Notes (Signed)
BP 132/79   Pulse 87   Ht 6' (1.829 m)   Wt (!) 301 lb 3.2 oz (136.6 kg)   SpO2 100%   BMI 40.85 kg/m    Subjective:    Patient ID: Cody Burke, male    DOB: 1965-09-03, 57 y.o.   MRN: [id number]  HPI: Cody Burke is a 57 y.o. male  Chief Complaint  Patient presents with   Cellulitis    Patient has still noticed some redness in the area. Patient says there is no pain just soreness, and wife says patient still has tightness in his leg. Patient is currently on antibiotic. Patient is scheduled for a follow up with Podiatry on Wednesday.    Transition of Care Hospital Follow up.   Hospital/Facility: [facility] D/C Physician: Dr. Georgeann Oppenheim D/C Date: 03/27/23  Records Requested: 03/30/23 Records Received: 03/30/23 Records Reviewed: 03/30/23  Diagnoses on Discharge: Cellulitis in diabetic foot (HCC)   Hypertension   Hyperlipidemia   Type 2 diabetes mellitus (HCC)  Date of interactive Contact within 48 hours of discharge: 03/30/23 Contact was through: phone  Date of 7 day or 14 day face-to-face visit: 03/30/23  within 7 days  Outpatient Encounter Medications as of 03/30/2023  Medication Sig   [EXPIRED] amoxicillin-clavulanate (AUGMENTIN) 875-125 MG tablet Take 1 tablet by mouth 2 (two) times daily for 7 days.   aspirin EC 81 MG tablet Take 81 mg by mouth daily.   atorvastatin (LIPITOR) 20 MG tablet Take 1 tablet (20 mg total) by mouth daily.   benazepril (LOTENSIN) 40 MG tablet Take 1 tablet (40 mg total) by mouth daily.   dapagliflozin propanediol (FARXIGA) 10 MG TABS tablet Take 10 mg by mouth daily.   glucose blood test strip Please dispense insurance preference. Use to check BS BID   metFORMIN (GLUCOPHAGE-XR) 500 MG 24 hr tablet Take 2 tablets (1,000 mg total) by mouth 2 (two) times daily.   Multiple Vitamins-Minerals (MULTIVITAMIN GUMMIES ADULT PO) Take by mouth daily.   Omega-3 Fatty Acids (FISH OIL) 1000 MG CAPS Take 1,000 mg by mouth daily.   sildenafil (VIAGRA) 100 MG  tablet Take 1 tab 1 hour prior to intercourse   silodosin (RAPAFLO) 8 MG CAPS capsule TAKE 1 CAPSULE BY MOUTH DAILY WITH BREAKFAST.   [DISCONTINUED] HYDROcodone-acetaminophen (NORCO/VICODIN) 5-325 MG tablet Take 1-2 tablets by mouth every 4 (four) hours as needed for severe pain (pain score 7-10).   No facility-administered encounter medications on file as of 03/30/2023.  Per Hospitalist: " Cellulitis in diabetic foot (HCC) Worsening right lower extremity cellulitis in the setting of baseline chronic right diabetic foot wound followed by podiatry White count 12.7-->14-->18-->16 Afebrile, but did endorse fevers and chills at home Plan: Okay for discharge home.  De-escalate to Augmentin.  Additional 7 days prescribed for total 10-day course.  Unna boot in place.  Defer to podiatry regarding care recommendations.  Patient can discharge home.  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  Complicates overall care and prognosis"  Diagnostic Tests Reviewed  Disposition: Home with home health  Consults:  None  Discharge Instructions:  Follow up with PCP in 1-2 weeks Follow-up with podiatry Wednesday 11/6  Disease/illness Education: Discussed today  Home Health/Community Services Discussions/Referrals: In place  Establishment or re-establishment of referral orders for community resources: In place  Discussion with other health care providers: N/A  Assessment and Support of treatment regimen adherence: Good  Appointments Coordinated with: Patient and wife  Education for self-management, independent living, and ADLs: Discussed today  Feeling better. Has not had any fevers since he got out of  the hospital. He notes that the redness is improving. He has not had pain. He has no other concerns or complaints at this time.   Relevant past medical, surgical, family and social history reviewed and updated as indicated. Interim medical history since our last visit reviewed. Allergies and medications reviewed and updated.  Review of Systems  Constitutional: Negative.   Respiratory: Negative.    Cardiovascular: Negative.   Gastrointestinal: Negative.   Musculoskeletal: Negative.   Skin:  Positive for color change. Negative for pallor, rash and wound.  Psychiatric/Behavioral: Negative.      Per HPI unless specifically indicated above     Objective:    BP 132/79   Pulse 87   Ht 6' (1.829 m)   Wt (!) 301 lb 3.2 oz (136.6 kg)   SpO2 100%   BMI 40.85 kg/m   Wt Readings from Last 3 Encounters:  04/03/23 (!) 302 lb (137 kg)  03/30/23 (!) 301 lb 3.2 oz (136.6 kg)  03/27/23 (!) 313 lb 0.9 oz (142 kg)    Physical Exam Vitals and nursing note reviewed.  Constitutional:      General: He is not in acute distress.    Appearance: Normal appearance. He is not ill-appearing, toxic-appearing or diaphoretic.  HENT:     Head: Normocephalic and atraumatic.     Right Ear: External ear normal.     Left Ear: External ear normal.     Nose: Nose normal.     Mouth/Throat:     Mouth: Mucous membranes are moist.     Pharynx: Oropharynx is clear.  Eyes:     General: No scleral icterus.       Right eye: No discharge.        Left eye: No discharge.     Extraocular Movements: Extraocular movements intact.     Conjunctiva/sclera: Conjunctivae normal.     Pupils: Pupils are equal, round, and reactive to light.  Cardiovascular:     Rate and Rhythm: Normal rate and regular rhythm.     Pulses: Normal pulses.     Heart sounds: Normal heart sounds. No murmur heard.    No friction rub. No gallop.  Pulmonary:     Effort: Pulmonary effort is normal. No respiratory distress.     Breath sounds:  Normal breath sounds. No stridor. No wheezing, rhonchi or rales.  Chest:     Chest wall: No tenderness.  Musculoskeletal:        General: Normal range of motion.     Cervical back: Normal range of motion and neck supple.     Comments: R leg in wrap, no heat  Skin:    General: Skin is warm and dry.     Capillary Refill: Capillary refill takes less than 2 seconds.     Coloration: Skin is not jaundiced or pale.     Findings: No bruising, erythema, lesion or rash.  Neurological:     General: No focal deficit present.     Mental Status: He is alert and oriented to person, place, and time. Mental status is at baseline.  Psychiatric:  Mood and Affect: Mood normal.        Behavior: Behavior normal.        Thought Content: Thought content normal.        Judgment: Judgment normal.     Results for orders placed or performed in visit on 03/30/23  CBC with Differential/Platelet  Result Value Ref Range   WBC 14.0 (H) 3.4 - 10.8 x10E3/uL   RBC 5.38 4.14 - 5.80 x10E6/uL   Hemoglobin 16.1 13.0 - 17.7 g/dL   Hematocrit 40.1 02.7 - 51.0 %   MCV 90 79 - 97 fL   MCH 29.9 26.6 - 33.0 pg   MCHC 33.2 31.5 - 35.7 g/dL   RDW 25.3 66.4 - 40.3 %   Platelets 423 150 - 450 x10E3/uL   Neutrophils 59 Not Estab. %   Lymphs 29 Not Estab. %   Monocytes 5 Not Estab. %   Eos 2 Not Estab. %   Basos 1 Not Estab. %   Neutrophils Absolute 8.3 (H) 1.4 - 7.0 x10E3/uL   Lymphocytes Absolute 4.0 (H) 0.7 - 3.1 x10E3/uL   Monocytes Absolute 0.7 0.1 - 0.9 x10E3/uL   EOS (ABSOLUTE) 0.3 0.0 - 0.4 x10E3/uL   Basophils Absolute 0.2 0.0 - 0.2 x10E3/uL   Immature Granulocytes 4 Not Estab. %   Immature Grans (Abs) 0.5 (H) 0.0 - 0.1 x10E3/uL  Basic metabolic panel  Result Value Ref Range   Glucose 204 (H) 70 - 99 mg/dL   BUN 13 6 - 24 mg/dL   Creatinine, Ser 4.74 0.76 - 1.27 mg/dL   eGFR 96 >25 ZD/GLO/7.56   BUN/Creatinine Ratio 14 9 - 20   Sodium 135 134 - 144 mmol/L   Potassium 5.4 (H) 3.5 - 5.2 mmol/L    Chloride 94 (L) 96 - 106 mmol/L   CO2 23 20 - 29 mmol/L   Calcium 9.5 8.7 - 10.2 mg/dL      Assessment & Plan:   Problem List Items Addressed This Visit   None Visit Diagnoses     Cellulitis of right lower extremity    -  Primary   Improving. Continue to follow with podiatry. Will recheck labs. Call if not getting better or getting worse.   Relevant Orders   CBC with Differential/Platelet (Completed)   Basic metabolic panel (Completed)        Follow up plan: Return in about 1 week (around 04/06/2023).

## 2023-03-30 NOTE — Transitions of Care (Post Inpatient/ED Visit) (Signed)
03/30/2023  Name: Cody Burke MRN: [id number] DOB: Nov 03, 1965  Today's TOC FU Call Status: Today's TOC FU Call Status:: Successful TOC FU Call Completed TOC FU Call Complete Date: 03/30/23 Patient's Name and Date of Birth confirmed.  Transition Care Management Follow-up Telephone Call Date of Discharge: 03/27/23 Discharge Facility: [facility]) Type of Discharge: Inpatient Admission Primary Inpatient Discharge Diagnosis:: Cellulitis RLL How have you been since you were released from the hospital?: Better Any questions or concerns?: No  Items Reviewed: Did you receive and understand the discharge instructions provided?: Yes Medications obtained,verified, and reconciled?: Yes (Medications Reviewed) (Started his ABT) Dietary orders reviewed?: Yes Type of Diet Ordered:: Carb modified, Heaet Healthy, nAS Do you have support at home?: Yes People in Home: spouse Name of Support/Comfort Primary Source: Cody Burke  Medications Reviewed Today: Medications Reviewed Today     Reviewed by Cody Barrios, RN (Registered Nurse) on 03/30/23 at 1248  Med List Status: <None>   Medication Order Taking? Sig Documenting Provider Last Dose Status Informant  amoxicillin-clavulanate (AUGMENTIN) 875-125 MG tablet [id number] Yes Take 1 tablet by mouth 2 (two) times daily for 7 days. Cody Moore, MD Taking Active   aspirin EC 81 MG tablet [id number] Yes Take 81 mg by mouth daily. [provider] Taking Active Spouse/Significant Other  atorvastatin (LIPITOR) 20 MG tablet [id number] Yes Take 1 tablet (20 mg total) by mouth daily. Cody Burke P, DO Taking Active Spouse/Significant Other  benazepril (LOTENSIN) 40 MG tablet [id number] Yes Take 1 tablet (40 mg total) by mouth daily. Cody Burke P, DO Taking Active Spouse/Significant Other  dapagliflozin propanediol (FARXIGA) 10 MG TABS tablet [id number] Yes Take 10 mg by mouth daily. Cody Burke P, DO Taking  Active Spouse/Significant Other  glucose blood test strip [id number] Yes Please dispense insurance preference. Use to check BS BID Cody Dials T, NP Taking Active Spouse/Significant Other  HYDROcodone-acetaminophen (NORCO/VICODIN) 5-325 MG tablet [id number]  Take 1-2 tablets by mouth every 4 (four) hours as needed for severe pain (pain score 7-10). Cody Moore, MD  Active   metFORMIN (GLUCOPHAGE-XR) 500 MG 24 hr tablet [id number] Yes Take 2 tablets (1,000 mg total) by mouth 2 (two) times daily. Cody Carrow, DO Taking Active Spouse/Significant Other  Multiple Vitamins-Minerals (MULTIVITAMIN GUMMIES ADULT PO) [id number] Yes Take by mouth daily. [provider] Taking Active Spouse/Significant Other  Omega-3 Fatty Acids (FISH OIL) 1000 MG CAPS [id number] Yes Take 1,000 mg by mouth daily. [provider] Taking Active Spouse/Significant Other  sildenafil (VIAGRA) 100 MG tablet [id number] Yes Take 1 tab 1 hour prior to intercourse Cody Burke, Cody Czech, MD Taking Active Spouse/Significant Other  silodosin (RAPAFLO) 8 MG CAPS capsule [id number] Yes TAKE 1 CAPSULE BY MOUTH DAILY WITH BREAKFAST. Cody Altes, MD Taking Active Spouse/Significant Other            Home Care and Equipment/Supplies: Were Home Health Services Ordered?: No Any new equipment or medical supplies ordered?: No  Functional Questionnaire: Do you need assistance with bathing/showering or dressing?: No Do you need assistance with meal preparation?: No Do you need assistance with eating?: No Do you have difficulty maintaining continence: No Do you need assistance with getting out of bed/getting out of a chair/moving?: No Do you have difficulty managing or taking your medications?: No  Follow up appointments reviewed: PCP Follow-up appointment confirmed?: Yes Date of PCP follow-up appointment?: 03/30/23 Follow-up Provider: Olevia Burke Specialist [facility] Follow-up appointment confirmed?: Yes Date  of Specialist follow-up appointment?:  04/01/23 Follow-Up Specialty Provider:: Cody Burke 11/8, Podiatry 11/6 Do you need transportation to your follow-up appointment?: No (He drives but his spouse will transport) Do you understand care options if your condition(s) worsen?: Yes-patient verbalized understanding  SDOH Interventions Today    Flowsheet Row Most Recent Value  SDOH Interventions   Food Insecurity Interventions Intervention Not Indicated  Housing Interventions Intervention Not Indicated  Transportation Interventions Intervention Not Indicated, Patient Resources (Friends/Family)  Utilities Interventions Intervention Not Indicated      He is doing well s/Burke discharge. He has chronic wound to right foot and developed cellulitis to RLE. He does daily wound care, is currently on oral ABT for cellulitis. Has appt w/ Podiatry 11/6 . Neither he or his wife had questions or concerns r/Burke recent hospital stay or discharge instructions.   Discussed [facility]  TOC program and weekly calls to patient to assess condition/status, medication management  and provide support/education as indicated . Patient/ Caregiver voiced understanding and declined enrollment in the 30-day TOC Program.    The patient has been provided with contact information for the care management team and has been advised to call with any health related questions or concerns.    Cody Burke , BSN, RN Care Management Coordinator [place]   [facility] [email or web address] Direct Dial: [phone]

## 2023-03-31 LAB — BASIC METABOLIC PANEL
BUN/Creatinine Ratio: 14 (ref 9–20)
BUN: 13 mg/dL (ref 6–24)
CO2: 23 mmol/L (ref 20–29)
Calcium: 9.5 mg/dL (ref 8.7–10.2)
Chloride: 94 mmol/L — ABNORMAL LOW (ref 96–106)
Creatinine, Ser: 0.92 mg/dL (ref 0.76–1.27)
Glucose: 204 mg/dL — ABNORMAL HIGH (ref 70–99)
Potassium: 5.4 mmol/L — ABNORMAL HIGH (ref 3.5–5.2)
Sodium: 135 mmol/L (ref 134–144)
eGFR: 96 mL/min/{1.73_m2} (ref >59)

## 2023-03-31 LAB — CBC WITH DIFFERENTIAL/PLATELET
Basophils Absolute: 0.2 10*3/uL (ref 0.0–0.2)
Basos: 1 %
EOS (ABSOLUTE): 0.3 10*3/uL (ref 0.0–0.4)
Eos: 2 %
Hematocrit: 48.5 % (ref 37.5–51.0)
Hemoglobin: 16.1 g/dL (ref 13.0–17.7)
Immature Grans (Abs): 0.5 10*3/uL — ABNORMAL HIGH (ref 0.0–0.1)
Immature Granulocytes: 4 %
Lymphocytes Absolute: 4 10*3/uL — ABNORMAL HIGH (ref 0.7–3.1)
Lymphs: 29 %
MCH: 29.9 pg (ref 26.6–33.0)
MCHC: 33.2 g/dL (ref 31.5–35.7)
MCV: 90 fL (ref 79–97)
Monocytes Absolute: 0.7 10*3/uL (ref 0.1–0.9)
Monocytes: 5 %
Neutrophils Absolute: 8.3 10*3/uL — ABNORMAL HIGH (ref 1.4–7.0)
Neutrophils: 59 %
Platelets: 423 10*3/uL (ref 150–450)
RBC: 5.38 x10E6/uL (ref 4.14–5.80)
RDW: 13.1 % (ref 11.6–15.4)
WBC: 14 10*3/uL — ABNORMAL HIGH (ref 3.4–10.8)

## 2023-04-01 DIAGNOSIS — E1142 Type 2 diabetes mellitus with diabetic polyneuropathy: Secondary | ICD-10-CM | POA: Diagnosis not present

## 2023-04-01 DIAGNOSIS — L97512 Non-pressure chronic ulcer of other part of right foot with fat layer exposed: Secondary | ICD-10-CM | POA: Diagnosis not present

## 2023-04-01 DIAGNOSIS — L03115 Cellulitis of right lower limb: Secondary | ICD-10-CM | POA: Diagnosis not present

## 2023-04-01 DIAGNOSIS — Z89411 Acquired absence of right great toe: Secondary | ICD-10-CM | POA: Diagnosis not present

## 2023-04-02 ENCOUNTER — Ambulatory Visit: Payer: BC Managed Care – PPO | Admitting: Urology

## 2023-04-02 ENCOUNTER — Telehealth: Payer: Self-pay | Admitting: Family Medicine

## 2023-04-02 NOTE — Telephone Encounter (Signed)
Copied from CRM [id number]. Topic: General - Other >> Apr 02, 2023 10:27 AM Turkey B wrote: Reason for CRM: miriam rivadeneira , ncm from [employer] called in about following pts care. She is requesting a copy of pt's most recent labs for ,a1c /lipid panel,/albumin. Fx #[phone]. Please use ref [phone]

## 2023-04-02 NOTE — Telephone Encounter (Signed)
Requested documents have been faxed back over to provided fax contact information.

## 2023-04-03 ENCOUNTER — Other Ambulatory Visit: Payer: Self-pay | Admitting: Family Medicine

## 2023-04-03 ENCOUNTER — Encounter: Payer: Self-pay | Admitting: Urology

## 2023-04-03 ENCOUNTER — Ambulatory Visit: Payer: BC Managed Care – PPO | Admitting: Urology

## 2023-04-03 VITALS — BP 122/79 | HR 94 | Ht 72.0 in | Wt 302.0 lb

## 2023-04-03 DIAGNOSIS — N401 Enlarged prostate with lower urinary tract symptoms: Secondary | ICD-10-CM | POA: Diagnosis not present

## 2023-04-03 DIAGNOSIS — R3915 Urgency of urination: Secondary | ICD-10-CM | POA: Diagnosis not present

## 2023-04-03 DIAGNOSIS — D72829 Elevated white blood cell count, unspecified: Secondary | ICD-10-CM

## 2023-04-03 LAB — BLADDER SCAN AMB NON-IMAGING: Scan Result: 0

## 2023-04-03 MED ORDER — GEMTESA 75 MG PO TABS: 75.0000 mg | ORAL_TABLET | Freq: Every day | ORAL | Status: DC

## 2023-04-03 NOTE — Progress Notes (Signed)
I, Maysun Anabel Bene, acting as a scribe for Riki Altes, MD., have documented all relevant documentation on the behalf of Riki Altes, MD, as directed by Riki Altes, MD while in the presence of Riki Altes, MD.  04/03/2023 2:33 PM   Cody Burke 12/04/1965 [id number]  Referring provider: Dorcas Carrow, DO [address] [place],  [place] [address]  Chief Complaint  Patient presents with   Benign Prostatic Hypertrophy   Urologic history: 1. BPH with LUTS Silodosin   2. Erectile dysfunction Sildenafil   HPI: Cody Burke is a 57 y.o. male presents for a follow-up visit.   Initially seen 02/27/2023 with bothersome lower urinary tract symptoms and incomplete emptying with the residual of 121 mL. He had been on tamsulosin in the past which was not effective, and was given a trial of silidosin.  He does feel his voiding symptoms have improved on silidosin, though he is still having urinary urgency with occasional episodes of urge incontinence.  Recently hospitalized for cellulitis of the right lower extremity.   PMH: Past Medical History:  Diagnosis Date   Arthritis    Diabetes mellitus without complication (HCC)    Hyperlipidemia    Hypertension    Obesity    Peripheral vascular disease (HCC)    Sleep apnea    severe - new DX - has not gotten CPAP yet    Surgical History: Past Surgical History:  Procedure Laterality Date   AMPUTATION Right 05/09/2022   Procedure: AMPUTATION FIRST RAY;  Surgeon: Rosetta Posner, DPM;  Location: [facility];  Service: Podiatry;  Laterality: Right;   ARTHRODESIS METATARSALPHALANGEAL JOINT (MTPJ) Right 03/07/2022   Procedure: 29562 - Richarda Blade;  Surgeon: Gwyneth Revels, DPM;  Location: [facility];  Service: Podiatry;  Laterality: Right;   COLONOSCOPY WITH PROPOFOL N/A 09/21/2015   Procedure: COLONOSCOPY WITH PROPOFOL;  Surgeon: Midge Minium, MD;  Location: [facility];  Service: Endoscopy;  Laterality: N/A;   Diabetic - oral meds sleep apnea   SESMOIDECTOMY Right 03/07/2022   Procedure: SESMOIDECTOMY;  Surgeon: Gwyneth Revels, DPM;  Location: [facility];  Service: Podiatry;  Laterality: Right;   VASCULAR SURGERY Bilateral    legs   WOUND DEBRIDEMENT Right 03/07/2022   Procedure: DEBRIDEMENT OF RIGHT FOOT ULCER;  Surgeon: Gwyneth Revels, DPM;  Location: [facility];  Service: Podiatry;  Laterality: Right;    Home Medications:  Allergies as of 04/03/2023   No Known Allergies      Medication List        Accurate as of April 03, 2023  2:33 PM. If you have any questions, ask your nurse or doctor.          amoxicillin-clavulanate 875-125 MG tablet Commonly known as: AUGMENTIN Take 1 tablet by mouth 2 (two) times daily for 7 days.   aspirin EC 81 MG tablet Take 81 mg by mouth daily.   atorvastatin 20 MG tablet Commonly known as: LIPITOR Take 1 tablet (20 mg total) by mouth daily.   benazepril 40 MG tablet Commonly known as: LOTENSIN Take 1 tablet (40 mg total) by mouth daily.   dapagliflozin propanediol 10 MG Tabs tablet Commonly known as: Farxiga Take 10 mg by mouth daily.   Fish Oil 1000 MG Caps Take 1,000 mg by mouth daily.   Gemtesa 75 MG Tabs Generic drug: Vibegron Take 1 tablet (75 mg total) by mouth daily. Started by: Riki Altes   glucose blood test strip Please dispense  insurance preference. Use to check BS BID   metFORMIN 500 MG 24 hr tablet Commonly known as: GLUCOPHAGE-XR Take 2 tablets (1,000 mg total) by mouth 2 (two) times daily.   MULTIVITAMIN GUMMIES ADULT PO Take by mouth daily.   sildenafil 100 MG tablet Commonly known as: VIAGRA Take 1 tab 1 hour prior to intercourse   silodosin 8 MG Caps capsule Commonly known as: RAPAFLO TAKE 1 CAPSULE BY MOUTH DAILY WITH BREAKFAST.        Allergies: No Known Allergies  Family History: Family History  Problem Relation Age of Onset   Cancer Mother    Hypertension Father     Social History:   reports that he quit smoking about 19 years ago. His smoking use included cigarettes. He has never used smokeless tobacco. He reports current alcohol use. He reports that he does not use drugs.   Physical Exam: BP 122/79   Pulse 94   Ht 6' (1.829 m)   Wt (!) 302 lb (137 kg)   BMI 40.96 kg/m   Constitutional:  Alert and oriented, No acute distress. HEENT: [place] AT, moist mucus membranes.  Trachea midline, no masses. Cardiovascular: No clubbing, cyanosis, or edema. Respiratory: Normal respiratory effort, no increased work of breathing. GI: Abdomen is soft, nontender, nondistended, no abdominal masses Skin: No rashes, bruises or suspicious lesions. Neurologic: Grossly intact, no focal deficits, moving all 4 extremities. Psychiatric: Normal mood and affect.   Assessment & Plan:    1. BPH with LUTS Symptom improvement on silidosin and PVR today was 0 mL.  Still having urgency with occasional episodes of urge incontinence.  Continues silidosin.  Add Gemtesa 75 mg daily. Samples were given and he will call back regarding efficacy  6 month follow-up and call earlier for worsening voiding symptoms  [facility] [address] [place], [place] [address] [phone]

## 2023-04-05 ENCOUNTER — Encounter: Payer: Self-pay | Admitting: Family Medicine

## 2023-04-06 ENCOUNTER — Other Ambulatory Visit: Payer: BC Managed Care – PPO

## 2023-04-06 DIAGNOSIS — D72829 Elevated white blood cell count, unspecified: Secondary | ICD-10-CM

## 2023-04-06 NOTE — Addendum Note (Signed)
Addended by: Dorcas Carrow on: 04/06/2023 09:37 AM   Modules accepted: Level of Service

## 2023-04-07 LAB — CBC WITH DIFFERENTIAL/PLATELET
Basophils Absolute: 0.1 10*3/uL (ref 0.0–0.2)
Basos: 1 %
EOS (ABSOLUTE): 0.3 10*3/uL (ref 0.0–0.4)
Eos: 3 %
Hematocrit: 47.3 % (ref 37.5–51.0)
Hemoglobin: 15.2 g/dL (ref 13.0–17.7)
Immature Grans (Abs): 0.1 10*3/uL (ref 0.0–0.1)
Immature Granulocytes: 1 %
Lymphocytes Absolute: 4.3 10*3/uL — ABNORMAL HIGH (ref 0.7–3.1)
Lymphs: 44 %
MCH: 30 pg (ref 26.6–33.0)
MCHC: 32.1 g/dL (ref 31.5–35.7)
MCV: 93 fL (ref 79–97)
Monocytes Absolute: 0.8 10*3/uL (ref 0.1–0.9)
Monocytes: 8 %
Neutrophils Absolute: 4.4 10*3/uL (ref 1.4–7.0)
Neutrophils: 43 %
Platelets: 431 10*3/uL (ref 150–450)
RBC: 5.07 x10E6/uL (ref 4.14–5.80)
RDW: 13.3 % (ref 11.6–15.4)
WBC: 10.1 10*3/uL (ref 3.4–10.8)

## 2023-04-08 DIAGNOSIS — E1142 Type 2 diabetes mellitus with diabetic polyneuropathy: Secondary | ICD-10-CM | POA: Diagnosis not present

## 2023-04-08 DIAGNOSIS — Z89411 Acquired absence of right great toe: Secondary | ICD-10-CM | POA: Diagnosis not present

## 2023-04-08 DIAGNOSIS — L97512 Non-pressure chronic ulcer of other part of right foot with fat layer exposed: Secondary | ICD-10-CM | POA: Diagnosis not present

## 2023-04-09 DIAGNOSIS — L97512 Non-pressure chronic ulcer of other part of right foot with fat layer exposed: Secondary | ICD-10-CM | POA: Diagnosis not present

## 2023-04-15 DIAGNOSIS — Z89411 Acquired absence of right great toe: Secondary | ICD-10-CM | POA: Diagnosis not present

## 2023-04-15 DIAGNOSIS — E1142 Type 2 diabetes mellitus with diabetic polyneuropathy: Secondary | ICD-10-CM | POA: Diagnosis not present

## 2023-04-15 DIAGNOSIS — L97512 Non-pressure chronic ulcer of other part of right foot with fat layer exposed: Secondary | ICD-10-CM | POA: Diagnosis not present

## 2023-04-15 DIAGNOSIS — I739 Peripheral vascular disease, unspecified: Secondary | ICD-10-CM | POA: Diagnosis not present

## 2023-04-17 ENCOUNTER — Encounter: Payer: BC Managed Care – PPO | Admitting: Family Medicine

## 2023-04-21 ENCOUNTER — Ambulatory Visit (INDEPENDENT_AMBULATORY_CARE_PROVIDER_SITE_OTHER): Payer: BC Managed Care – PPO | Admitting: Family Medicine

## 2023-04-21 ENCOUNTER — Encounter: Payer: Self-pay | Admitting: Family Medicine

## 2023-04-21 VITALS — BP 125/79 | HR 73 | Temp 97.9°F | Resp 13 | Ht 72.6 in | Wt 301.2 lb

## 2023-04-21 DIAGNOSIS — Z23 Encounter for immunization: Secondary | ICD-10-CM | POA: Diagnosis not present

## 2023-04-21 DIAGNOSIS — Z0000 Encounter for general adult medical examination without abnormal findings: Secondary | ICD-10-CM | POA: Diagnosis not present

## 2023-04-21 DIAGNOSIS — E1169 Type 2 diabetes mellitus with other specified complication: Secondary | ICD-10-CM | POA: Diagnosis not present

## 2023-04-21 DIAGNOSIS — E7800 Pure hypercholesterolemia, unspecified: Secondary | ICD-10-CM | POA: Diagnosis not present

## 2023-04-21 DIAGNOSIS — Z7984 Long term (current) use of oral hypoglycemic drugs: Secondary | ICD-10-CM

## 2023-04-21 DIAGNOSIS — I739 Peripheral vascular disease, unspecified: Secondary | ICD-10-CM | POA: Diagnosis not present

## 2023-04-21 DIAGNOSIS — I10 Essential (primary) hypertension: Secondary | ICD-10-CM | POA: Diagnosis not present

## 2023-04-21 LAB — MICROALBUMIN, URINE WAIVED
Creatinine, Urine Waived: 200 mg/dL (ref 10–300)
Microalb, Ur Waived: 30 mg/L — ABNORMAL HIGH (ref 0–19)
Microalb/Creat Ratio: <30 mg/g (ref <30)

## 2023-04-21 LAB — BAYER DCA HB A1C WAIVED: HB A1C (BAYER DCA - WAIVED): 7.7 % — ABNORMAL HIGH (ref 4.8–5.6)

## 2023-04-21 MED ORDER — METFORMIN HCL ER 500 MG PO TB24: 1000.0000 mg | ORAL_TABLET | Freq: Two times a day (BID) | ORAL | 1 refills | Status: DC

## 2023-04-21 MED ORDER — BENAZEPRIL HCL 40 MG PO TABS: 40.0000 mg | ORAL_TABLET | Freq: Every day | ORAL | 1 refills | Status: DC

## 2023-04-21 MED ORDER — DAPAGLIFLOZIN PROPANEDIOL 10 MG PO TABS: ORAL_TABLET | ORAL | 1 refills | Status: DC

## 2023-04-21 MED ORDER — ATORVASTATIN CALCIUM 20 MG PO TABS: 20.0000 mg | ORAL_TABLET | Freq: Every day | ORAL | 1 refills | Status: DC

## 2023-04-21 NOTE — Progress Notes (Signed)
BP 125/79 (BP Location: Left Arm, Patient Position: Sitting, Cuff Size: Large)   Pulse 73   Temp 97.9 F (36.6 C) (Oral)   Resp 13   Ht 6' 0.6" (1.844 m)   Wt (!) 301 lb 3.2 oz (136.6 kg)   SpO2 97%   BMI 40.18 kg/m    Subjective:    Patient ID: Cody Burke, male    DOB: 02-Sep-1965, 57 y.o.   MRN: [id number]  HPI: Daryle Baily is a 57 y.o. male presenting on 04/21/2023 for comprehensive medical examination. Current medical complaints include:  DIABETES Hypoglycemic episodes:no Polydipsia/polyuria: no Visual disturbance: no Chest pain: no Paresthesias: yes Glucose Monitoring: yes  Accucheck frequency: Daily- sitting in the 130s Taking Insulin?: no Blood Pressure Monitoring: not checking Retinal Examination: Up to Date Foot Exam: Up to Date Diabetic Education: Completed Pneumovax: Up to Date Influenza: Up to Date Aspirin: no  HYPERTENSION / HYPERLIPIDEMIA Satisfied with current treatment? yes Duration of hypertension: chronic BP monitoring frequency: not checking BP medication side effects: no Past BP meds: benazepril Duration of hyperlipidemia: chronic Cholesterol medication side effects: no Cholesterol supplements: none Past cholesterol medications: atorvastatin Medication compliance: excellent compliance Aspirin: yes Recent stressors: no Recurrent headaches: no Visual changes: no Palpitations: no Dyspnea: no Chest pain: no Lower extremity edema: no Dizzy/lightheaded: no  He currently lives with: wife Interim Problems from his last visit: no  Depression Screen done today and results listed below:     04/21/2023    9:24 AM 01/09/2023    8:18 AM 10/07/2022    3:57 PM 07/08/2022    3:46 PM 05/16/2022    9:41 AM  Depression screen PHQ 2/9  Decreased Interest 0 0 0 0 0  Down, Depressed, Hopeless 0 0 0 0 0  PHQ - 2 Score 0 0 0 0 0  Altered sleeping 0 0 0 0 0  Tired, decreased energy 0 0 0 0 0  Change in appetite 0 0 0 0 0  Feeling bad or  failure about yourself  0 0 0 0 0  Trouble concentrating 0 0 0 0 0  Moving slowly or fidgety/restless 0 0 0 0 0  Suicidal thoughts 0 0 0 0 0  PHQ-9 Score 0 0 0 0 0  Difficult doing work/chores Not difficult at all Not difficult at all Not difficult at all Not difficult at all Not difficult at all    Past Medical History:  Past Medical History:  Diagnosis Date   Arthritis    Diabetes mellitus without complication (HCC)    Hyperlipidemia    Hypertension    Obesity    Peripheral vascular disease (HCC)    Sleep apnea    severe - new DX - has not gotten CPAP yet    Surgical History:  Past Surgical History:  Procedure Laterality Date   AMPUTATION Right 05/09/2022   Procedure: AMPUTATION FIRST RAY;  Surgeon: Rosetta Posner, DPM;  Location: [facility];  Service: Podiatry;  Laterality: Right;   ARTHRODESIS METATARSALPHALANGEAL JOINT (MTPJ) Right 03/07/2022   Procedure: 40981 - Richarda Blade;  Surgeon: Gwyneth Revels, DPM;  Location: [facility];  Service: Podiatry;  Laterality: Right;   COLONOSCOPY WITH PROPOFOL N/A 09/21/2015   Procedure: COLONOSCOPY WITH PROPOFOL;  Surgeon: Midge Minium, MD;  Location: [facility];  Service: Endoscopy;  Laterality: N/A;  Diabetic - oral meds sleep apnea   SESMOIDECTOMY Right 03/07/2022   Procedure: SESMOIDECTOMY;  Surgeon: Gwyneth Revels, DPM;  Location: [facility];  Service: Podiatry;  Laterality: Right;   VASCULAR SURGERY Bilateral    legs   WOUND DEBRIDEMENT Right 03/07/2022   Procedure: DEBRIDEMENT OF RIGHT FOOT ULCER;  Surgeon: Gwyneth Revels, DPM;  Location: [facility];  Service: Podiatry;  Laterality: Right;    Medications:  Current Outpatient Medications on File Prior to Visit  Medication Sig   aspirin EC 81 MG tablet Take 81 mg by mouth daily.   glucose blood test strip Please dispense insurance preference. Use to check BS BID   Multiple Vitamins-Minerals (MULTIVITAMIN GUMMIES ADULT PO) Take by mouth daily.   Omega-3 Fatty Acids  (FISH OIL) 1000 MG CAPS Take 1,000 mg by mouth daily.   sildenafil (VIAGRA) 100 MG tablet Take 1 tab 1 hour prior to intercourse   silodosin (RAPAFLO) 8 MG CAPS capsule TAKE 1 CAPSULE BY MOUTH DAILY WITH BREAKFAST.   No current facility-administered medications on file prior to visit.    Allergies:  No Known Allergies  Social History:  Social History   Socioeconomic History   Marital status: Married    Spouse name: Merdis Delay   Number of children: 2   Years of education: Not on file   Highest education level: Associate degree: occupational, Scientist, product/process development, or vocational program  Occupational History   Not on file  Tobacco Use   Smoking status: Former    Current packs/day: 0.00    Types: Cigarettes    Quit date: 07/30/2003    Years since quitting: 19.7   Smokeless tobacco: Never  Vaping Use   Vaping status: Never Used  Substance and Sexual Activity   Alcohol use: Yes    Comment: very little   Drug use: No   Sexual activity: Not Currently  Other Topics Concern   Not on file  Social History Narrative   Not on file   Social Determinants of Health   Financial Resource Strain: Low Risk  (03/23/2023)   Overall Financial Resource Strain (CARDIA)    Difficulty of Paying Living Expenses: Not hard at all  Food Insecurity: No Food Insecurity (03/30/2023)   Hunger Vital Sign    Worried About Running Out of Food in the Last Year: Never true    Ran Out of Food in the Last Year: Never true  Transportation Needs: No Transportation Needs (03/30/2023)   PRAPARE - Administrator, Civil Service (Medical): No    Lack of Transportation (Non-Medical): No  Physical Activity: Insufficiently Active (03/23/2023)   Exercise Vital Sign    Days of Exercise per Week: 1 day    Minutes of Exercise per Session: 10 min  Stress: No Stress Concern Present (03/23/2023)   [employer] of Occupational Health - Occupational Stress Questionnaire    Feeling of Stress : Only a little  Social  Connections: Socially Integrated (03/23/2023)   Social Connection and Isolation Panel [NHANES]    Frequency of Communication with Friends and Family: More than three times a week    Frequency of Social Gatherings with Friends and Family: Three times a week    Attends Religious Services: More than 4 times per year    Active Member of Clubs or Organizations: Yes    Attends Banker Meetings: 1 to 4 times per year    Marital Status: Married  Catering manager Violence: Not At Risk (03/30/2023)   Humiliation, Afraid, Rape, and Kick questionnaire    Fear of Current or Ex-Partner: No    Emotionally Abused: No    Physically Abused: No    Sexually Abused:  No   Social History   Tobacco Use  Smoking Status Former   Current packs/day: 0.00   Types: Cigarettes   Quit date: 07/30/2003   Years since quitting: 19.7  Smokeless Tobacco Never   Social History   Substance and Sexual Activity  Alcohol Use Yes   Comment: very little    Family History:  Family History  Problem Relation Age of Onset   Cancer Mother    Hypertension Father     Past medical history, surgical history, medications, allergies, family history and social history reviewed with patient today and changes made to appropriate areas of the chart.   Review of Systems  Constitutional: Negative.   HENT: Negative.    Eyes: Negative.   Respiratory: Negative.    Cardiovascular:  Positive for leg swelling. Negative for chest pain, palpitations, orthopnea, claudication and PND.  Gastrointestinal: Negative.   Genitourinary: Negative.   Musculoskeletal: Negative.   Skin: Negative.    All other ROS negative except what is listed above and in the HPI.      Objective:    BP 125/79 (BP Location: Left Arm, Patient Position: Sitting, Cuff Size: Large)   Pulse 73   Temp 97.9 F (36.6 C) (Oral)   Resp 13   Ht 6' 0.6" (1.844 m)   Wt (!) 301 lb 3.2 oz (136.6 kg)   SpO2 97%   BMI 40.18 kg/m   Wt Readings from Last 3  Encounters:  04/21/23 (!) 301 lb 3.2 oz (136.6 kg)  04/03/23 (!) 302 lb (137 kg)  03/30/23 (!) 301 lb 3.2 oz (136.6 kg)    Physical Exam Vitals and nursing note reviewed.  Constitutional:      General: He is not in acute distress.    Appearance: Normal appearance. He is obese. He is not ill-appearing, toxic-appearing or diaphoretic.  HENT:     Head: Normocephalic and atraumatic.     Right Ear: Tympanic membrane, ear canal and external ear normal. There is no impacted cerumen.     Left Ear: Tympanic membrane, ear canal and external ear normal. There is no impacted cerumen.     Nose: Nose normal. No congestion or rhinorrhea.     Mouth/Throat:     Mouth: Mucous membranes are moist.     Pharynx: Oropharynx is clear. No oropharyngeal exudate or posterior oropharyngeal erythema.  Eyes:     General: No scleral icterus.       Right eye: No discharge.        Left eye: No discharge.     Extraocular Movements: Extraocular movements intact.     Conjunctiva/sclera: Conjunctivae normal.     Pupils: Pupils are equal, round, and reactive to light.  Neck:     Vascular: No carotid bruit.  Cardiovascular:     Rate and Rhythm: Normal rate and regular rhythm.     Pulses: Normal pulses.     Heart sounds: No murmur heard.    No friction rub. No gallop.  Pulmonary:     Effort: Pulmonary effort is normal. No respiratory distress.     Breath sounds: Normal breath sounds. No stridor. No wheezing, rhonchi or rales.  Chest:     Chest wall: No tenderness.  Abdominal:     General: Abdomen is flat. Bowel sounds are normal. There is no distension.     Palpations: Abdomen is soft. There is no mass.     Tenderness: There is no abdominal tenderness. There is no right CVA tenderness, left CVA tenderness,  guarding or rebound.     Hernia: No hernia is present.  Genitourinary:    Comments: Genital exam deferred with shared decision making Musculoskeletal:        General: No swelling, tenderness, deformity or  signs of injury.     Cervical back: Normal range of motion and neck supple. No rigidity. No muscular tenderness.     Right lower leg: No edema.     Left lower leg: No edema.     Comments: R foot in boot  Lymphadenopathy:     Cervical: No cervical adenopathy.  Skin:    General: Skin is warm and dry.     Capillary Refill: Capillary refill takes less than 2 seconds.     Coloration: Skin is not jaundiced or pale.     Findings: No bruising, erythema, lesion or rash.  Neurological:     General: No focal deficit present.     Mental Status: He is alert and oriented to person, place, and time.     Cranial Nerves: No cranial nerve deficit.     Sensory: No sensory deficit.     Motor: No weakness.     Coordination: Coordination normal.     Gait: Gait normal.     Deep Tendon Reflexes: Reflexes normal.  Psychiatric:        Mood and Affect: Mood normal.        Behavior: Behavior normal.        Thought Content: Thought content normal.        Judgment: Judgment normal.     Results for orders placed or performed in visit on 04/06/23  CBC with Differential/Platelet  Result Value Ref Range   WBC 10.1 3.4 - 10.8 x10E3/uL   RBC 5.07 4.14 - 5.80 x10E6/uL   Hemoglobin 15.2 13.0 - 17.7 g/dL   Hematocrit 46.9 62.9 - 51.0 %   MCV 93 79 - 97 fL   MCH 30.0 26.6 - 33.0 pg   MCHC 32.1 31.5 - 35.7 g/dL   RDW 52.8 41.3 - 24.4 %   Platelets 431 150 - 450 x10E3/uL   Neutrophils 43 Not Estab. %   Lymphs 44 Not Estab. %   Monocytes 8 Not Estab. %   Eos 3 Not Estab. %   Basos 1 Not Estab. %   Neutrophils Absolute 4.4 1.4 - 7.0 x10E3/uL   Lymphocytes Absolute 4.3 (H) 0.7 - 3.1 x10E3/uL   Monocytes Absolute 0.8 0.1 - 0.9 x10E3/uL   EOS (ABSOLUTE) 0.3 0.0 - 0.4 x10E3/uL   Basophils Absolute 0.1 0.0 - 0.2 x10E3/uL   Immature Granulocytes 1 Not Estab. %   Immature Grans (Abs) 0.1 0.0 - 0.1 x10E3/uL      Assessment & Plan:   Problem List Items Addressed This Visit       Cardiovascular and Mediastinum    Hypertension    Under good control on current regimen. Continue current regimen. Continue to monitor. Call with any concerns. Refills given. Labs drawn today.       Relevant Medications   atorvastatin (LIPITOR) 20 MG tablet   benazepril (LOTENSIN) 40 MG tablet   PVD (peripheral vascular disease) (HCC)    Will keep BP, sugar and cholesterol under good control. Continue to monitor. Call with any concerns.       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 Will continue current regimen and really watch diet. Recheck 3 months. Call with any concerns.       Relevant Medications   atorvastatin (LIPITOR) 20 MG tablet   benazepril (LOTENSIN) 40 MG tablet   dapagliflozin propanediol (FARXIGA) 10 MG TABS tablet   metFORMIN (GLUCOPHAGE-XR) 500 MG 24 hr tablet   Other Relevant Orders   Microalbumin, Urine Waived   Bayer DCA Hb A1c Waived     Other   Hyperlipidemia    Under good control on current regimen. Continue current regimen. Continue to monitor. Call with any concerns. Refills given. Labs drawn today.        Relevant Medications   atorvastatin (LIPITOR) 20 MG tablet   benazepril (LOTENSIN) 40 MG tablet   Other Visit Diagnoses     Routine general medical examination at a health care facility    -  Primary   Vaccines up to date. Screening labs checked today. Colonoscopy up to date. Continue diet and exercise. Call with any concerns.   Relevant Orders   Comprehensive metabolic panel   CBC with Differential/Platelet   Lipid Panel w/o Chol/HDL Ratio   PSA   TSH   Need for vaccination       Flu shot given today.   Relevant Orders   Flu vaccine trivalent PF, 6mos and older(Flulaval,Afluria,Fluarix,Fluzone) (Completed)        LABORATORY TESTING:  Health maintenance labs ordered today as discussed above.   The natural  history of prostate cancer and ongoing controversy regarding screening and potential treatment outcomes of prostate cancer has been discussed with the patient. The meaning of a false positive PSA and a false negative PSA has been discussed. He indicates understanding of the limitations of this screening test and wishes to proceed with screening PSA testing.   IMMUNIZATIONS:   - Tdap: Tetanus vaccination status reviewed: last tetanus booster within 10 years. - Influenza: Administered today - Pneumovax: Up to date - Prevnar: Not applicable - COVID: Refused - HPV: Not applicable - Shingrix vaccine: Refused  SCREENING: - Colonoscopy: Up to date  Discussed with patient purpose of the colonoscopy is to detect colon cancer at curable precancerous or early stages   PATIENT COUNSELING:    Sexuality: Discussed sexually transmitted diseases, partner selection, use of condoms, avoidance of unintended pregnancy  and contraceptive alternatives.   Advised to avoid cigarette smoking.  I discussed with the patient that most people either abstain from alcohol or drink within safe limits (<=14/week and <=4 drinks/occasion for males, <=7/weeks and <= 3 drinks/occasion for females) and that the risk for alcohol disorders and other health effects rises proportionally with the number of drinks per week and how often a drinker exceeds daily limits.  Discussed cessation/primary prevention of drug use and availability of treatment for abuse.   Diet: Encouraged to adjust caloric intake to maintain  or achieve ideal body weight, to reduce intake of dietary saturated fat and total fat, to limit sodium intake by avoiding high sodium foods and not adding table salt, and to maintain adequate dietary potassium and calcium preferably from fresh fruits, vegetables, and low-fat dairy products.    stressed the importance of regular exercise  Injury prevention: Discussed safety belts, safety helmets, smoke detector, smoking  near bedding or upholstery.   Dental health: Discussed importance of regular tooth brushing, flossing, and dental visits.   Follow up plan: NEXT PREVENTATIVE PHYSICAL DUE IN 1 YEAR. Return in about 3 months (around 07/22/2023).

## 2023-04-21 NOTE — Assessment & Plan Note (Signed)
Under good control on current regimen. Continue current regimen. Continue to monitor. Call with any concerns. Refills given. Labs drawn today.   

## 2023-04-21 NOTE — Assessment & Plan Note (Signed)
Stable with A1c of 7.7. Will continue current regimen and really watch diet. Recheck 3 months. Call with any concerns.

## 2023-04-21 NOTE — Assessment & Plan Note (Signed)
Will keep BP, sugar and cholesterol under good control. Continue to monitor. Call with any concerns.

## 2023-04-22 LAB — LIPID PANEL W/O CHOL/HDL RATIO
Cholesterol, Total: 130 mg/dL (ref 100–199)
HDL: 40 mg/dL (ref >39)
LDL Chol Calc (NIH): 51 mg/dL (ref 0–99)
Triglycerides: 249 mg/dL — ABNORMAL HIGH (ref 0–149)
VLDL Cholesterol Cal: 39 mg/dL (ref 5–40)

## 2023-04-22 LAB — CBC WITH DIFFERENTIAL/PLATELET
Basophils Absolute: 0.1 10*3/uL (ref 0.0–0.2)
Basos: 1 %
EOS (ABSOLUTE): 0.5 10*3/uL — ABNORMAL HIGH (ref 0.0–0.4)
Eos: 5 %
Hematocrit: 47.5 % (ref 37.5–51.0)
Hemoglobin: 15.6 g/dL (ref 13.0–17.7)
Immature Grans (Abs): 0.1 10*3/uL (ref 0.0–0.1)
Immature Granulocytes: 1 %
Lymphocytes Absolute: 4.7 10*3/uL — ABNORMAL HIGH (ref 0.7–3.1)
Lymphs: 51 %
MCH: 30.1 pg (ref 26.6–33.0)
MCHC: 32.8 g/dL (ref 31.5–35.7)
MCV: 92 fL (ref 79–97)
Monocytes Absolute: 0.7 10*3/uL (ref 0.1–0.9)
Monocytes: 8 %
Neutrophils Absolute: 3.2 10*3/uL (ref 1.4–7.0)
Neutrophils: 34 %
Platelets: 250 10*3/uL (ref 150–450)
RBC: 5.18 x10E6/uL (ref 4.14–5.80)
RDW: 13.5 % (ref 11.6–15.4)
WBC: 9.4 10*3/uL (ref 3.4–10.8)

## 2023-04-22 LAB — COMPREHENSIVE METABOLIC PANEL
ALT: 30 [IU]/L (ref 0–44)
AST: 24 [IU]/L (ref 0–40)
Albumin: 3.9 g/dL (ref 3.8–4.9)
Alkaline Phosphatase: 89 [IU]/L (ref 44–121)
BUN/Creatinine Ratio: 19 (ref 9–20)
BUN: 17 mg/dL (ref 6–24)
Bilirubin Total: 0.4 mg/dL (ref 0.0–1.2)
CO2: 20 mmol/L (ref 20–29)
Calcium: 8.9 mg/dL (ref 8.7–10.2)
Chloride: 100 mmol/L (ref 96–106)
Creatinine, Ser: 0.91 mg/dL (ref 0.76–1.27)
Globulin, Total: 3.2 g/dL (ref 1.5–4.5)
Glucose: 146 mg/dL — ABNORMAL HIGH (ref 70–99)
Potassium: 4.6 mmol/L (ref 3.5–5.2)
Sodium: 137 mmol/L (ref 134–144)
Total Protein: 7.1 g/dL (ref 6.0–8.5)
eGFR: 97 mL/min/{1.73_m2} (ref >59)

## 2023-04-22 LAB — PSA: Prostate Specific Ag, Serum: <0.1 ng/mL (ref 0.0–4.0)

## 2023-04-22 LAB — TSH: TSH: 2.44 u[IU]/mL (ref 0.450–4.500)

## 2023-05-01 DIAGNOSIS — E1142 Type 2 diabetes mellitus with diabetic polyneuropathy: Secondary | ICD-10-CM | POA: Diagnosis not present

## 2023-05-01 DIAGNOSIS — L909 Atrophic disorder of skin, unspecified: Secondary | ICD-10-CM | POA: Diagnosis not present

## 2023-05-01 DIAGNOSIS — L97512 Non-pressure chronic ulcer of other part of right foot with fat layer exposed: Secondary | ICD-10-CM | POA: Diagnosis not present

## 2023-05-01 DIAGNOSIS — M216X1 Other acquired deformities of right foot: Secondary | ICD-10-CM | POA: Diagnosis not present

## 2023-05-01 DIAGNOSIS — M2041 Other hammer toe(s) (acquired), right foot: Secondary | ICD-10-CM | POA: Diagnosis not present

## 2023-05-12 NOTE — Telephone Encounter (Signed)
Results abstracted and Care Team updated

## 2023-05-13 NOTE — Telephone Encounter (Signed)
[facility] nurse case manager is calling to f/u on the requested copy of most recent A1C , hemoglobin lipid panel.   She stated she never received the fax asking for it to please be resent.  Please advise.   Fax - [phone] Ref - [phone]

## 2023-05-14 NOTE — Telephone Encounter (Signed)
Requested orders were printed and faxed to the fax information provided in previous telephone encounter per request.

## 2023-05-15 DIAGNOSIS — I739 Peripheral vascular disease, unspecified: Secondary | ICD-10-CM | POA: Diagnosis not present

## 2023-05-15 DIAGNOSIS — E1142 Type 2 diabetes mellitus with diabetic polyneuropathy: Secondary | ICD-10-CM | POA: Diagnosis not present

## 2023-05-15 DIAGNOSIS — Z89411 Acquired absence of right great toe: Secondary | ICD-10-CM | POA: Diagnosis not present

## 2023-05-15 DIAGNOSIS — L97512 Non-pressure chronic ulcer of other part of right foot with fat layer exposed: Secondary | ICD-10-CM | POA: Diagnosis not present

## 2023-05-29 DIAGNOSIS — L97512 Non-pressure chronic ulcer of other part of right foot with fat layer exposed: Secondary | ICD-10-CM | POA: Diagnosis not present

## 2023-05-29 DIAGNOSIS — E1142 Type 2 diabetes mellitus with diabetic polyneuropathy: Secondary | ICD-10-CM | POA: Diagnosis not present

## 2023-06-15 DIAGNOSIS — Z89411 Acquired absence of right great toe: Secondary | ICD-10-CM | POA: Diagnosis not present

## 2023-06-15 DIAGNOSIS — L97512 Non-pressure chronic ulcer of other part of right foot with fat layer exposed: Secondary | ICD-10-CM | POA: Diagnosis not present

## 2023-06-15 DIAGNOSIS — E1142 Type 2 diabetes mellitus with diabetic polyneuropathy: Secondary | ICD-10-CM | POA: Diagnosis not present

## 2023-07-03 ENCOUNTER — Other Ambulatory Visit: Payer: Self-pay | Admitting: Podiatry

## 2023-07-03 DIAGNOSIS — L97512 Non-pressure chronic ulcer of other part of right foot with fat layer exposed: Secondary | ICD-10-CM | POA: Diagnosis not present

## 2023-07-03 DIAGNOSIS — E1142 Type 2 diabetes mellitus with diabetic polyneuropathy: Secondary | ICD-10-CM | POA: Diagnosis not present

## 2023-07-03 DIAGNOSIS — Z89411 Acquired absence of right great toe: Secondary | ICD-10-CM | POA: Diagnosis not present

## 2023-07-03 DIAGNOSIS — L909 Atrophic disorder of skin, unspecified: Secondary | ICD-10-CM | POA: Diagnosis not present

## 2023-07-06 ENCOUNTER — Ambulatory Visit: Payer: PRIVATE HEALTH INSURANCE | Admitting: Nurse Practitioner

## 2023-07-07 ENCOUNTER — Encounter: Payer: Self-pay | Admitting: Pediatrics

## 2023-07-07 ENCOUNTER — Encounter
Admission: RE | Admit: 2023-07-07 | Discharge: 2023-07-07 | Disposition: A | Discharge status: Home / Self Care | Payer: BC Managed Care – PPO | Source: Ambulatory Visit | Attending: Podiatry | Admitting: Podiatry

## 2023-07-07 ENCOUNTER — Encounter
Admission: RE | Admit: 2023-07-07 | Discharge: 2023-07-07 | Disposition: A | Discharge status: Home / Self Care | Payer: PRIVATE HEALTH INSURANCE | Source: Ambulatory Visit | Attending: Podiatry | Admitting: Podiatry

## 2023-07-07 ENCOUNTER — Other Ambulatory Visit: Payer: Self-pay

## 2023-07-07 ENCOUNTER — Ambulatory Visit: Payer: PRIVATE HEALTH INSURANCE | Admitting: Pediatrics

## 2023-07-07 VITALS — BP 138/82 | HR 69 | Temp 98.6°F | Resp 15 | Ht 72.6 in | Wt 299.8 lb

## 2023-07-07 VITALS — Ht 72.6 in | Wt 299.0 lb

## 2023-07-07 DIAGNOSIS — Z01818 Encounter for other preprocedural examination: Secondary | ICD-10-CM | POA: Diagnosis not present

## 2023-07-07 DIAGNOSIS — E1142 Type 2 diabetes mellitus with diabetic polyneuropathy: Secondary | ICD-10-CM | POA: Diagnosis not present

## 2023-07-07 DIAGNOSIS — Z01812 Encounter for preprocedural laboratory examination: Secondary | ICD-10-CM

## 2023-07-07 DIAGNOSIS — Z7984 Long term (current) use of oral hypoglycemic drugs: Secondary | ICD-10-CM

## 2023-07-07 DIAGNOSIS — Z01810 Encounter for preprocedural cardiovascular examination: Secondary | ICD-10-CM | POA: Diagnosis not present

## 2023-07-07 DIAGNOSIS — Z87891 Personal history of nicotine dependence: Secondary | ICD-10-CM | POA: Diagnosis not present

## 2023-07-07 DIAGNOSIS — I10 Essential (primary) hypertension: Secondary | ICD-10-CM | POA: Insufficient documentation

## 2023-07-07 DIAGNOSIS — E7849 Other hyperlipidemia: Secondary | ICD-10-CM

## 2023-07-07 DIAGNOSIS — E1169 Type 2 diabetes mellitus with other specified complication: Secondary | ICD-10-CM | POA: Insufficient documentation

## 2023-07-07 DIAGNOSIS — M2041 Other hammer toe(s) (acquired), right foot: Secondary | ICD-10-CM | POA: Diagnosis not present

## 2023-07-07 DIAGNOSIS — Z79899 Other long term (current) drug therapy: Secondary | ICD-10-CM | POA: Diagnosis not present

## 2023-07-07 DIAGNOSIS — M869 Osteomyelitis, unspecified: Secondary | ICD-10-CM

## 2023-07-07 DIAGNOSIS — E11621 Type 2 diabetes mellitus with foot ulcer: Secondary | ICD-10-CM | POA: Diagnosis not present

## 2023-07-07 DIAGNOSIS — Z1330 Encounter for screening examination for mental health and behavioral disorders, unspecified: Secondary | ICD-10-CM | POA: Diagnosis not present

## 2023-07-07 DIAGNOSIS — M216X1 Other acquired deformities of right foot: Secondary | ICD-10-CM | POA: Diagnosis not present

## 2023-07-07 DIAGNOSIS — L97519 Non-pressure chronic ulcer of other part of right foot with unspecified severity: Secondary | ICD-10-CM | POA: Diagnosis not present

## 2023-07-07 DIAGNOSIS — E1151 Type 2 diabetes mellitus with diabetic peripheral angiopathy without gangrene: Secondary | ICD-10-CM | POA: Diagnosis not present

## 2023-07-07 DIAGNOSIS — L97509 Non-pressure chronic ulcer of other part of unspecified foot with unspecified severity: Secondary | ICD-10-CM

## 2023-07-07 HISTORY — DX: Lymphedema, not elsewhere classified: I89.0

## 2023-07-07 HISTORY — DX: Male erectile dysfunction, unspecified: N52.9

## 2023-07-07 HISTORY — DX: Type 2 diabetes mellitus with other skin complications: E11.628

## 2023-07-07 HISTORY — DX: Type 2 diabetes mellitus with diabetic polyneuropathy: E11.42

## 2023-07-07 HISTORY — DX: Benign prostatic hyperplasia with lower urinary tract symptoms: N40.1

## 2023-07-07 HISTORY — DX: Other hammer toe(s) (acquired), right foot: M20.41

## 2023-07-07 NOTE — Patient Instructions (Addendum)
Please hold your lotensin the day of surgery.

## 2023-07-07 NOTE — Progress Notes (Addendum)
Office Visit  BP 138/82 (BP Location: Left Arm, Patient Position: Sitting, Cuff Size: Large)   Pulse 69   Temp 98.6 F (37 C) (Oral)   Resp 15   Ht 6' 0.6" (1.844 m)   Wt 299 lb 12.8 oz (136 kg)   SpO2 100%   BMI 39.99 kg/m    Subjective:    Patient ID: Cody Burke, male    DOB: 1965/09/15, 58 y.o.   MRN: [id number]  HPI: Cody Burke is a 58 y.o. male  Chief Complaint  Patient presents with   Medical Clearance    Surgery Thursday. Right foot     Discussed the use of AI scribe software for clinical note transcription with the patient, who gave verbal consent to proceed.  History of Present Illness   Cody Burke is a 58 year old male who presents for pre-operative medication management and review.  He is preparing for an upcoming surgical procedure and seeks guidance on medication management prior to the surgery. He stopped taking aspirin seven days before the procedure, as instructed. He also stopped fish oil pills, multivitamins, and a stool softener on Saturday. He has not taken any medications today due to uncertainty about the instructions regarding metformin and Farxiga, which were to be stopped two and three days prior, respectively. He continued taking metformin, Farxiga, benazepril, and Cervastatin until yesterday.  He recalls a previous experience where he received conflicting instructions from his doctor and the hospital regarding medication cessation before surgery. To avoid confusion, he decided to stop aspirin and supplements early this time. He has a history of surgery on his big toe, which initially healed well but later developed an infection, leading to amputation. He was on [place] during that time and was not advised to stop it before the surgery.  He has neuropathy in his feet and mentions having a bottle of hydrocodone from a previous surgery, which he did not use as he did not experience pain.  No sleep apnea or issues with anesthesia in the  past. He is concerned about potential infection post-surgery, similar to his previous experience.     Relevant past medical, surgical, family and social history reviewed and updated as indicated. Interim medical history since our last visit reviewed. Allergies and medications reviewed and updated.  ROS per HPI unless specifically indicated above     Objective:    BP 138/82 (BP Location: Left Arm, Patient Position: Sitting, Cuff Size: Large)   Pulse 69   Temp 98.6 F (37 C) (Oral)   Resp 15   Ht 6' 0.6" (1.844 m)   Wt 299 lb 12.8 oz (136 kg)   SpO2 100%   BMI 39.99 kg/m   Wt Readings from Last 3 Encounters:  07/07/23 299 lb 12.8 oz (136 kg)  07/07/23 299 lb (135.6 kg)  04/21/23 (!) 301 lb 3.2 oz (136.6 kg)     Physical Exam Constitutional:      Appearance: Normal appearance.  HENT:     Head: Normocephalic and atraumatic.  Eyes:     Pupils: Pupils are equal, round, and reactive to light.  Cardiovascular:     Rate and Rhythm: Normal rate and regular rhythm.     Pulses: Normal pulses.     Heart sounds: Normal heart sounds.  Pulmonary:     Effort: Pulmonary effort is normal.     Breath sounds: Normal breath sounds.  Musculoskeletal:        General: Normal range of motion.  Cervical back: Normal range of motion.     Comments: Right lower foot with brace/boot in place  Skin:    General: Skin is warm and dry.  Neurological:     General: No focal deficit present.     Mental Status: He is alert. Mental status is at baseline.  Psychiatric:        Mood and Affect: Mood normal.        Behavior: Behavior normal.         07/07/2023   10:27 AM 04/21/2023    9:24 AM 01/09/2023    8:18 AM 10/07/2022    3:57 PM 07/08/2022    3:46 PM  Depression screen PHQ 2/9  Decreased Interest 0 0 0 0 0  Down, Depressed, Hopeless 0 0 0 0 0  PHQ - 2 Score 0 0 0 0 0  Altered sleeping 0 0 0 0 0  Tired, decreased energy 0 0 0 0 0  Change in appetite 0 0 0 0 0  Feeling bad or failure  about yourself  0 0 0 0 0  Trouble concentrating 0 0 0 0 0  Moving slowly or fidgety/restless 0 0 0 0 0  Suicidal thoughts 0 0 0 0 0  PHQ-9 Score 0 0 0 0 0  Difficult doing work/chores Not difficult at all Not difficult at all Not difficult at all Not difficult at all Not difficult at all       07/07/2023   10:27 AM 04/21/2023    9:25 AM 01/09/2023    8:19 AM 10/07/2022    3:57 PM  GAD 7 : Generalized Anxiety Score  Nervous, Anxious, on Edge 0 0 0 0  Control/stop worrying 0 0 0 0  Worry too much - different things 0 0 0 0  Trouble relaxing 0 0 0 0  Restless 0 0 0 0  Easily annoyed or irritable 0 0 0 0  Afraid - awful might happen 0 0 0 0  Total GAD 7 Score 0 0 0 0  Anxiety Difficulty Not difficult at all Not difficult at all Not difficult at all Not difficult at all       Assessment & Plan:  Assessment & Plan   Preop examination 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 3.9% risk of major cardiac event. Low risk for cardiac complication from procedure. Recommend holding ACE day of procedure. Other medications held per recommendations from surgeon. While no consistent evidence, discussed long term SGLT-2 discontinuation given amputation and comborbid PVD (theoretical increased risk for amputation) but defer to podiatry and PCP.  - will send fax of operative clearance to podiatry office  Primary hypertension -Hold blood pressure medication on the day of the procedure.  Encounter for behavioral health screening As part of their intake evaluation, the patient was screened for depression, anxiety.  PHQ9 SCORE 0, GAD7 SCORE 0. Screening results negative for tested conditions. See plan under problem/diagnosis above.  Follow up plan: Return if symptoms worsen or fail to improve.  Constancia Geeting Howell Pringle, MD  Approximately 30 minutes spent on patient encounter today including assessment, counseling, diagnosing,  treatment plan development, and charting.

## 2023-07-07 NOTE — Patient Instructions (Addendum)
Your procedure is scheduled on: Thursday, February 13 Report to the Registration Desk on the 1st floor of the [facility] To find out your arrival time, please call [phone] between 1PM - 3PM on: Wednesday, February 12 If your arrival time is 6:00 am, do not arrive before that time as the [facility] entrance doors do not open until 6:00 am.  REMEMBER: Instructions that are not followed completely may result in serious medical risk, up to and including death; or upon the discretion of your surgeon and anesthesiologist your surgery may need to be rescheduled.  Do not eat food after midnight the night before surgery.  No gum chewing or hard candies.  You may however, drink water up to 2 hours before you are scheduled to arrive for your surgery. Do not drink anything within 2 hours of your scheduled arrival time.  In addition, your doctor has ordered for you to drink the provided:  Gatorade G2 Drinking this carbohydrate drink up to two hours before surgery helps to reduce insulin resistance and improve patient outcomes. Please complete drinking 2 hours before scheduled arrival time.  One week prior to surgery: Stop Anti-inflammatories (NSAIDS) such as Advil, Aleve, Ibuprofen, Motrin, Naproxen, Naprosyn and Aspirin based products such as Excedrin, Goody's Powder, BC Powder. Stop ANY OVER THE COUNTER supplements until after surgery. Stop multiple vitamins, fish oil.  You may however, continue to take Tylenol if needed for pain up until the day of surgery.  Metformin - hold 2 days before surgery. Last day to take is Monday, February 10. Resume AFTER surgery.  dapagliflozin propanediol (FARXIGA) - hold 3 days before surgery. Last day to take Sunday, February 9. Resume AFTER surgery.  Aspirin - per Dr. Excell Seltzer, stop 7 days before surgery.  Continue taking all of your other prescription medications up until the day of surgery.  ON THE DAY OF SURGERY ONLY TAKE THESE MEDICATIONS WITH SIPS  OF WATER:  atorvastatin (LIPITOR) silodosin (RAPAFLO)   No Alcohol for 24 hours before or after surgery.  No Smoking including e-cigarettes for 24 hours before surgery.  No chewable tobacco products for at least 6 hours before surgery.  No nicotine patches on the day of surgery.  Do not use any "recreational" drugs for at least a week (preferably 2 weeks) before your surgery.  Please be advised that the combination of cocaine and anesthesia may have negative outcomes, up to and including death. If you test positive for cocaine, your surgery will be cancelled.  On the morning of surgery brush your teeth with toothpaste and water, you may rinse your mouth with mouthwash if you wish. Do not swallow any toothpaste or mouthwash.  Use CHG Soap as directed on instruction sheet.  Do not wear jewelry, make-up, hairpins, clips or nail polish.  For welded (permanent) jewelry: bracelets, anklets, waist bands, etc.  Please have this removed prior to surgery.  If it is not removed, there is a chance that hospital personnel will need to cut it off on the day of surgery.  Do not wear lotions, powders, or perfumes.   Do not shave body hair from the neck down 48 hours before surgery.  Contact lenses, hearing aids and dentures may not be worn into surgery.  Do not bring valuables to the hospital. [facility] is not responsible for any missing/lost belongings or valuables.   Notify your doctor if there is any change in your medical condition (cold, fever, infection).  Wear comfortable clothing (specific to your surgery  type) to the hospital.  After surgery, you can help prevent lung complications by doing breathing exercises.  Take deep breaths and cough every 1-2 hours. Your doctor may order a device called an Incentive Spirometer to help you take deep breaths.  If you are being admitted to the hospital overnight, leave your suitcase in the car. After surgery it may be brought to your room.  In  case of increased patient census, it may be necessary for you, the patient, to continue your postoperative care in the Same Day Surgery department.  If you are being discharged the day of surgery, you will not be allowed to drive home. You will need a responsible individual to drive you home and stay with you for 24 hours after surgery.   If you are taking public transportation, you will need to have a responsible individual with you.  Please call the Pre-admissions Testing Dept. at [phone] if you have any questions about these instructions.  Surgery Visitation Policy:  Patients having surgery or a procedure may have two visitors.  Children under the age of 36 must have an adult with them who is not the patient.  Temporary Visitor Restrictions Due to increasing cases of flu, RSV and COVID-19: Children ages 53 and under will not be able to visit patients in [facility] hospitals under most circumstances.      Preparing for Surgery with CHLORHEXIDINE GLUCONATE (CHG) Soap  Chlorhexidine Gluconate (CHG) Soap  o An antiseptic cleaner that kills germs and bonds with the skin to continue killing germs even after washing  o Used for showering the night before surgery and morning of surgery  Before surgery, you can play an important role by reducing the number of germs on your skin.  CHG (Chlorhexidine gluconate) soap is an antiseptic cleanser which kills germs and bonds with the skin to continue killing germs even after washing.  Please do not use if you have an allergy to CHG or antibacterial soaps. If your skin becomes reddened/irritated stop using the CHG.  1. Shower the NIGHT BEFORE SURGERY and the MORNING OF SURGERY with CHG soap.  2. If you choose to wash your hair, wash your hair first as usual with your normal shampoo.  3. After shampooing, rinse your hair and body thoroughly to remove the shampoo.  4. Use CHG as you would any other liquid soap. You can apply CHG  directly to the skin and wash gently with a scrungie or a clean washcloth.  5. Apply the CHG soap to your body only from the neck down. Do not use on open wounds or open sores. Avoid contact with your eyes, ears, mouth, and genitals (private parts). Wash face and genitals (private parts) with your normal soap.  6. Wash thoroughly, paying special attention to the area where your surgery will be performed.  7. Thoroughly rinse your body with warm water.  8. Do not shower/wash with your normal soap after using and rinsing off the CHG soap.  9. Pat yourself dry with a clean towel.  10. Wear clean pajamas to bed the night before surgery.  12. Place clean sheets on your bed the night of your first shower and do not sleep with pets.  13. Shower again with the CHG soap on the day of surgery prior to arriving at the hospital.  14. Do not apply any deodorants/lotions/powders.  15. Please wear clean clothes to the hospital.

## 2023-07-08 NOTE — Discharge Instructions (Signed)
[facility]  POST OPERATIVE INSTRUCTIONS FOR DR. Ether Griffins AND DR. Jerricka Carvey [facility] PODIATRY DEPARTMENT   Take your medication as prescribed.  Pain medication should be taken only as needed.  If pain is mild to moderate and controllable would recommend usage of ibuprofen/Tylenol for relief.  Maximum dose of Tylenol per day is 4000 mg, maximum dose of ibuprofen per day is 3200 mg.  Keep the dressing clean, dry and intact.  You may apply gentle pressure to your right heel which should stay off the foot is much as possible and only ambulate for short distances with heel contact.  Putting pressure through the ball of your foot could cause increased risk for postoperative wound dehiscence and incision opening as well as slow healing.  Keep your foot elevated above the heart level for the first 48 hours.  Continue elevation thereafter to improve swelling/pain.  May also apply ice to the top of the right foot for maximum 10 minutes out of every 1 hour as needed for pain relief/swelling.  Walking to the bathroom and brief periods of walking are acceptable, unless we have instructed you to be non-weight bearing.  Always wear your post-op shoe when walking.  Always use your crutches if you are to be non-weight bearing.  Do not take a shower. Baths are permissible as long as the foot is kept out of the water.   Every hour you are awake:  Bend your knee 15 times. Flex foot 15 times Massage calf 15 times  Call [facility] [phone]) if any of the following problems occur: You develop a temperature or fever. The bandage becomes saturated with blood. Medication does not stop your pain. Injury of the foot occurs. Any symptoms of infection including redness, odor, or red streaks running from wound.

## 2023-07-09 ENCOUNTER — Ambulatory Visit
Admission: RE | Admit: 2023-07-09 | Discharge: 2023-07-09 | Disposition: A | Discharge status: Home / Self Care | Payer: BC Managed Care – PPO | Attending: Podiatry | Admitting: Podiatry

## 2023-07-09 ENCOUNTER — Ambulatory Visit: Payer: BC Managed Care – PPO | Admitting: Certified Registered"

## 2023-07-09 ENCOUNTER — Other Ambulatory Visit: Payer: Self-pay

## 2023-07-09 ENCOUNTER — Encounter
Admission: RE | Disposition: A | Discharge status: Home / Self Care | Payer: Self-pay | Source: Home / Self Care | Attending: Podiatry

## 2023-07-09 ENCOUNTER — Ambulatory Visit: Payer: BC Managed Care – PPO

## 2023-07-09 ENCOUNTER — Encounter: Payer: Self-pay | Admitting: Podiatry

## 2023-07-09 DIAGNOSIS — E1142 Type 2 diabetes mellitus with diabetic polyneuropathy: Secondary | ICD-10-CM | POA: Diagnosis not present

## 2023-07-09 DIAGNOSIS — Z87891 Personal history of nicotine dependence: Secondary | ICD-10-CM | POA: Diagnosis not present

## 2023-07-09 DIAGNOSIS — L97519 Non-pressure chronic ulcer of other part of right foot with unspecified severity: Secondary | ICD-10-CM | POA: Insufficient documentation

## 2023-07-09 DIAGNOSIS — Z79899 Other long term (current) drug therapy: Secondary | ICD-10-CM | POA: Diagnosis not present

## 2023-07-09 DIAGNOSIS — I10 Essential (primary) hypertension: Secondary | ICD-10-CM | POA: Diagnosis not present

## 2023-07-09 DIAGNOSIS — L97512 Non-pressure chronic ulcer of other part of right foot with fat layer exposed: Secondary | ICD-10-CM | POA: Diagnosis not present

## 2023-07-09 DIAGNOSIS — Z7984 Long term (current) use of oral hypoglycemic drugs: Secondary | ICD-10-CM | POA: Insufficient documentation

## 2023-07-09 DIAGNOSIS — E1151 Type 2 diabetes mellitus with diabetic peripheral angiopathy without gangrene: Secondary | ICD-10-CM | POA: Diagnosis not present

## 2023-07-09 DIAGNOSIS — M2041 Other hammer toe(s) (acquired), right foot: Secondary | ICD-10-CM | POA: Insufficient documentation

## 2023-07-09 DIAGNOSIS — M216X1 Other acquired deformities of right foot: Secondary | ICD-10-CM | POA: Diagnosis not present

## 2023-07-09 DIAGNOSIS — E11621 Type 2 diabetes mellitus with foot ulcer: Secondary | ICD-10-CM | POA: Insufficient documentation

## 2023-07-09 DIAGNOSIS — Z01812 Encounter for preprocedural laboratory examination: Secondary | ICD-10-CM

## 2023-07-09 DIAGNOSIS — M205X1 Other deformities of toe(s) (acquired), right foot: Secondary | ICD-10-CM | POA: Diagnosis not present

## 2023-07-09 DIAGNOSIS — L97513 Non-pressure chronic ulcer of other part of right foot with necrosis of muscle: Secondary | ICD-10-CM | POA: Diagnosis not present

## 2023-07-09 DIAGNOSIS — E1169 Type 2 diabetes mellitus with other specified complication: Secondary | ICD-10-CM

## 2023-07-09 DIAGNOSIS — M899 Disorder of bone, unspecified: Secondary | ICD-10-CM | POA: Diagnosis not present

## 2023-07-09 HISTORY — PX: METATARSAL HEAD EXCISION: SHX5027

## 2023-07-09 HISTORY — PX: WOUND DEBRIDEMENT: SHX247

## 2023-07-09 LAB — GLUCOSE, CAPILLARY
Glucose-Capillary: 169 mg/dL — ABNORMAL HIGH (ref 70–99)
Glucose-Capillary: 159 mg/dL — ABNORMAL HIGH (ref 70–99)

## 2023-07-09 SURGERY — EXCISION, METATARSAL BONE, HEAD
Anesthesia: General | Site: Toe | Laterality: Right

## 2023-07-09 MED ORDER — KETOROLAC TROMETHAMINE 30 MG/ML IJ SOLN
INTRAMUSCULAR | Status: AC
  Filled 2023-07-09: qty 1

## 2023-07-09 MED ORDER — LIDOCAINE-EPINEPHRINE 1 %-1:100000 IJ SOLN
INTRAMUSCULAR | Status: AC
  Filled 2023-07-09: qty 1

## 2023-07-09 MED ORDER — ORAL CARE MOUTH RINSE: 15.0000 mL | Freq: Once | OROMUCOSAL | Status: AC

## 2023-07-09 MED ORDER — BUPIVACAINE HCL (PF) 0.5 % IJ SOLN
INTRAMUSCULAR | Status: DC | PRN
  Administered 2023-07-09: 20 mL

## 2023-07-09 MED ORDER — ONDANSETRON HCL 4 MG/2ML IJ SOLN
INTRAMUSCULAR | Status: AC
  Filled 2023-07-09: qty 2

## 2023-07-09 MED ORDER — BUPIVACAINE HCL (PF) 0.5 % IJ SOLN
INTRAMUSCULAR | Status: AC
  Filled 2023-07-09: qty 30

## 2023-07-09 MED ORDER — KETOROLAC TROMETHAMINE 30 MG/ML IJ SOLN
INTRAMUSCULAR | Status: DC | PRN
  Administered 2023-07-09: 30 mg via INTRAVENOUS

## 2023-07-09 MED ORDER — CEFAZOLIN SODIUM-DEXTROSE 2-4 GM/100ML-% IV SOLN
INTRAVENOUS | Status: AC
  Filled 2023-07-09: qty 100

## 2023-07-09 MED ORDER — LIDOCAINE HCL (PF) 1 % IJ SOLN
INTRAMUSCULAR | Status: AC
  Filled 2023-07-09: qty 30

## 2023-07-09 MED ORDER — AMOXICILLIN-POT CLAVULANATE 875-125 MG PO TABS: 1.0000 | ORAL_TABLET | Freq: Two times a day (BID) | ORAL | 0 refills | Status: AC

## 2023-07-09 MED ORDER — LIDOCAINE HCL (PF) 2 % IJ SOLN
INTRAMUSCULAR | Status: AC
  Filled 2023-07-09: qty 5

## 2023-07-09 MED ORDER — DEXAMETHASONE SODIUM PHOSPHATE 10 MG/ML IJ SOLN
INTRAMUSCULAR | Status: DC | PRN
  Administered 2023-07-09: 5 mg via INTRAVENOUS

## 2023-07-09 MED ORDER — ONDANSETRON HCL 4 MG/2ML IJ SOLN
INTRAMUSCULAR | Status: DC | PRN
  Administered 2023-07-09: 4 mg via INTRAVENOUS

## 2023-07-09 MED ORDER — CHLORHEXIDINE GLUCONATE 0.12 % MT SOLN
15.0000 mL | Freq: Once | OROMUCOSAL | Status: AC
  Administered 2023-07-09: 15 mL via OROMUCOSAL

## 2023-07-09 MED ORDER — 0.9 % SODIUM CHLORIDE (POUR BTL) OPTIME
TOPICAL | Status: DC | PRN
  Administered 2023-07-09: 500 mL

## 2023-07-09 MED ORDER — FENTANYL CITRATE (PF) 100 MCG/2ML IJ SOLN
INTRAMUSCULAR | Status: AC
  Filled 2023-07-09: qty 2

## 2023-07-09 MED ORDER — ROCURONIUM BROMIDE 10 MG/ML (PF) SYRINGE
PREFILLED_SYRINGE | INTRAVENOUS | Status: AC
  Filled 2023-07-09: qty 10

## 2023-07-09 MED ORDER — MIDAZOLAM HCL 2 MG/2ML IJ SOLN
INTRAMUSCULAR | Status: DC | PRN
  Administered 2023-07-09: 2 mg via INTRAVENOUS

## 2023-07-09 MED ORDER — FENTANYL CITRATE (PF) 100 MCG/2ML IJ SOLN
INTRAMUSCULAR | Status: DC | PRN
  Administered 2023-07-09: 25 ug via INTRAVENOUS
  Administered 2023-07-09: 50 ug via INTRAVENOUS
  Administered 2023-07-09: 25 ug via INTRAVENOUS

## 2023-07-09 MED ORDER — SODIUM CHLORIDE 0.9 % IV SOLN: INTRAVENOUS | Status: DC

## 2023-07-09 MED ORDER — CHLORHEXIDINE GLUCONATE 0.12 % MT SOLN
OROMUCOSAL | Status: AC
  Filled 2023-07-09: qty 15

## 2023-07-09 MED ORDER — DEXAMETHASONE SODIUM PHOSPHATE 10 MG/ML IJ SOLN
INTRAMUSCULAR | Status: AC
  Filled 2023-07-09: qty 1

## 2023-07-09 MED ORDER — ONDANSETRON HCL 4 MG PO TABS: 4.0000 mg | ORAL_TABLET | Freq: Three times a day (TID) | ORAL | 0 refills | Status: AC | PRN

## 2023-07-09 MED ORDER — BUPIVACAINE HCL (PF) 0.25 % IJ SOLN
INTRAMUSCULAR | Status: AC
  Filled 2023-07-09: qty 30

## 2023-07-09 MED ORDER — PROPOFOL 10 MG/ML IV BOLUS
INTRAVENOUS | Status: AC
  Filled 2023-07-09: qty 20

## 2023-07-09 MED ORDER — PROPOFOL 10 MG/ML IV BOLUS
INTRAVENOUS | Status: DC | PRN
  Administered 2023-07-09: 200 mg via INTRAVENOUS

## 2023-07-09 MED ORDER — MIDAZOLAM HCL 2 MG/2ML IJ SOLN
INTRAMUSCULAR | Status: AC
  Filled 2023-07-09: qty 2

## 2023-07-09 MED ORDER — ACETAMINOPHEN 10 MG/ML IV SOLN
INTRAVENOUS | Status: DC | PRN
  Administered 2023-07-09: 1000 mg via INTRAVENOUS

## 2023-07-09 MED ORDER — LIDOCAINE HCL (CARDIAC) PF 100 MG/5ML IV SOSY
PREFILLED_SYRINGE | INTRAVENOUS | Status: DC | PRN
  Administered 2023-07-09: 100 mg via INTRAVENOUS

## 2023-07-09 MED ORDER — HYDROCODONE-ACETAMINOPHEN 5-325 MG PO TABS: 1.0000 | ORAL_TABLET | Freq: Four times a day (QID) | ORAL | 0 refills | Status: AC | PRN

## 2023-07-09 MED ORDER — CEFAZOLIN SODIUM-DEXTROSE 3-4 GM/150ML-% IV SOLN
3.0000 g | INTRAVENOUS | Status: AC
  Administered 2023-07-09: 3 g via INTRAVENOUS
  Filled 2023-07-09: qty 150

## 2023-07-09 MED ORDER — ACETAMINOPHEN 10 MG/ML IV SOLN
INTRAVENOUS | Status: AC
  Filled 2023-07-09: qty 100

## 2023-07-09 SURGICAL SUPPLY — 65 items
BAG COUNTER SPONGE SURGICOUNT (BAG) ×1 IMPLANT
BENZOIN TINCTURE PRP APPL 2/3 (GAUZE/BANDAGES/DRESSINGS) ×1 IMPLANT
BLADE MED AGGRESSIVE (BLADE) IMPLANT
BLADE OSC/SAGITTAL MD 5.5X18 (BLADE) IMPLANT
BLADE SURG 15 STRL LF DISP TIS (BLADE) ×1 IMPLANT
BLADE SW THK.38XMED LNG THN (BLADE) IMPLANT
BNDG COHESIVE 4X5 TAN STRL LF (GAUZE/BANDAGES/DRESSINGS) ×1 IMPLANT
BNDG ELASTIC 3X5.8 VLCR NS LF (GAUZE/BANDAGES/DRESSINGS) IMPLANT
BNDG ELASTIC 4X5.8 VLCR NS LF (GAUZE/BANDAGES/DRESSINGS) IMPLANT
BNDG ESMARCH 4X12 STRL LF (GAUZE/BANDAGES/DRESSINGS) ×1 IMPLANT
BNDG GAUZE DERMACEA FLUFF 4 (GAUZE/BANDAGES/DRESSINGS) ×1 IMPLANT
BNDG STRETCH 4X75 STRL LF (GAUZE/BANDAGES/DRESSINGS) ×1 IMPLANT
BNDG STRETCH GAUZE 3IN X12FT (GAUZE/BANDAGES/DRESSINGS) IMPLANT
COVER LIGHT HANDLE STERIS (MISCELLANEOUS) ×2 IMPLANT
CUFF TOURN SGL QUICK 18X4 (TOURNIQUET CUFF) IMPLANT
DRAPE FLUOR MINI C-ARM 54X84 (DRAPES) ×1 IMPLANT
DURAPREP 26ML APPLICATOR (WOUND CARE) ×1 IMPLANT
ELECT REM PT RETURN 9FT ADLT (ELECTROSURGICAL) ×1
ELECTRODE REM PT RTRN 9FT ADLT (ELECTROSURGICAL) ×1 IMPLANT
GAUZE PACKING 0.25INX5YD STRL (GAUZE/BANDAGES/DRESSINGS) IMPLANT
GAUZE SPONGE 4X4 12PLY STRL (GAUZE/BANDAGES/DRESSINGS) ×1 IMPLANT
GAUZE STRETCH 2X75IN STRL (MISCELLANEOUS) IMPLANT
GAUZE XEROFORM 1X8 LF (GAUZE/BANDAGES/DRESSINGS) ×1 IMPLANT
GLOVE BIO SURGEON STRL SZ7 (GLOVE) ×1 IMPLANT
GLOVE BIOGEL PI IND STRL 7.5 (GLOVE) ×1 IMPLANT
GLOVE INDICATOR 7.5 STRL GRN (GLOVE) ×1 IMPLANT
GOWN STRL REUS W/ TWL LRG LVL3 (GOWN DISPOSABLE) ×2 IMPLANT
HEMOSTAT SURGICEL 2X3 (HEMOSTASIS) IMPLANT
IV NS 1000ML BAXH (IV SOLUTION) IMPLANT
IV NS IRRIG 3000ML ARTHROMATIC (IV SOLUTION) IMPLANT
K-WIRE DBL END TROCAR 6X.045 (WIRE)
K-WIRE DBL END TROCAR 6X.062 (WIRE)
KIT TURNOVER KIT A (KITS) ×1 IMPLANT
KWIRE DBL END TROCAR 6X.045 (WIRE) IMPLANT
KWIRE DBL END TROCAR 6X.062 (WIRE) IMPLANT
LABEL OR SOLS (LABEL) IMPLANT
MANIFOLD NEPTUNE II (INSTRUMENTS) ×1 IMPLANT
NDL HYPO 25X1 1.5 SAFETY (NEEDLE) ×2 IMPLANT
NEEDLE HYPO 25X1 1.5 SAFETY (NEEDLE) ×2
NS IRRIG 500ML POUR BTL (IV SOLUTION) ×1 IMPLANT
PACK EXTREMITY ARMC (MISCELLANEOUS) ×1 IMPLANT
PACKING GAUZE IODOFORM 1INX5YD (GAUZE/BANDAGES/DRESSINGS) IMPLANT
PAD ABD DERMACEA PRESS 5X9 (GAUZE/BANDAGES/DRESSINGS) IMPLANT
PENCIL SMOKE EVACUATOR (MISCELLANEOUS) ×1 IMPLANT
PIN BALLS 3/8 F/.045 WIRE (MISCELLANEOUS) ×1 IMPLANT
PULSAVAC PLUS IRRIG FAN TIP (DISPOSABLE)
RASP SM TEAR CROSS CUT (RASP) IMPLANT
SOL PREP PVP 2OZ (MISCELLANEOUS)
SOLUTION PREP PVP 2OZ (MISCELLANEOUS) IMPLANT
STOCKINETTE IMPERVIOUS 9X36 MD (GAUZE/BANDAGES/DRESSINGS) ×1 IMPLANT
STOCKINETTE STRL 6IN 960660 (GAUZE/BANDAGES/DRESSINGS) ×1 IMPLANT
STRIP CLOSURE SKIN 1/4X4 (GAUZE/BANDAGES/DRESSINGS) ×1 IMPLANT
SUT ETHILON 3-0 FS-10 30 BLK (SUTURE)
SUT ETHILON 4-0 FS2 18XMFL BLK (SUTURE)
SUT ETHILON 5-0 FS-2 18 BLK (SUTURE) IMPLANT
SUT MNCRL 5-0+ PC-1 (SUTURE) IMPLANT
SUT VIC AB 3-0 SH 27X BRD (SUTURE) IMPLANT
SUT VIC AB 4-0 FS2 27 (SUTURE) IMPLANT
SUTURE EHLN 3-0 FS-10 30 BLK (SUTURE) IMPLANT
SUTURE ETHLN 4-0 FS2 18XMF BLK (SUTURE) IMPLANT
SWAB CULTURE AMIES ANAERIB BLU (MISCELLANEOUS) IMPLANT
SYR 10ML LL (SYRINGE) ×1 IMPLANT
TIP FAN IRRIG PULSAVAC PLUS (DISPOSABLE) IMPLANT
TRAP FLUID SMOKE EVACUATOR (MISCELLANEOUS) ×1 IMPLANT
WATER STERILE IRR 500ML POUR (IV SOLUTION) ×1 IMPLANT

## 2023-07-09 NOTE — Op Note (Signed)
PODIATRY / FOOT AND ANKLE SURGERY OPERATIVE REPORT    SURGEON: Rosetta Posner, DPM  PRE-OPERATIVE DIAGNOSIS:  1.  Right plantar second metatarsal phalange joint ulceration, subcutaneous 2.  Plantarflexed metatarsals 2, 3, 4, 5 right foot 3.  Diabetes type 2 polyneuropathy  POST-OPERATIVE DIAGNOSIS: Same  PROCEDURE(S): Right metatarsal head resections 2 through 5 Debridement of ulceration subcutaneous 100 percent excisional right second metatarsal phalange joint plantar  HEMOSTASIS: Right ankle tourniquet  ANESTHESIA: general  ESTIMATED BLOOD LOSS: 20 cc  FINDING(S): 1.  Dislocated second digit at second metatarsal phalangeal joint 2.  Ulceration subsecond metatarsal phalange joint right foot measures 0.3 x 0.3 x 0.2 cm, no bone exposed at this time.  PATHOLOGY/SPECIMEN(S): Bone culture right second metatarsal; path specimen right second, third, fourth, fifth metatarsals, proximal margin marked in purple ink the second metatarsal area of ulceration  INDICATIONS:   Cody Burke is a 58 y.o. 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  Patient has been treated conservatively with local wound care and offloading but continues to reulcerate.  All treatment options were discussed with the patient both conservative and surgical attempts at correction include potential risks and complications at this time patient is elected for surgical intervention consisting of right second, third, fourth, fifth metatarsal head resections with wound debridement.  No guarantees given.  Consent obtained prior to procedure.  Patient is at high risk for limb loss due to history of diabetes as well as previous amputation along with chronic  wound..  DESCRIPTION: After obtaining full informed written consent, the patient was brought back to the operating room and placed supine upon the operating table.  The patient received IV antibiotics prior to induction.  After obtaining adequate anesthesia, the patient was prepped and draped in the standard fashion.  Exsanguinated the right lower extremity with Esmarch bandage, pneumatic ankle tourniquet inflated.  20 cc of half percent Marcaine plain was injected about the second, third, fourth, fifth rays to block the area.  Incisions were then made dorsally x 2.  1 incision was made between the second and third metatarsal phalange joints and another was made between the fourth and fifth metatarsal phalangeal joints.  The incisions were measured to be approximately 4 cm apart to create an adequate skin bridge between the 2 incisions.  The incisions were deepened to the subcutaneous tissue utilizing sharp and blunt dissection care was taken to identify and retract all vital neurovascular structures no venous contributories were cauterized necessary.  At this time a capsular incision was made into the dorsal lateral aspect of the second metatarsal phalangeal joint.  The capsular and periosteal tissue was reflected medially and laterally at the operative site thereby exposing the second metatarsal distally.  The proximal phalanx appeared to be dorsally dislocated over the second metatarsal head.  A McGlamery elevator was used to free up the adhesions and to free up the capsular tissue.  At this time sagittal bone saw was then used to create an osteotomy through the second metatarsal head with the appropriate beveling.  The second metatarsal head was then dissected free and removed and passed off the operative site.  A small bone culture was taken from the area as well as the proximal margin was marked in purple ink and sent off to pathology.  Through the same incision the third metatarsal phalange joint was  identified after a capsulotomy was performed and the collateral and suspensory ligaments were released as well as the periosteum from the tissue covering the area.  The third metatarsal head was then resected and passed off the operative site and sent to pathology as well.  Through the more lateral incision dissection was continued down and capsular incisions were made into the fourth and fifth metatarsal phalangeal joints and the periosteal structures were also dissected free medially and laterally thereby exposing the fourth and fifth metatarsal phalangeal joints.  At this time the fourth and fifth metatarsal heads were resected with the appropriate beveling tried to maintain the parabola length that was created from the second metatarsal cut.  The fourth and fifth metatarsal heads were passed off in the operative site and sent off to pathology as well.  The surgical sites were flushed with copious amounts normal sterile saline.  The bone cuts where the osteotomies were performed were then packed with Surgicel for hemostasis control.  The capsular and periosteal structures were reapproximated well coapted all lesser metatarsal phalange joints with 3-0 Vicryl.  The subcutaneous tissue was reapproximated well coapted with 3-0 Vicryl and the skin was then reapproximated well coapted with 3-0 nylon in a combination of simple and horizontal mattress type stitching.  Attention was then directed to the plantar wound where 100% excisional wound debridement was performed with 15 blade, predebridement measurement was 0.3 x 0.3 x 0.2 cm, postdebridement was 0.3 x 0.3 x 0.3 cm going into subcutaneous tissue.  No signs of infection present to the area.  The pneumatic ankle tourniquet was deflated and a prompt hyperemic response was noted all digits of the right foot.  A postoperative dressing is applied consisting of Xeroform to the incisions followed by 4 x 4 gauze, ABD, Kerlix, Ace wrap from forefoot to below knee.   Patient tolerated the procedure and anesthesia well and was transferred to recovery in vital signs stable vascular status intact all toes the right foot.  Following a period of postoperative monitoring the patient be discharged home with the appropriate orders, instructions, medications.  Patient should remain partial weightbearing with heel contact and postop shoe but should try to stay out the foot for the most part at all times.  COMPLICATIONS: None  CONDITION: Good, stable  Rosetta Posner, DPM

## 2023-07-09 NOTE — Anesthesia Postprocedure Evaluation (Signed)
Anesthesia Post Note  Patient: Cody Burke  Procedure(s) Performed: 78295 & [id number] METATARSAL HEAD EXCISION X 4 (Right: Toe) 11042 - DEBRIDE SKIN SUBCUTANEOUS TISSUE (Right: Toe)  Patient location during evaluation: PACU Anesthesia Type: General Level of consciousness: awake and alert Pain management: pain level controlled Vital Signs Assessment: post-procedure vital signs reviewed and stable Respiratory status: spontaneous breathing, nonlabored ventilation, respiratory function stable and patient connected to nasal cannula oxygen Cardiovascular status: blood pressure returned to baseline and stable Postop Assessment: no apparent nausea or vomiting Anesthetic complications: no   No notable events documented.   Last Vitals:  Vitals:   07/09/23 1305 07/09/23 1310  BP:    Pulse: 64 64  Resp: 14 13  Temp:    SpO2: 97% 94%    Last Pain:  Vitals:   07/09/23 1253  TempSrc:   PainSc: Asleep                 Corinda Gubler

## 2023-07-09 NOTE — Transfer of Care (Signed)
Immediate Anesthesia Transfer of Care Note  Patient: Cody Burke  Procedure(s) Performed: 65784 & 69629 METATARSAL HEAD EXCISION X 4 (Right: Toe) 11042 - DEBRIDE SKIN SUBCUTANEOUS TISSUE (Right: Toe)  Patient Location: PACU  Anesthesia Type:General  Level of Consciousness: drowsy  Airway & Oxygen Therapy: Patient connected to face mask oxygen  Post-op Assessment: Report given to RN and Post -op Vital signs reviewed and stable  Post vital signs: Reviewed and stable  Last Vitals:  Vitals Value Taken Time  BP 126/82 07/09/23 1253  Temp 35.9 1253  Pulse 69 07/09/23 1255  Resp 13 07/09/23 1255  SpO2 99 % 07/09/23 1255  Vitals shown include unfiled device data.  Last Pain:  Vitals:   07/09/23 0941  TempSrc: Temporal  PainSc: 0-No pain         Complications: No notable events documented.

## 2023-07-09 NOTE — Anesthesia Procedure Notes (Signed)
Procedure Name: LMA Insertion Date/Time: 07/09/2023 11:31 AM  Performed by: Morene Crocker, CRNAPre-anesthesia Checklist: Patient identified, Patient being monitored, Timeout performed, Emergency Drugs available and Suction available Patient Re-evaluated:Patient Re-evaluated prior to induction Oxygen Delivery Method: Circle system utilized Preoxygenation: Pre-oxygenation with 100% oxygen Induction Type: IV induction Ventilation: Mask ventilation without difficulty LMA: LMA inserted LMA Size: 5.0 Tube type: Oral Number of attempts: 1 Placement Confirmation: positive ETCO2 and breath sounds checked- equal and bilateral Tube secured with: Tape Dental Injury: Teeth and Oropharynx as per pre-operative assessment  Comments: Smooth atraumatic LMA placement, no complications noted.

## 2023-07-09 NOTE — Anesthesia Preprocedure Evaluation (Signed)
Anesthesia Evaluation  Patient identified by MRN, date of birth, ID band Patient awake    Reviewed: Allergy & Precautions, NPO status , Patient's Chart, lab work & pertinent test results  History of Anesthesia Complications Negative for: history of anesthetic complications  Airway Mallampati: III  TM Distance: >3 FB Neck ROM: Full    Dental no notable dental hx. (+) Teeth Intact   Pulmonary sleep apnea , neg COPD, Patient abstained from smoking.Not current smoker, former smoker   Pulmonary exam normal breath sounds clear to auscultation       Cardiovascular Exercise Tolerance: Good METShypertension, Pt. on medications + Peripheral Vascular Disease  (-) CAD and (-) Past MI (-) dysrhythmias  Rhythm:Regular Rate:Normal - Systolic murmurs    Neuro/Psych negative neurological ROS  negative psych ROS   GI/Hepatic ,neg GERD  ,,(+)     (-) substance abuse    Endo/Other  diabetes, Well Controlled, Type 2    Renal/GU negative Renal ROS     Musculoskeletal   Abdominal  (+) + obese  Peds  Hematology   Anesthesia Other Findings Past Medical History: No date: Diabetes mellitus without complication (HCC) No date: Hyperlipidemia No date: Hypertension No date: Obesity No date: Peripheral vascular disease (HCC) No date: Sleep apnea     Comment:  severe - new DX - has not gotten CPAP yet  Reproductive/Obstetrics                             Anesthesia Physical Anesthesia Plan  ASA: 3  Anesthesia Plan: General   Post-op Pain Management: Ofirmev IV (intra-op)* and Toradol IV (intra-op)*   Induction: Intravenous  PONV Risk Score and Plan: 3 and Ondansetron, Dexamethasone and Midazolam  Airway Management Planned: LMA  Additional Equipment: None  Intra-op Plan:   Post-operative Plan: Extubation in OR  Informed Consent: I have reviewed the patients History and Physical, chart, labs and  discussed the procedure including the risks, benefits and alternatives for the proposed anesthesia with the patient or authorized representative who has indicated his/her understanding and acceptance.     Dental advisory given  Plan Discussed with: CRNA and Surgeon  Anesthesia Plan Comments: (Discussed risks of anesthesia with patient, including PONV, sore throat, lip/dental/eye damage. Rare risks discussed as well, such as cardiorespiratory and neurological sequelae, and allergic reactions. Discussed the role of CRNA in patient's perioperative care. Patient understands. )        Anesthesia Quick Evaluation

## 2023-07-09 NOTE — H&P (Signed)
HISTORY AND PHYSICAL INTERVAL NOTE:  07/09/2023  10:50 AM  Cody Burke  has presented today for surgery, with the diagnosis of E11.42 - Type 2 diabetes mellitus with diabetic poly neuropathy L97.512 - Ulcer of right foot with fat layer exposed M20.41 - Hammertoe of right foot M21.6X1 - Plantar flexed metatarsal, right.  The various methods of treatment have been discussed with the patient.  No guarantees were given.  After consideration of risks, benefits and other options for treatment, the patient has consented to surgery.  I have reviewed the patients' chart and labs.    CV: RRR, no murmur obvious Resp: CTA, nonlabored  PROCEDURE: RIGHT FOOT METATARSAL HEAD RESECTIONS 2-5 DEBRIDEMENT OF WOUND RIGHT FOOT, SUBCUTANEOUS  A history and physical examination was performed in my office.  The patient was reexamined.  There have been no changes to this history and physical examination.  Rosetta Posner, DPM

## 2023-07-10 ENCOUNTER — Encounter: Payer: Self-pay | Admitting: Podiatry

## 2023-07-13 LAB — SURGICAL PATHOLOGY

## 2023-07-14 LAB — AEROBIC/ANAEROBIC CULTURE W GRAM STAIN (SURGICAL/DEEP WOUND)
Culture: NO GROWTH
Gram Stain: NONE SEEN

## 2023-07-15 DIAGNOSIS — L97512 Non-pressure chronic ulcer of other part of right foot with fat layer exposed: Secondary | ICD-10-CM | POA: Diagnosis not present

## 2023-07-22 DIAGNOSIS — L97512 Non-pressure chronic ulcer of other part of right foot with fat layer exposed: Secondary | ICD-10-CM | POA: Diagnosis not present

## 2023-07-23 ENCOUNTER — Ambulatory Visit: Payer: BC Managed Care – PPO | Admitting: Family Medicine

## 2023-07-23 ENCOUNTER — Encounter: Payer: Self-pay | Admitting: Family Medicine

## 2023-07-23 VITALS — BP 136/79 | HR 64 | Temp 97.8°F | Resp 18 | Ht 72.6 in | Wt 303.6 lb

## 2023-07-23 DIAGNOSIS — E1169 Type 2 diabetes mellitus with other specified complication: Secondary | ICD-10-CM | POA: Diagnosis not present

## 2023-07-23 LAB — BAYER DCA HB A1C WAIVED: HB A1C (BAYER DCA - WAIVED): 7.5 % — ABNORMAL HIGH (ref 4.8–5.6)

## 2023-07-23 NOTE — Progress Notes (Addendum)
 BP 136/79 (BP Location: Left Arm, Patient Position: Sitting, Cuff Size: Large)   Pulse 64   Temp 97.8 F (36.6 C) (Oral)   Resp 18   Ht 6' 0.6" (1.844 m)   Wt (!) 303 lb 9.6 oz (137.7 kg)   SpO2 98%   BMI 40.50 kg/m    Subjective:    Patient ID: Cody Burke, male    DOB: 1966/02/19, 58 y.o.   MRN: [id number]  HPI: Cody Burke is a 58 y.o. male  Chief Complaint  Patient presents with   Diabetes    No concerns, due for A1C   DIABETES Hypoglycemic episodes:no Polydipsia/polyuria: no Visual disturbance: no Chest pain: no Paresthesias: yes Glucose Monitoring: yes  Accucheck frequency: continuous Taking Insulin?: no Blood Pressure Monitoring: not checking Retinal Examination: Up to Date Foot Exam: Up to Date Diabetic Education: Completed Pneumovax: Up to Date Influenza: Up to Date Aspirin: no  Applying for disability. 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   Relevant past medical, surgical, family and social history reviewed and updated as indicated. Interim medical history since our last visit reviewed. Allergies and medications reviewed and updated.  Review of Systems  Constitutional: Negative.   Respiratory: Negative.    Cardiovascular: Negative.   Musculoskeletal: Negative.   Skin:  Positive for wound. Negative for color change, pallor and rash.  Psychiatric/Behavioral: Negative.      Per HPI unless specifically indicated above     Objective:    BP 136/79 (BP Location: Left Arm, Patient Position: Sitting, Cuff Size: Large)   Pulse 64   Temp 97.8 F (36.6 C) (Oral)   Resp 18   Ht 6' 0.6" (1.844 m)   Wt (!) 303 lb 9.6 oz (137.7 kg)   SpO2 98%   BMI 40.50 kg/m   Wt Readings from Last 3 Encounters:  07/23/23 (!) 303 lb 9.6 oz (137.7 kg)  07/09/23 299 lb (135.6 kg)  07/07/23 299 lb (135.6 kg)    Physical Exam Vitals and nursing note reviewed.  Constitutional:      General: He is not in acute distress.    Appearance:  Normal appearance. He is obese. He is not ill-appearing, toxic-appearing or diaphoretic.  HENT:     Head: Normocephalic and atraumatic.     Right Ear: External ear normal.     Left Ear: External ear normal.     Nose: Nose normal.     Mouth/Throat:     Mouth: Mucous membranes are moist.     Pharynx: Oropharynx is clear.  Eyes:     General: No scleral icterus.       Right eye: No discharge.        Left eye: No discharge.     Extraocular Movements: Extraocular movements intact.     Conjunctiva/sclera: Conjunctivae normal.     Pupils: Pupils are equal, round, and reactive to light.  Cardiovascular:     Rate and Rhythm: Normal rate and regular rhythm.     Pulses: Normal pulses.     Heart sounds: Normal heart sounds. No murmur heard.    No friction rub. No gallop.  Pulmonary:     Effort: Pulmonary effort is normal. No respiratory distress.     Breath sounds: Normal breath sounds. No stridor. No wheezing, rhonchi or rales.  Chest:     Chest wall: No tenderness.  Musculoskeletal:        General: Normal range of motion.     Cervical back: Normal  range of motion and neck supple.  Skin:    General: Skin is warm and dry.     Capillary Refill: Capillary refill takes less than 2 seconds.     Coloration: Skin is not jaundiced or pale.     Findings: No bruising, erythema, lesion or rash.  Neurological:     General: No focal deficit present.     Mental Status: He is alert and oriented to person, place, and time. Mental status is at baseline.  Psychiatric:        Mood and Affect: Mood normal.        Behavior: Behavior normal.        Thought Content: Thought content normal.        Judgment: Judgment normal.     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 Will continue current regimen and really work on his diet. Recheck 3 months. Call with any concerns. Will sign paperwork for diabetic shoes.       Relevant Orders   Bayer DCA Hb A1c Waived (STAT) (Completed)     Follow up plan: Return in about 3 months (around 10/20/2023).

## 2023-07-23 NOTE — Assessment & Plan Note (Addendum)
 A1c improved at 7.5 down from 7.9. Will continue current regimen and really work on his diet. Recheck 3 months. Call with any concerns. Will sign paperwork for diabetic shoes.

## 2023-07-29 DIAGNOSIS — L603 Nail dystrophy: Secondary | ICD-10-CM | POA: Diagnosis not present

## 2023-07-29 DIAGNOSIS — L97511 Non-pressure chronic ulcer of other part of right foot limited to breakdown of skin: Secondary | ICD-10-CM | POA: Diagnosis not present

## 2023-07-29 DIAGNOSIS — E1142 Type 2 diabetes mellitus with diabetic polyneuropathy: Secondary | ICD-10-CM | POA: Diagnosis not present

## 2023-08-14 DIAGNOSIS — L97511 Non-pressure chronic ulcer of other part of right foot limited to breakdown of skin: Secondary | ICD-10-CM | POA: Diagnosis not present

## 2023-08-14 DIAGNOSIS — Z89411 Acquired absence of right great toe: Secondary | ICD-10-CM | POA: Diagnosis not present

## 2023-08-14 DIAGNOSIS — E1142 Type 2 diabetes mellitus with diabetic polyneuropathy: Secondary | ICD-10-CM | POA: Diagnosis not present

## 2023-09-15 DIAGNOSIS — Z89421 Acquired absence of other right toe(s): Secondary | ICD-10-CM | POA: Diagnosis not present

## 2023-09-15 DIAGNOSIS — E1142 Type 2 diabetes mellitus with diabetic polyneuropathy: Secondary | ICD-10-CM | POA: Diagnosis not present

## 2023-09-15 DIAGNOSIS — L97512 Non-pressure chronic ulcer of other part of right foot with fat layer exposed: Secondary | ICD-10-CM | POA: Diagnosis not present

## 2023-09-24 ENCOUNTER — Other Ambulatory Visit: Payer: Self-pay | Admitting: Urology

## 2023-10-01 ENCOUNTER — Ambulatory Visit: Payer: Self-pay | Admitting: Urology

## 2023-10-01 ENCOUNTER — Encounter: Payer: Self-pay | Admitting: Urology

## 2023-10-01 VITALS — BP 146/79 | HR 76 | Ht 72.0 in | Wt 303.0 lb

## 2023-10-01 DIAGNOSIS — R3915 Urgency of urination: Secondary | ICD-10-CM

## 2023-10-01 DIAGNOSIS — N401 Enlarged prostate with lower urinary tract symptoms: Secondary | ICD-10-CM

## 2023-10-01 LAB — BLADDER SCAN AMB NON-IMAGING: Scan Result: 0

## 2023-10-01 MED ORDER — SILODOSIN 8 MG PO CAPS: 8.0000 mg | ORAL_CAPSULE | Freq: Every day | ORAL | 3 refills | Status: AC

## 2023-10-01 NOTE — Progress Notes (Signed)
 I, Maysun Jamey Mccallum, acting as a scribe for Geraline Knapp, MD., have documented all relevant documentation on the behalf of Geraline Knapp, MD, as directed by Geraline Knapp, MD while in the presence of Geraline Knapp, MD.  10/01/2023 2:29 PM   Lyell Samuel Coleen Dauer [birth year] [id number]  Referring provider: Solomon Dupre, DO [address] [place],  [place] [address]  Chief Complaint  Patient presents with   Benign Prostatic Hypertrophy   Urologic history: 1. BPH with LUTS -Silodosin     2. Erectile dysfunction -Sildenafil    HPI: Cody Burke is a 58 y.o. male presents for a 6 month follow-up.   Initially seen 02/27/2023 with bothersome lower urinary tract symptoms and incomplete emptying with a residual of 121 mL. Since his last visit, he has been doing quite well and has no bothersome lower urinary tract symptoms. He remains on Silodosin .  He was given samples of Gemtesa  at last visit for urge incontinence, though elected not to take the medication States his symptoms have significantly improved. He has occasional episodes of small volume urge incontinence when holding his urine Denies dysuria, gross hematuria  Denies flank, abdominal, or pelvic pain.  PSA 03/2023 undetectable <0.1  PSA trend  Prostate Specific Ag, Serum  Latest Ref Rng 0.0 - 4.0 ng/mL  03/08/2019 <0.1   03/09/2020 <0.1   03/14/2021 <0.1   04/11/2022 <0.1   04/21/2023 <0.1      PMH: Past Medical History:  Diagnosis Date   Arthritis    Benign prostatic hyperplasia with lower urinary tract symptoms    Cellulitis in diabetic foot (HCC)    Erectile dysfunction    Hammer toe of right foot    Hyperlipidemia    Hypertension    Lymphedema    Obesity    Peripheral vascular disease (HCC)    Severe sepsis (HCC) 05/08/2022   Last Assessment & Plan:      Resolved.     Sleep apnea    severe - new DX - has not gotten CPAP yet   Type 2 diabetes mellitus with diabetic polyneuropathy, without long-term  current use of insulin  [facility])     Surgical History: Past Surgical History:  Procedure Laterality Date   AMPUTATION Right 05/09/2022   Procedure: AMPUTATION FIRST RAY;  Surgeon: Pink Bridges, DPM;  Location: [facility];  Service: Podiatry;  Laterality: Right;   ARTHRODESIS METATARSALPHALANGEAL JOINT (MTPJ) Right 03/07/2022   Procedure: 84132 - Shirlene Doughty;  Surgeon: Anell Baptist, DPM;  Location: [facility];  Service: Podiatry;  Laterality: Right;   COLONOSCOPY WITH PROPOFOL  N/A 09/21/2015   Procedure: COLONOSCOPY WITH PROPOFOL ;  Surgeon: Marnee Sink, MD;  Location: [facility];  Service: Endoscopy;  Laterality: N/A;  Diabetic - oral meds sleep apnea   METATARSAL HEAD EXCISION Right 07/09/2023   Procedure: 28112 & 44010 METATARSAL HEAD EXCISION X 4;  Surgeon: Pink Bridges, DPM;  Location: [facility];  Service: Orthopedics/Podiatry;  Laterality: Right;   SESMOIDECTOMY Right 03/07/2022   Procedure: SESMOIDECTOMY;  Surgeon: Anell Baptist, DPM;  Location: [facility];  Service: Podiatry;  Laterality: Right;   VASCULAR SURGERY Bilateral    legs   WOUND DEBRIDEMENT Right 03/07/2022   Procedure: DEBRIDEMENT OF RIGHT FOOT ULCER;  Surgeon: Anell Baptist, DPM;  Location: [facility];  Service: Podiatry;  Laterality: Right;   WOUND DEBRIDEMENT Right 07/09/2023   Procedure: 11042 - DEBRIDE SKIN SUBCUTANEOUS TISSUE;  Surgeon: Pink Bridges, DPM;  Location: [facility];  Service: Orthopedics/Podiatry;  Laterality: Right;    Home Medications:  Allergies as of 10/01/2023   No Known Allergies      Medication List        Accurate as of Oct 01, 2023  2:29 PM. If you have any questions, ask your nurse or doctor.          aspirin  EC 81 MG tablet Take 81 mg by mouth daily.   atorvastatin  20 MG tablet Commonly known as: LIPITOR Take 1 tablet (20 mg total) by mouth daily.   benazepril  40 MG tablet Commonly known as: LOTENSIN  Take 1 tablet (40 mg total) by mouth daily.   dapagliflozin   propanediol 10 MG Tabs tablet Commonly known as: Farxiga  Take 10 mg by mouth daily.   Fish Oil 1000 MG Caps Take 1,000 mg by mouth daily.   glucose blood test strip Please dispense insurance preference. Use to check BS BID   metFORMIN  500 MG 24 hr tablet Commonly known as: GLUCOPHAGE -XR Take 2 tablets (1,000 mg total) by mouth 2 (two) times daily.   MULTIVITAMIN GUMMIES ADULT PO Take by mouth daily.   ondansetron  4 MG tablet Commonly known as: Zofran  Take 1 tablet (4 mg total) by mouth every 8 (eight) hours as needed for nausea or vomiting.   silodosin  8 MG Caps capsule Commonly known as: RAPAFLO  Take 1 capsule (8 mg total) by mouth daily with breakfast.   Sodium Fluoride 5000 PPM 1.1 % Gel dental gel Generic drug: sodium fluoride Place 1 Application onto teeth at bedtime.        Allergies: No Known Allergies  Family History: Family History  Problem Relation Age of Onset   Cancer Mother    Hypertension Father     Social History:  reports that he quit smoking about 20 years ago. His smoking use included cigarettes. He has never used smokeless tobacco. He reports current alcohol use. He reports that he does not use drugs.   Physical Exam: BP (!) 146/79   Pulse 76   Ht 6' (1.829 m)   Wt (!) 303 lb (137.4 kg)   BMI 41.09 kg/m   Constitutional:  Alert and oriented, No acute distress. HEENT: [place] AT Respiratory: Normal respiratory effort, no increased work of breathing. Psychiatric: Normal mood and affect.   Assessment & Plan:    1. BPH with LUTS Doing well on silodosin  PVR today 0 mL Silodosin  refilled and he will continue annual follow-up.  I have reviewed the above documentation for accuracy and completeness, and I agree with the above.   Geraline Knapp, MD  [facility] [address], [address] [place], [place] [address] [phone]

## 2023-10-20 DIAGNOSIS — Z9889 Other specified postprocedural states: Secondary | ICD-10-CM | POA: Diagnosis not present

## 2023-10-20 DIAGNOSIS — R531 Weakness: Secondary | ICD-10-CM | POA: Diagnosis not present

## 2023-10-20 DIAGNOSIS — R262 Difficulty in walking, not elsewhere classified: Secondary | ICD-10-CM | POA: Diagnosis not present

## 2023-10-22 ENCOUNTER — Ambulatory Visit: Payer: BC Managed Care – PPO | Admitting: Family Medicine

## 2023-10-22 ENCOUNTER — Encounter: Payer: Self-pay | Admitting: Family Medicine

## 2023-10-22 VITALS — BP 136/86 | HR 71 | Ht 73.0 in | Wt 302.4 lb

## 2023-10-22 DIAGNOSIS — I10 Essential (primary) hypertension: Secondary | ICD-10-CM | POA: Diagnosis not present

## 2023-10-22 DIAGNOSIS — E1169 Type 2 diabetes mellitus with other specified complication: Secondary | ICD-10-CM | POA: Diagnosis not present

## 2023-10-22 DIAGNOSIS — E7800 Pure hypercholesterolemia, unspecified: Secondary | ICD-10-CM | POA: Diagnosis not present

## 2023-10-22 LAB — BAYER DCA HB A1C WAIVED: HB A1C (BAYER DCA - WAIVED): 7 % — ABNORMAL HIGH (ref 4.8–5.6)

## 2023-10-22 MED ORDER — ATORVASTATIN CALCIUM 20 MG PO TABS: 20.0000 mg | ORAL_TABLET | Freq: Every day | ORAL | 1 refills | Status: DC

## 2023-10-22 MED ORDER — BENAZEPRIL HCL 40 MG PO TABS: 40.0000 mg | ORAL_TABLET | Freq: Every day | ORAL | 1 refills | Status: DC

## 2023-10-22 MED ORDER — DAPAGLIFLOZIN PROPANEDIOL 10 MG PO TABS: ORAL_TABLET | ORAL | 1 refills | Status: DC

## 2023-10-22 MED ORDER — METFORMIN HCL ER 500 MG PO TB24: 1000.0000 mg | ORAL_TABLET | Freq: Two times a day (BID) | ORAL | 1 refills | Status: DC

## 2023-10-22 NOTE — Assessment & Plan Note (Signed)
 Under good control on current regimen. Continue current regimen. Continue to monitor. Call with any concerns. Refills given. Labs drawn today.

## 2023-10-22 NOTE — Progress Notes (Signed)
 BP 136/86   Pulse 71   Ht 6\' 1"  (1.854 m)   Wt (!) 302 lb 6.4 oz (137.2 kg)   SpO2 100%   BMI 39.90 kg/m    Subjective:    Patient ID: Cody Burke, male    DOB: 06/25/1965, 58 y.o.   MRN: [id number]  HPI: Sosaia Pittinger is a 58 y.o. male  Chief Complaint  Patient presents with   Diabetes   DIABETES Hypoglycemic episodes:no Polydipsia/polyuria: no Visual disturbance: no Chest pain: no Paresthesias: yes Glucose Monitoring: yes  Accucheck frequency: Daily Taking Insulin ?: no Blood Pressure Monitoring: not checking Retinal Examination: Up to Date Foot Exam: Up to Date Diabetic Education: Completed Pneumovax: Up to Date Influenza: Up to Date Aspirin : yes  HYPERTENSION / HYPERLIPIDEMIA Satisfied with current treatment? yes Duration of hypertension: chronic BP monitoring frequency: not checking BP medication side effects: no Past BP meds: benazepril  Duration of hyperlipidemia: chronic Cholesterol medication side effects: no Cholesterol supplements: fish oil Past cholesterol medications: atorvastatin  Medication compliance: excellent compliance Aspirin : yes Recent stressors: no Recurrent headaches: no Visual changes: no Palpitations: no Dyspnea: no Chest pain: no Lower extremity edema: no Dizzy/lightheaded: no  Relevant past medical, surgical, family and social history reviewed and updated as indicated. Interim medical history since our last visit reviewed. Allergies and medications reviewed and updated.  Review of Systems  Constitutional:  Positive for fatigue. Negative for activity change, appetite change, chills, diaphoresis, fever and unexpected weight change.  Respiratory: Negative.    Cardiovascular: Negative.   Musculoskeletal: Negative.   Neurological:  Positive for weakness. Negative for dizziness, tremors, seizures, syncope, facial asymmetry, speech difficulty, light-headedness, numbness and headaches.  Psychiatric/Behavioral: Negative.       Per HPI unless specifically indicated above     Objective:     BP 136/86   Pulse 71   Ht 6\' 1"  (1.854 m)   Wt (!) 302 lb 6.4 oz (137.2 kg)   SpO2 100%   BMI 39.90 kg/m   Wt Readings from Last 3 Encounters:  10/22/23 (!) 302 lb 6.4 oz (137.2 kg)  10/01/23 (!) 303 lb (137.4 kg)  07/23/23 (!) 303 lb 9.6 oz (137.7 kg)    Physical Exam Vitals and nursing note reviewed.  Constitutional:      General: He is not in acute distress.    Appearance: Normal appearance. He is not ill-appearing, toxic-appearing or diaphoretic.  HENT:     Head: Normocephalic and atraumatic.     Right Ear: External ear normal.     Left Ear: External ear normal.     Nose: Nose normal.     Mouth/Throat:     Mouth: Mucous membranes are moist.     Pharynx: Oropharynx is clear.  Eyes:     General: No scleral icterus.       Right eye: No discharge.        Left eye: No discharge.     Extraocular Movements: Extraocular movements intact.     Conjunctiva/sclera: Conjunctivae normal.     Pupils: Pupils are equal, round, and reactive to light.  Cardiovascular:     Rate and Rhythm: Normal rate and regular rhythm.     Pulses: Normal pulses.     Heart sounds: Normal heart sounds. No murmur heard.    No friction rub. No gallop.  Pulmonary:     Effort: Pulmonary effort is normal. No respiratory distress.     Breath sounds: Normal breath sounds. No stridor. No wheezing, rhonchi or rales.  Chest:     Chest wall: No tenderness.  Musculoskeletal:        General: Normal range of motion.     Cervical back: Normal range of motion and neck supple.  Skin:    General: Skin is warm and dry.     Capillary Refill: Capillary refill takes less than 2 seconds.     Coloration: Skin is not jaundiced or pale.     Findings: No bruising, erythema, lesion or rash.  Neurological:     General: No focal deficit present.     Mental Status: He is alert and oriented to person, place, and time. Mental status is at baseline.   Psychiatric:        Mood and Affect: Mood normal.        Behavior: Behavior normal.        Thought Content: Thought content normal.        Judgment: Judgment normal.     Results for orders placed or performed in visit on 10/01/23  Bladder Scan (Post Void Residual) in office   Collection Time: 10/01/23  8:59 AM  Result Value Ref Range   Scan Result 0       Assessment & Plan:   Problem List Items Addressed This Visit       Cardiovascular and Mediastinum   Hypertension   Under good control on current regimen. Continue current regimen. Continue to monitor. Call with any concerns. Refills given. Labs drawn today.        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 Continue current regimen. Continue to monitor. Call with any concerns.       Relevant Medications   atorvastatin  (LIPITOR) 20 MG tablet   benazepril  (LOTENSIN ) 40 MG tablet   dapagliflozin  propanediol (FARXIGA ) 10 MG TABS tablet   metFORMIN  (GLUCOPHAGE -XR) 500 MG 24 hr tablet   Other Relevant Orders   Bayer DCA Hb A1c Waived (STAT)   CBC with Differential/Platelet   Comprehensive metabolic panel with GFR   Lipid Panel w/o Chol/HDL Ratio     Other   Hyperlipidemia   Under good control on current regimen. Continue current regimen. Continue to monitor. Call with any concerns. Refills given. Labs drawn today.        Relevant Medications   atorvastatin  (LIPITOR) 20 MG tablet   benazepril  (LOTENSIN ) 40 MG tablet     Follow up plan: Return in about 3 months (around 01/22/2024).

## 2023-10-22 NOTE — Assessment & Plan Note (Signed)
Doing great with A1c of 7.0. Continue current regimen. Continue to monitor. Call with any concerns.  

## 2023-10-23 LAB — COMPREHENSIVE METABOLIC PANEL WITH GFR
ALT: 45 IU/L — ABNORMAL HIGH (ref 0–44)
AST: 35 IU/L (ref 0–40)
Albumin: 4.1 g/dL (ref 3.8–4.9)
Alkaline Phosphatase: 97 IU/L (ref 44–121)
BUN/Creatinine Ratio: 12 (ref 9–20)
BUN: 13 mg/dL (ref 6–24)
Bilirubin Total: 0.3 mg/dL (ref 0.0–1.2)
CO2: 21 mmol/L (ref 20–29)
Calcium: 9.1 mg/dL (ref 8.7–10.2)
Chloride: 100 mmol/L (ref 96–106)
Creatinine, Ser: 1.05 mg/dL (ref 0.76–1.27)
Globulin, Total: 2.5 g/dL (ref 1.5–4.5)
Glucose: 157 mg/dL — ABNORMAL HIGH (ref 70–99)
Potassium: 4.6 mmol/L (ref 3.5–5.2)
Sodium: 137 mmol/L (ref 134–144)
Total Protein: 6.6 g/dL (ref 6.0–8.5)
eGFR: 82 mL/min/{1.73_m2} (ref >59)

## 2023-10-23 LAB — CBC WITH DIFFERENTIAL/PLATELET
Basophils Absolute: 0.1 10*3/uL (ref 0.0–0.2)
Basos: 1 %
EOS (ABSOLUTE): 0.3 10*3/uL (ref 0.0–0.4)
Eos: 4 %
Hematocrit: 52.5 % — ABNORMAL HIGH (ref 37.5–51.0)
Hemoglobin: 17.1 g/dL (ref 13.0–17.7)
Immature Grans (Abs): 0.1 10*3/uL (ref 0.0–0.1)
Immature Granulocytes: 1 %
Lymphocytes Absolute: 4.1 10*3/uL — ABNORMAL HIGH (ref 0.7–3.1)
Lymphs: 47 %
MCH: 30.5 pg (ref 26.6–33.0)
MCHC: 32.6 g/dL (ref 31.5–35.7)
MCV: 94 fL (ref 79–97)
Monocytes Absolute: 0.8 10*3/uL (ref 0.1–0.9)
Monocytes: 10 %
Neutrophils Absolute: 3.2 10*3/uL (ref 1.4–7.0)
Neutrophils: 37 %
Platelets: 234 10*3/uL (ref 150–450)
RBC: 5.61 x10E6/uL (ref 4.14–5.80)
RDW: 13.7 % (ref 11.6–15.4)
WBC: 8.6 10*3/uL (ref 3.4–10.8)

## 2023-10-23 LAB — LIPID PANEL W/O CHOL/HDL RATIO
Cholesterol, Total: 140 mg/dL (ref 100–199)
HDL: 42 mg/dL (ref >39)
LDL Chol Calc (NIH): 62 mg/dL (ref 0–99)
Triglycerides: 222 mg/dL — ABNORMAL HIGH (ref 0–149)
VLDL Cholesterol Cal: 36 mg/dL (ref 5–40)

## 2023-10-27 DIAGNOSIS — Z9889 Other specified postprocedural states: Secondary | ICD-10-CM | POA: Diagnosis not present

## 2023-10-27 DIAGNOSIS — R262 Difficulty in walking, not elsewhere classified: Secondary | ICD-10-CM | POA: Diagnosis not present

## 2023-10-30 DIAGNOSIS — R262 Difficulty in walking, not elsewhere classified: Secondary | ICD-10-CM | POA: Diagnosis not present

## 2023-10-30 DIAGNOSIS — Z9889 Other specified postprocedural states: Secondary | ICD-10-CM | POA: Diagnosis not present

## 2023-10-30 DIAGNOSIS — R531 Weakness: Secondary | ICD-10-CM | POA: Diagnosis not present

## 2023-11-02 ENCOUNTER — Ambulatory Visit: Payer: Self-pay | Admitting: Family Medicine

## 2023-11-02 DIAGNOSIS — E1142 Type 2 diabetes mellitus with diabetic polyneuropathy: Secondary | ICD-10-CM | POA: Diagnosis not present

## 2023-11-02 DIAGNOSIS — Z89411 Acquired absence of right great toe: Secondary | ICD-10-CM | POA: Diagnosis not present

## 2023-11-02 DIAGNOSIS — L603 Nail dystrophy: Secondary | ICD-10-CM | POA: Diagnosis not present

## 2023-11-02 DIAGNOSIS — I890 Lymphedema, not elsewhere classified: Secondary | ICD-10-CM | POA: Diagnosis not present

## 2023-11-02 DIAGNOSIS — L84 Corns and callosities: Secondary | ICD-10-CM | POA: Diagnosis not present

## 2023-11-02 DIAGNOSIS — I739 Peripheral vascular disease, unspecified: Secondary | ICD-10-CM | POA: Diagnosis not present

## 2023-11-02 DIAGNOSIS — L851 Acquired keratosis [keratoderma] palmaris et plantaris: Secondary | ICD-10-CM | POA: Diagnosis not present

## 2023-11-03 DIAGNOSIS — R262 Difficulty in walking, not elsewhere classified: Secondary | ICD-10-CM | POA: Diagnosis not present

## 2023-11-03 DIAGNOSIS — R531 Weakness: Secondary | ICD-10-CM | POA: Diagnosis not present

## 2023-11-03 DIAGNOSIS — Z9889 Other specified postprocedural states: Secondary | ICD-10-CM | POA: Diagnosis not present

## 2023-11-10 DIAGNOSIS — Z9889 Other specified postprocedural states: Secondary | ICD-10-CM | POA: Diagnosis not present

## 2023-11-10 DIAGNOSIS — R262 Difficulty in walking, not elsewhere classified: Secondary | ICD-10-CM | POA: Diagnosis not present

## 2023-11-12 DIAGNOSIS — Z9889 Other specified postprocedural states: Secondary | ICD-10-CM | POA: Diagnosis not present

## 2023-11-12 DIAGNOSIS — R262 Difficulty in walking, not elsewhere classified: Secondary | ICD-10-CM | POA: Diagnosis not present

## 2023-11-16 DIAGNOSIS — R262 Difficulty in walking, not elsewhere classified: Secondary | ICD-10-CM | POA: Diagnosis not present

## 2023-11-16 DIAGNOSIS — Z9889 Other specified postprocedural states: Secondary | ICD-10-CM | POA: Diagnosis not present

## 2023-11-16 DIAGNOSIS — R531 Weakness: Secondary | ICD-10-CM | POA: Diagnosis not present

## 2023-11-18 DIAGNOSIS — Z9889 Other specified postprocedural states: Secondary | ICD-10-CM | POA: Diagnosis not present

## 2023-11-23 DIAGNOSIS — Z9889 Other specified postprocedural states: Secondary | ICD-10-CM | POA: Diagnosis not present

## 2023-11-23 DIAGNOSIS — R262 Difficulty in walking, not elsewhere classified: Secondary | ICD-10-CM | POA: Diagnosis not present

## 2023-11-25 DIAGNOSIS — Z9889 Other specified postprocedural states: Secondary | ICD-10-CM | POA: Diagnosis not present

## 2023-11-25 DIAGNOSIS — R262 Difficulty in walking, not elsewhere classified: Secondary | ICD-10-CM | POA: Diagnosis not present

## 2023-11-25 DIAGNOSIS — R531 Weakness: Secondary | ICD-10-CM | POA: Diagnosis not present

## 2023-11-30 DIAGNOSIS — Z9889 Other specified postprocedural states: Secondary | ICD-10-CM | POA: Diagnosis not present

## 2023-12-03 DIAGNOSIS — Z9889 Other specified postprocedural states: Secondary | ICD-10-CM | POA: Diagnosis not present

## 2023-12-03 DIAGNOSIS — R262 Difficulty in walking, not elsewhere classified: Secondary | ICD-10-CM | POA: Diagnosis not present

## 2023-12-03 DIAGNOSIS — R531 Weakness: Secondary | ICD-10-CM | POA: Diagnosis not present

## 2023-12-09 DIAGNOSIS — Z9889 Other specified postprocedural states: Secondary | ICD-10-CM | POA: Diagnosis not present

## 2023-12-09 DIAGNOSIS — R262 Difficulty in walking, not elsewhere classified: Secondary | ICD-10-CM | POA: Diagnosis not present

## 2023-12-16 DIAGNOSIS — R262 Difficulty in walking, not elsewhere classified: Secondary | ICD-10-CM | POA: Diagnosis not present

## 2023-12-16 DIAGNOSIS — Z9889 Other specified postprocedural states: Secondary | ICD-10-CM | POA: Diagnosis not present

## 2023-12-23 DIAGNOSIS — R531 Weakness: Secondary | ICD-10-CM | POA: Diagnosis not present

## 2023-12-23 DIAGNOSIS — Z9889 Other specified postprocedural states: Secondary | ICD-10-CM | POA: Diagnosis not present

## 2023-12-23 DIAGNOSIS — R262 Difficulty in walking, not elsewhere classified: Secondary | ICD-10-CM | POA: Diagnosis not present

## 2024-01-27 ENCOUNTER — Ambulatory Visit: Admitting: Family Medicine

## 2024-01-27 ENCOUNTER — Encounter: Payer: Self-pay | Admitting: Family Medicine

## 2024-01-27 VITALS — BP 138/82 | HR 64 | Temp 97.5°F | Ht 73.0 in | Wt 304.8 lb

## 2024-01-27 DIAGNOSIS — Z23 Encounter for immunization: Secondary | ICD-10-CM

## 2024-01-27 DIAGNOSIS — Z789 Other specified health status: Secondary | ICD-10-CM | POA: Diagnosis not present

## 2024-01-27 DIAGNOSIS — Z7984 Long term (current) use of oral hypoglycemic drugs: Secondary | ICD-10-CM

## 2024-01-27 DIAGNOSIS — E1169 Type 2 diabetes mellitus with other specified complication: Secondary | ICD-10-CM

## 2024-01-27 LAB — BAYER DCA HB A1C WAIVED: HB A1C (BAYER DCA - WAIVED): 7.1 % — ABNORMAL HIGH (ref 4.8–5.6)

## 2024-01-27 NOTE — Assessment & Plan Note (Signed)
 Stable with A1c of 7.1. Continue current regimen. Continue to monitor. Call with any concerns.

## 2024-01-27 NOTE — Progress Notes (Signed)
 BP 138/82   Pulse 64   Temp (!) 97.5 F (36.4 C) (Oral)   Ht 6' 1 (1.854 m)   Wt (!) 304 lb 12.8 oz (138.3 kg)   SpO2 99%   BMI 40.21 kg/m    Subjective:    Patient ID: Cody Burke, male    DOB: Dec 05, 1965, 58 y.o.   MRN: [id number]  HPI: Cody Burke is a 59 y.o. male  Chief Complaint  Patient presents with   Diabetes   DIABETES Hypoglycemic episodes:no Polydipsia/polyuria: no Visual disturbance: no Chest pain: no Paresthesias: yes Glucose Monitoring: yes  Accucheck frequency: Daily Taking Insulin ?: no Blood Pressure Monitoring: not checking Retinal Examination: Up to Date Foot Exam: Up to Date Diabetic Education: Completed Pneumovax: Up to Date Influenza: Up to Date Aspirin : yes  Relevant past medical, surgical, family and social history reviewed and updated as indicated. Interim medical history since our last visit reviewed. Allergies and medications reviewed and updated.  Review of Systems  Constitutional: Negative.   Respiratory: Negative.    Cardiovascular: Negative.   Musculoskeletal: Negative.   Psychiatric/Behavioral: Negative.      Per HPI unless specifically indicated above     Objective:    BP 138/82   Pulse 64   Temp (!) 97.5 F (36.4 C) (Oral)   Ht 6' 1 (1.854 m)   Wt (!) 304 lb 12.8 oz (138.3 kg)   SpO2 99%   BMI 40.21 kg/m   Wt Readings from Last 3 Encounters:  01/27/24 (!) 304 lb 12.8 oz (138.3 kg)  10/22/23 (!) 302 lb 6.4 oz (137.2 kg)  10/01/23 (!) 303 lb (137.4 kg)    Physical Exam Vitals and nursing note reviewed.  Constitutional:      General: He is not in acute distress.    Appearance: Normal appearance. He is not ill-appearing, toxic-appearing or diaphoretic.  HENT:     Head: Normocephalic and atraumatic.     Right Ear: External ear normal.     Left Ear: External ear normal.     Nose: Nose normal.     Mouth/Throat:     Mouth: Mucous membranes are moist.     Pharynx: Oropharynx is clear.  Eyes:      General: No scleral icterus.       Right eye: No discharge.        Left eye: No discharge.     Extraocular Movements: Extraocular movements intact.     Conjunctiva/sclera: Conjunctivae normal.     Pupils: Pupils are equal, round, and reactive to light.  Cardiovascular:     Rate and Rhythm: Normal rate and regular rhythm.     Pulses: Normal pulses.     Heart sounds: Normal heart sounds. No murmur heard.    No friction rub. No gallop.  Pulmonary:     Effort: Pulmonary effort is normal. No respiratory distress.     Breath sounds: Normal breath sounds. No stridor. No wheezing, rhonchi or rales.  Chest:     Chest wall: No tenderness.  Musculoskeletal:        General: Normal range of motion.     Cervical back: Normal range of motion and neck supple.  Skin:    General: Skin is warm and dry.     Capillary Refill: Capillary refill takes less than 2 seconds.     Coloration: Skin is not jaundiced or pale.     Findings: No bruising, erythema, lesion or rash.  Neurological:     General:  No focal deficit present.     Mental Status: He is alert and oriented to person, place, and time. Mental status is at baseline.  Psychiatric:        Mood and Affect: Mood normal.        Behavior: Behavior normal.        Thought Content: Thought content normal.        Judgment: Judgment normal.     Results for orders placed or performed in visit on 10/22/23  Bayer DCA Hb A1c Waived (STAT)   Collection Time: 10/22/23  8:17 AM  Result Value Ref Range   HB A1C (BAYER DCA - WAIVED) 7.0 (H) 4.8 - 5.6 %  CBC with Differential/Platelet   Collection Time: 10/22/23  8:18 AM  Result Value Ref Range   WBC 8.6 3.4 - 10.8 x10E3/uL   RBC 5.61 4.14 - 5.80 x10E6/uL   Hemoglobin 17.1 13.0 - 17.7 g/dL   Hematocrit 47.4 (H) 62.4 - 51.0 %   MCV 94 79 - 97 fL   MCH 30.5 26.6 - 33.0 pg   MCHC 32.6 31.5 - 35.7 g/dL   RDW 86.2 88.3 - 84.5 %   Platelets 234 150 - 450 x10E3/uL   Neutrophils 37 Not Estab. %   Lymphs 47  Not Estab. %   Monocytes 10 Not Estab. %   Eos 4 Not Estab. %   Basos 1 Not Estab. %   Neutrophils Absolute 3.2 1.4 - 7.0 x10E3/uL   Lymphocytes Absolute 4.1 (H) 0.7 - 3.1 x10E3/uL   Monocytes Absolute 0.8 0.1 - 0.9 x10E3/uL   EOS (ABSOLUTE) 0.3 0.0 - 0.4 x10E3/uL   Basophils Absolute 0.1 0.0 - 0.2 x10E3/uL   Immature Granulocytes 1 Not Estab. %   Immature Grans (Abs) 0.1 0.0 - 0.1 x10E3/uL  Comprehensive metabolic panel with GFR   Collection Time: 10/22/23  8:18 AM  Result Value Ref Range   Glucose 157 (H) 70 - 99 mg/dL   BUN 13 6 - 24 mg/dL   Creatinine, Ser 8.94 0.76 - 1.27 mg/dL   eGFR 82 >40 fO/fpw/8.26   BUN/Creatinine Ratio 12 9 - 20   Sodium 137 134 - 144 mmol/L   Potassium 4.6 3.5 - 5.2 mmol/L   Chloride 100 96 - 106 mmol/L   CO2 21 20 - 29 mmol/L   Calcium  9.1 8.7 - 10.2 mg/dL   Total Protein 6.6 6.0 - 8.5 g/dL   Albumin 4.1 3.8 - 4.9 g/dL   Globulin, Total 2.5 1.5 - 4.5 g/dL   Bilirubin Total 0.3 0.0 - 1.2 mg/dL   Alkaline Phosphatase 97 44 - 121 IU/L   AST 35 0 - 40 IU/L   ALT 45 (H) 0 - 44 IU/L  Lipid Panel w/o Chol/HDL Ratio   Collection Time: 10/22/23  8:18 AM  Result Value Ref Range   Cholesterol, Total 140 100 - 199 mg/dL   Triglycerides 777 (H) 0 - 149 mg/dL   HDL 42 >60 mg/dL   VLDL Cholesterol Cal 36 5 - 40 mg/dL   LDL Chol Calc (NIH) 62 0 - 99 mg/dL      Assessment & Plan:   Problem List Items Addressed This Visit       Endocrine   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 Continue current regimen. Continue to monitor. Call with any concerns.       Relevant Orders   Bayer DCA Hb A1c Waived  Other Visit Diagnoses       Hepatitis B vaccination status unknown       Labs drawn today. Await results.   Relevant Orders   Hepatitis B surface antibody,quantitative     Needs flu shot       Flu shot given today.   Relevant Orders   Flu vaccine trivalent PF, 6mos and  older(Flulaval,Afluria,Fluarix,Fluzone)        Follow up plan: Return in about 3 months (around 04/27/2024) for physical.

## 2024-01-28 LAB — HEPATITIS B SURFACE ANTIBODY, QUANTITATIVE: Hepatitis B Surf Ab Quant: <3.5 m[IU]/mL — ABNORMAL LOW

## 2024-01-31 ENCOUNTER — Ambulatory Visit: Payer: Self-pay | Admitting: Family Medicine

## 2024-02-19 DIAGNOSIS — E1142 Type 2 diabetes mellitus with diabetic polyneuropathy: Secondary | ICD-10-CM | POA: Diagnosis not present

## 2024-02-19 DIAGNOSIS — L603 Nail dystrophy: Secondary | ICD-10-CM | POA: Diagnosis not present

## 2024-05-01 ENCOUNTER — Other Ambulatory Visit: Payer: Self-pay | Admitting: Family Medicine

## 2024-05-06 ENCOUNTER — Encounter: Payer: Self-pay | Admitting: Family Medicine

## 2024-05-06 ENCOUNTER — Ambulatory Visit: Admitting: Family Medicine

## 2024-05-06 VITALS — BP 139/87 | HR 66 | Temp 98.0°F | Ht 73.0 in | Wt 300.2 lb

## 2024-05-06 DIAGNOSIS — E1169 Type 2 diabetes mellitus with other specified complication: Secondary | ICD-10-CM | POA: Diagnosis not present

## 2024-05-06 DIAGNOSIS — E7800 Pure hypercholesterolemia, unspecified: Secondary | ICD-10-CM

## 2024-05-06 DIAGNOSIS — I10 Essential (primary) hypertension: Secondary | ICD-10-CM | POA: Diagnosis not present

## 2024-05-06 DIAGNOSIS — Z23 Encounter for immunization: Secondary | ICD-10-CM | POA: Diagnosis not present

## 2024-05-06 DIAGNOSIS — Z0000 Encounter for general adult medical examination without abnormal findings: Secondary | ICD-10-CM | POA: Diagnosis not present

## 2024-05-06 LAB — MICROALBUMIN, URINE WAIVED
Creatinine, Urine Waived: 200 mg/dL (ref 10–300)
Microalb, Ur Waived: 10 mg/L (ref 0–19)
Microalb/Creat Ratio: <30 mg/g (ref <30)

## 2024-05-06 LAB — BAYER DCA HB A1C WAIVED: HB A1C (BAYER DCA - WAIVED): 7.2 % — ABNORMAL HIGH (ref 4.8–5.6)

## 2024-05-06 MED ORDER — DAPAGLIFLOZIN PROPANEDIOL 10 MG PO TABS: ORAL_TABLET | ORAL | 1 refills | Status: AC

## 2024-05-06 MED ORDER — BENAZEPRIL HCL 40 MG PO TABS: 40.0000 mg | ORAL_TABLET | Freq: Every day | ORAL | 1 refills | Status: AC

## 2024-05-06 MED ORDER — ATORVASTATIN CALCIUM 20 MG PO TABS: 20.0000 mg | ORAL_TABLET | Freq: Every day | ORAL | 1 refills | Status: AC

## 2024-05-06 MED ORDER — METFORMIN HCL ER 500 MG PO TB24: 1000.0000 mg | ORAL_TABLET | Freq: Two times a day (BID) | ORAL | 1 refills | Status: AC

## 2024-05-06 NOTE — Assessment & Plan Note (Signed)
 Under good control on current regimen. Continue current regimen. Continue to monitor. Call with any concerns. Refills given. Labs drawn today.

## 2024-05-06 NOTE — Progress Notes (Signed)
 BP 139/87 (BP Location: Left Arm, Cuff Size: Large)   Pulse 66   Temp 98 F (36.7 C) (Oral)   Ht 6' 1 (1.854 m)   Wt (!) 300 lb 3.2 oz (136.2 kg)   BMI 39.61 kg/m    Subjective:    Patient ID: Cody Burke, male    DOB: 03-Apr-1966, 58 y.o.   MRN: [id number]  HPI: Cody Burke is a 58 y.o. male presenting on 05/06/2024 for comprehensive medical examination. Current medical complaints include:  DIABETES Hypoglycemic episodes:no Polydipsia/polyuria: no Visual disturbance: no Chest pain: no Paresthesias: no Glucose Monitoring: yes  Accucheck frequency: Daily Taking Insulin ?: no Blood Pressure Monitoring: not checking Retinal Examination: Not up to Date Foot Exam: Up to Date Diabetic Education: Completed Pneumovax: Up to Date Influenza: Up to Date Aspirin : yes  HYPERTENSION / HYPERLIPIDEMIA Satisfied with current treatment? yes Duration of hypertension: chronic BP monitoring frequency: not checking BP medication side effects: no Past BP meds: benazepril  Duration of hyperlipidemia: chronic Cholesterol medication side effects: no Cholesterol supplements: none Past cholesterol medications: atorvastatin  Medication compliance: excellent compliance Aspirin : yes Recent stressors: no Recurrent headaches: no Visual changes: no Palpitations: no Dyspnea: no Chest pain: no Lower extremity edema: no Dizzy/lightheaded: no   He currently lives with:wife Interim Problems from his last visit: no  Depression Screen done today and results listed below:     05/06/2024    8:20 AM 01/27/2024    8:11 AM 10/22/2023    8:15 AM 07/23/2023    8:13 AM 07/07/2023   10:27 AM  Depression screen PHQ 2/9  Decreased Interest 0 0 0 0 0  Down, Depressed, Hopeless 0 0 0 0 0  PHQ - 2 Score 0 0 0 0 0  Altered sleeping 0 0 0 0 0  Tired, decreased energy 0 0 0 0 0  Change in appetite 0 0 0 0 0  Feeling bad or failure about yourself  0 0 0 0 0  Trouble concentrating 0 0 0 0 0  Moving  slowly or fidgety/restless 0 0 0 0 0  Suicidal thoughts 0 0 0 0 0  PHQ-9 Score 0 0  0  0  0   Difficult doing work/chores     Not difficult at all     Data saved with a previous flowsheet row definition    Past Medical History:  Past Medical History:  Diagnosis Date   Arthritis    Benign prostatic hyperplasia with lower urinary tract symptoms    Cellulitis in diabetic foot (HCC)    Diabetic foot ulcer with osteomyelitis (HCC) 05/08/2022   Erectile dysfunction    Hammer toe of right foot    Hyperlipidemia    Hypertension    Lymphedema    Obesity    Peripheral vascular disease    Pressure injury of right foot, stage 1 11/28/2021   Severe sepsis (HCC) 05/08/2022   Last Assessment & Plan:      Resolved.     Sleep apnea    severe - new DX - has not gotten CPAP yet   Type 2 diabetes mellitus with diabetic polyneuropathy, without long-term current use of insulin  [facility])     Surgical History:  Past Surgical History:  Procedure Laterality Date   AMPUTATION Right 05/09/2022   Procedure: AMPUTATION FIRST RAY;  Surgeon: Lennie Barter, DPM;  Location: [facility];  Service: Podiatry;  Laterality: Right;   ARTHRODESIS METATARSALPHALANGEAL JOINT (MTPJ) Right 03/07/2022   Procedure: 71239 GLENWOOD MOLT TENOSUSPENSION;  Surgeon: Ashley Soulier, DPM;  Location: [facility];  Service: Podiatry;  Laterality: Right;   COLONOSCOPY WITH PROPOFOL  N/A 09/21/2015   Procedure: COLONOSCOPY WITH PROPOFOL ;  Surgeon: Rogelia Copping, MD;  Location: [facility];  Service: Endoscopy;  Laterality: N/A;  Diabetic - oral meds sleep apnea   METATARSAL HEAD EXCISION Right 07/09/2023   Procedure: 28112 & 71886 METATARSAL HEAD EXCISION X 4;  Surgeon: Lennie Barter, DPM;  Location: [facility];  Service: Orthopedics/Podiatry;  Laterality: Right;   SESMOIDECTOMY Right 03/07/2022   Procedure: SESMOIDECTOMY;  Surgeon: Ashley Soulier, DPM;  Location: [facility];  Service: Podiatry;  Laterality: Right;   VASCULAR SURGERY Bilateral     legs   WOUND DEBRIDEMENT Right 03/07/2022   Procedure: DEBRIDEMENT OF RIGHT FOOT ULCER;  Surgeon: Ashley Soulier, DPM;  Location: [facility];  Service: Podiatry;  Laterality: Right;   WOUND DEBRIDEMENT Right 07/09/2023   Procedure: 11042 - DEBRIDE SKIN SUBCUTANEOUS TISSUE;  Surgeon: Lennie Barter, DPM;  Location: [facility];  Service: Orthopedics/Podiatry;  Laterality: Right;    Medications:  Current Outpatient Medications on File Prior to Visit  Medication Sig   aspirin  EC 81 MG tablet Take 81 mg by mouth daily.   glucose blood test strip Please dispense insurance preference. Use to check BS BID   Multiple Vitamins-Minerals (MULTIVITAMIN GUMMIES ADULT PO) Take by mouth daily.   Omega-3 Fatty Acids (FISH OIL) 1000 MG CAPS Take 1,000 mg by mouth daily.   ondansetron  (ZOFRAN ) 4 MG tablet Take 1 tablet (4 mg total) by mouth every 8 (eight) hours as needed for nausea or vomiting.   silodosin  (RAPAFLO ) 8 MG CAPS capsule Take 1 capsule (8 mg total) by mouth daily with breakfast.   SODIUM FLUORIDE 5000 PPM 1.1 % GEL dental gel Place 1 Application onto teeth at bedtime. (Patient taking differently: Place 1 Application onto teeth at bedtime. AS NEEDED)   No current facility-administered medications on file prior to visit.    Allergies:  Allergies[1]  Social History:  Social History   Socioeconomic History   Marital status: Married    Spouse name: Lindie   Number of children: 2   Years of education: Not on file   Highest education level: Associate degree: occupational, scientist, product/process development, or vocational program  Occupational History   Not on file  Tobacco Use   Smoking status: Former    Current packs/day: 0.00    Types: Cigarettes    Quit date: 07/30/2003    Years since quitting: 20.7   Smokeless tobacco: Never  Vaping Use   Vaping status: Never Used  Substance and Sexual Activity   Alcohol use: Yes    Comment: very little   Drug use: No   Sexual activity: Not Currently  Other Topics Concern    Not on file  Social History Narrative   Not on file   Social Drivers of Health   Tobacco Use: Medium Risk (05/06/2024)   Patient History    Smoking Tobacco Use: Former    Smokeless Tobacco Use: Never    Passive Exposure: Not on Actuary Strain: Low Risk (05/06/2024)   Overall Financial Resource Strain (CARDIA)    Difficulty of Paying Living Expenses: Not very hard  Food Insecurity: No Food Insecurity (05/06/2024)   Epic    Worried About Radiation Protection Practitioner of Food in the Last Year: Never true    Ran Out of Food in the Last Year: Never true  Transportation Needs: No Transportation Needs (05/06/2024)   Epic  Lack of Transportation (Medical): No    Lack of Transportation (Non-Medical): No  Physical Activity: Inactive (05/06/2024)   Exercise Vital Sign    Days of Exercise per Week: 0 days    Minutes of Exercise per Session: 0 min  Stress: No Stress Concern Present (05/06/2024)   [employer] of Occupational Health - Occupational Stress Questionnaire    Feeling of Stress: Only a little  Social Connections: Socially Integrated (05/06/2024)   Social Connection and Isolation Panel    Frequency of Communication with Friends and Family: More than three times a week    Frequency of Social Gatherings with Friends and Family: More than three times a week    Attends Religious Services: More than 4 times per year    Active Member of Clubs or Organizations: Yes    Attends Banker Meetings: More than 4 times per year    Marital Status: Married  Catering Manager Violence: Not At Risk (05/06/2024)   Epic    Fear of Current or Ex-Partner: No    Emotionally Abused: No    Physically Abused: No    Sexually Abused: No  Depression (PHQ2-9): Low Risk (05/06/2024)   Depression (PHQ2-9)    PHQ-2 Score: 0  Alcohol Screen: Low Risk (05/06/2024)   Alcohol Screen    Last Alcohol Screening Score (AUDIT): 0  Housing: Low Risk (05/06/2024)   Epic    Unable to Pay for  Housing in the Last Year: No    Number of Times Moved in the Last Year: 0    Homeless in the Last Year: No  Utilities: Not At Risk (05/06/2024)   Epic    Threatened with loss of utilities: No  Health Literacy: Adequate Health Literacy (05/06/2024)   B1300 Health Literacy    Frequency of need for help with medical instructions: Never   Tobacco Use History[2] Social History   Substance and Sexual Activity  Alcohol Use Yes   Comment: very little    Family History:  Family History  Problem Relation Age of Onset   Cancer Mother    Hypertension Father    Arthritis Father    COPD Father     Past medical history, surgical history, medications, allergies, family history and social history reviewed with patient today and changes made to appropriate areas of the chart.   Review of Systems  Constitutional: Negative.   HENT: Negative.    Eyes: Negative.   Respiratory: Negative.    Cardiovascular: Negative.   Gastrointestinal: Negative.   Genitourinary: Negative.   Musculoskeletal: Negative.   Skin: Negative.   Neurological: Negative.   Endo/Heme/Allergies:  Negative for environmental allergies and polydipsia. Bruises/bleeds easily.  Psychiatric/Behavioral: Negative.     All other ROS negative except what is listed above and in the HPI.      Objective:    BP 139/87 (BP Location: Left Arm, Cuff Size: Large)   Pulse 66   Temp 98 F (36.7 C) (Oral)   Ht 6' 1 (1.854 m)   Wt (!) 300 lb 3.2 oz (136.2 kg)   BMI 39.61 kg/m   Wt Readings from Last 3 Encounters:  05/06/24 (!) 300 lb 3.2 oz (136.2 kg)  01/27/24 (!) 304 lb 12.8 oz (138.3 kg)  10/22/23 (!) 302 lb 6.4 oz (137.2 kg)    Physical Exam Vitals and nursing note reviewed.  Constitutional:      General: He is not in acute distress.    Appearance: Normal appearance. He is obese. He is  not ill-appearing, toxic-appearing or diaphoretic.  HENT:     Head: Normocephalic and atraumatic.     Right Ear: Tympanic membrane, ear  canal and external ear normal. There is no impacted cerumen.     Left Ear: Tympanic membrane, ear canal and external ear normal. There is no impacted cerumen.     Nose: Nose normal. No congestion or rhinorrhea.     Mouth/Throat:     Mouth: Mucous membranes are moist.     Pharynx: Oropharynx is clear. No oropharyngeal exudate or posterior oropharyngeal erythema.  Eyes:     General: No scleral icterus.       Right eye: No discharge.        Left eye: No discharge.     Extraocular Movements: Extraocular movements intact.     Conjunctiva/sclera: Conjunctivae normal.     Pupils: Pupils are equal, round, and reactive to light.  Neck:     Vascular: No carotid bruit.  Cardiovascular:     Rate and Rhythm: Normal rate and regular rhythm.     Pulses: Normal pulses.     Heart sounds: No murmur heard.    No friction rub. No gallop.  Pulmonary:     Effort: Pulmonary effort is normal. No respiratory distress.     Breath sounds: Normal breath sounds. No stridor. No wheezing, rhonchi or rales.  Chest:     Chest wall: No tenderness.  Abdominal:     General: Abdomen is flat. Bowel sounds are normal. There is no distension.     Palpations: Abdomen is soft. There is no mass.     Tenderness: There is no abdominal tenderness. There is no right CVA tenderness, left CVA tenderness, guarding or rebound.     Hernia: No hernia is present.  Genitourinary:    Comments: Genital exam deferred with shared decision making Musculoskeletal:        General: No swelling, tenderness, deformity or signs of injury.     Cervical back: Normal range of motion and neck supple. No rigidity. No muscular tenderness.     Right lower leg: No edema.     Left lower leg: No edema.  Lymphadenopathy:     Cervical: No cervical adenopathy.  Skin:    General: Skin is warm and dry.     Capillary Refill: Capillary refill takes less than 2 seconds.     Coloration: Skin is not jaundiced or pale.     Findings: No bruising, erythema,  lesion or rash.  Neurological:     General: No focal deficit present.     Mental Status: He is alert and oriented to person, place, and time.     Cranial Nerves: No cranial nerve deficit.     Sensory: No sensory deficit.     Motor: No weakness.     Coordination: Coordination normal.     Gait: Gait normal.     Deep Tendon Reflexes: Reflexes normal.  Psychiatric:        Mood and Affect: Mood normal.        Behavior: Behavior normal.        Thought Content: Thought content normal.        Judgment: Judgment normal.     Results for orders placed or performed in visit on 01/27/24  Bayer DCA Hb A1c Waived   Collection Time: 01/27/24  8:32 AM  Result Value Ref Range   HB A1C (BAYER DCA - WAIVED) 7.1 (H) 4.8 - 5.6 %  Hepatitis B surface antibody,quantitative  Collection Time: 01/27/24  8:32 AM  Result Value Ref Range   Hepatitis B Surf Ab Quant <3.5 (L) Immunity>10 mIU/mL      Assessment & Plan:   Problem List Items Addressed This Visit       Cardiovascular and Mediastinum   Hypertension   Under good control on current regimen. Continue current regimen. Continue to monitor. Call with any concerns. Refills given. Labs drawn today.       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 Continue current regimen. Continue to monitor. Call with any concerns. Refills given. Labs drawn today.        Relevant Medications   atorvastatin  (LIPITOR) 20 MG  tablet   benazepril  (LOTENSIN ) 40 MG tablet   Other Visit Diagnoses       Routine general medical examination at a health care facility    -  Primary   Vaccines up to date. Screening labs checked today. Colonoscopy up to date. Continue diet and exercise. Call with any concerns.   Relevant Orders   Comprehensive metabolic panel with GFR   CBC with Differential/Platelet   Lipid Panel w/o Chol/HDL Ratio   PSA   TSH     Need for pneumococcal vaccination       Prevnar given today.   Relevant Orders   Pneumococcal conjugate vaccine 20-valent (Prevnar 20)        LABORATORY TESTING:  Health maintenance labs ordered today as discussed above.   The natural history of prostate cancer and ongoing controversy regarding screening and potential treatment outcomes of prostate cancer has been discussed with the patient. The meaning of a false positive PSA and a false negative PSA has been discussed. He indicates understanding of the limitations of this screening test and wishes to proceed with screening PSA testing.   IMMUNIZATIONS:   - Tdap: Tetanus vaccination status reviewed: last tetanus booster within 10 years. - Influenza: Up to date - Pneumovax: Up to date - Prevnar: Administered today - COVID: Refused - HPV: Not applicable - Shingrix vaccine: Refused  SCREENING: - Colonoscopy: Up to date  Discussed with patient purpose of the colonoscopy is to detect colon cancer at curable precancerous or early stages   PATIENT COUNSELING:    Sexuality: Discussed sexually transmitted diseases, partner selection, use of condoms, avoidance of unintended pregnancy  and contraceptive alternatives.   Advised to avoid cigarette smoking.  I discussed with the patient that most people either abstain from alcohol or drink within safe limits (<=14/week and <=4 drinks/occasion for males, <=7/weeks and <= 3 drinks/occasion for females) and that the risk for alcohol disorders and other health effects rises  proportionally with the number of drinks per week and how often a drinker exceeds daily limits.  Discussed cessation/primary prevention of drug use and availability of treatment for abuse.   Diet: Encouraged to adjust caloric intake to maintain  or achieve ideal body weight, to  reduce intake of dietary saturated fat and total fat, to limit sodium intake by avoiding high sodium foods and not adding table salt, and to maintain adequate dietary potassium and calcium  preferably from fresh fruits, vegetables, and low-fat dairy products.    stressed the importance of regular exercise  Injury prevention: Discussed safety belts, safety helmets, smoke detector, smoking near bedding or upholstery.   Dental health: Discussed importance of regular tooth brushing, flossing, and dental visits.   Follow up plan: NEXT PREVENTATIVE PHYSICAL DUE IN 1 YEAR. Return in about 3 months (around 08/04/2024).     [1] No Known Allergies [2]  Social History Tobacco Use  Smoking Status Former   Current packs/day: 0.00   Types: Cigarettes   Quit date: 07/30/2003   Years since quitting: 20.7  Smokeless Tobacco Never

## 2024-05-07 LAB — COMPREHENSIVE METABOLIC PANEL WITH GFR
ALT: 52 IU/L — ABNORMAL HIGH (ref 0–44)
AST: 42 IU/L — ABNORMAL HIGH (ref 0–40)
Albumin: 4.1 g/dL (ref 3.8–4.9)
Alkaline Phosphatase: 89 IU/L (ref 47–123)
BUN/Creatinine Ratio: 17 (ref 10–24)
BUN: 16 mg/dL (ref 8–27)
Bilirubin Total: 0.5 mg/dL (ref 0.0–1.2)
CO2: 23 mmol/L (ref 20–29)
Calcium: 9.4 mg/dL (ref 8.6–10.2)
Chloride: 99 mmol/L (ref 96–106)
Creatinine, Ser: 0.96 mg/dL (ref 0.76–1.27)
Globulin, Total: 2.7 g/dL (ref 1.5–4.5)
Glucose: 142 mg/dL — ABNORMAL HIGH (ref 70–99)
Potassium: 4.8 mmol/L (ref 3.5–5.2)
Sodium: 135 mmol/L (ref 134–144)
Total Protein: 6.8 g/dL (ref 6.0–8.5)
eGFR: 90 mL/min/1.73 (ref >59)

## 2024-05-07 LAB — CBC WITH DIFFERENTIAL/PLATELET
Basophils Absolute: 0.1 x10E3/uL (ref 0.0–0.2)
Basos: 1 %
EOS (ABSOLUTE): 0.4 x10E3/uL (ref 0.0–0.4)
Eos: 5 %
Hematocrit: 52.5 % — ABNORMAL HIGH (ref 37.5–51.0)
Hemoglobin: 17 g/dL (ref 13.0–17.7)
Immature Grans (Abs): 0.1 x10E3/uL (ref 0.0–0.1)
Immature Granulocytes: 1 %
Lymphocytes Absolute: 4.1 x10E3/uL — ABNORMAL HIGH (ref 0.7–3.1)
Lymphs: 50 %
MCH: 29.8 pg (ref 26.6–33.0)
MCHC: 32.4 g/dL (ref 31.5–35.7)
MCV: 92 fL (ref 79–97)
Monocytes Absolute: 0.7 x10E3/uL (ref 0.1–0.9)
Monocytes: 9 %
Neutrophils Absolute: 2.8 x10E3/uL (ref 1.4–7.0)
Neutrophils: 34 %
Platelets: 214 x10E3/uL (ref 150–450)
RBC: 5.7 x10E6/uL (ref 4.14–5.80)
RDW: 13.2 % (ref 11.6–15.4)
WBC: 8.1 x10E3/uL (ref 3.4–10.8)

## 2024-05-07 LAB — LIPID PANEL W/O CHOL/HDL RATIO
Cholesterol, Total: 124 mg/dL (ref 100–199)
HDL: 37 mg/dL — ABNORMAL LOW (ref >39)
LDL Chol Calc (NIH): 46 mg/dL (ref 0–99)
Triglycerides: 265 mg/dL — ABNORMAL HIGH (ref 0–149)
VLDL Cholesterol Cal: 41 mg/dL — ABNORMAL HIGH (ref 5–40)

## 2024-05-07 LAB — PSA: Prostate Specific Ag, Serum: <0.1 ng/mL (ref 0.0–4.0)

## 2024-05-07 LAB — TSH: TSH: 3.46 u[IU]/mL (ref 0.450–4.500)

## 2024-05-09 ENCOUNTER — Ambulatory Visit: Payer: Self-pay | Admitting: Family Medicine

## 2024-05-10 LAB — HM DIABETES EYE EXAM

## 2024-05-13 ENCOUNTER — Encounter: Payer: Self-pay | Admitting: Family Medicine

## 2024-05-17 ENCOUNTER — Encounter: Payer: Self-pay | Admitting: Family Medicine

## 2024-05-18 ENCOUNTER — Encounter: Payer: Self-pay | Admitting: Family Medicine

## 2024-08-05 ENCOUNTER — Ambulatory Visit: Admitting: Family Medicine

## 2024-09-30 ENCOUNTER — Ambulatory Visit: Admitting: Urology
